# Patient Record
Sex: Female | Born: 1945
Health system: Southern US, Community
[De-identification: ages and names within clinical notes are randomized; demographics above are authoritative.]

## PROBLEM LIST (undated history)

## (undated) DIAGNOSIS — I10 Essential (primary) hypertension: Secondary | ICD-10-CM

## (undated) HISTORY — PX: ABDOMINAL HYSTERECTOMY: SHX81

## (undated) HISTORY — PX: APPENDECTOMY: SHX54

## (undated) HISTORY — PX: ADENOIDECTOMY: SUR15

## (undated) HISTORY — PX: TONSILLECTOMY: SUR1361

---

## 2002-04-16 ENCOUNTER — Emergency Department (HOSPITAL_COMMUNITY): Admission: EM | Admit: 2002-04-16 | Discharge: 2002-04-16 | Payer: Self-pay | Admitting: Emergency Medicine

## 2002-04-16 ENCOUNTER — Encounter: Payer: Self-pay | Admitting: Emergency Medicine

## 2007-08-15 ENCOUNTER — Ambulatory Visit: Payer: Self-pay | Admitting: Internal Medicine

## 2007-08-26 ENCOUNTER — Ambulatory Visit: Payer: Self-pay | Admitting: Internal Medicine

## 2009-03-09 ENCOUNTER — Ambulatory Visit (HOSPITAL_BASED_OUTPATIENT_CLINIC_OR_DEPARTMENT_OTHER): Admission: RE | Admit: 2009-03-09 | Discharge: 2009-03-09 | Payer: Self-pay | Admitting: Pediatrics

## 2009-03-09 ENCOUNTER — Ambulatory Visit: Payer: Self-pay | Admitting: Diagnostic Radiology

## 2011-01-13 ENCOUNTER — Other Ambulatory Visit (HOSPITAL_COMMUNITY): Payer: Self-pay | Admitting: Neurosurgery

## 2011-01-13 DIAGNOSIS — M5416 Radiculopathy, lumbar region: Secondary | ICD-10-CM

## 2011-01-17 ENCOUNTER — Other Ambulatory Visit (HOSPITAL_COMMUNITY): Payer: Self-pay | Admitting: Neurosurgery

## 2011-01-17 ENCOUNTER — Ambulatory Visit (HOSPITAL_COMMUNITY)
Admit: 2011-01-17 | Discharge: 2011-01-17 | Disposition: A | Discharge status: Home / Self Care | Payer: Medicare Other | Attending: Neurosurgery | Admitting: Neurosurgery

## 2011-01-17 ENCOUNTER — Other Ambulatory Visit (HOSPITAL_COMMUNITY): Payer: Medicare Other

## 2011-01-17 ENCOUNTER — Ambulatory Visit (HOSPITAL_COMMUNITY)
Admission: RE | Admit: 2011-01-17 | Discharge: 2011-01-17 | Disposition: A | Discharge status: Home / Self Care | Payer: Medicare Other | Source: Ambulatory Visit | Attending: Neurosurgery | Admitting: Neurosurgery

## 2011-01-17 DIAGNOSIS — M5416 Radiculopathy, lumbar region: Secondary | ICD-10-CM

## 2011-01-17 DIAGNOSIS — M216X9 Other acquired deformities of unspecified foot: Secondary | ICD-10-CM | POA: Insufficient documentation

## 2011-01-17 DIAGNOSIS — M47817 Spondylosis without myelopathy or radiculopathy, lumbosacral region: Secondary | ICD-10-CM | POA: Insufficient documentation

## 2011-01-17 DIAGNOSIS — G609 Hereditary and idiopathic neuropathy, unspecified: Secondary | ICD-10-CM | POA: Insufficient documentation

## 2011-01-17 LAB — BASIC METABOLIC PANEL
BUN: 14 mg/dL (ref 6–23)
CO2: 28 mEq/L (ref 19–32)
Calcium: 9.8 mg/dL (ref 8.4–10.5)
Chloride: 105 mEq/L (ref 96–112)
Creatinine, Ser: 0.63 mg/dL (ref 0.4–1.2)
GFR calc Af Amer: >60 mL/min (ref >60)
GFR calc non Af Amer: >60 mL/min (ref >60)
Glucose, Bld: 110 mg/dL — ABNORMAL HIGH (ref 70–99)
Potassium: 3.5 mEq/L (ref 3.5–5.1)
Sodium: 143 mEq/L (ref 135–145)

## 2011-01-17 LAB — CBC
HCT: 36.1 % (ref 36.0–46.0)
Hemoglobin: 13.1 g/dL (ref 12.0–15.0)
MCH: 29.8 pg (ref 26.0–34.0)
MCHC: 36.3 g/dL — ABNORMAL HIGH (ref 30.0–36.0)
MCV: 82 fL (ref 78.0–100.0)
Platelets: 168 10*3/uL (ref 150–400)
RBC: 4.4 MIL/uL (ref 3.87–5.11)
RDW: 11.8 % (ref 11.5–15.5)
WBC: 5.8 10*3/uL (ref 4.0–10.5)

## 2011-01-17 LAB — DIFFERENTIAL
Basophils Absolute: 0 10*3/uL (ref 0.0–0.1)
Basophils Relative: 1 % (ref 0–1)
Eosinophils Absolute: 0.1 10*3/uL (ref 0.0–0.7)
Eosinophils Relative: 2 % (ref 0–5)
Lymphocytes Relative: 26 % (ref 12–46)
Lymphs Abs: 1.5 10*3/uL (ref 0.7–4.0)
Monocytes Absolute: 0.2 10*3/uL (ref 0.1–1.0)
Monocytes Relative: 4 % (ref 3–12)
Neutro Abs: 4 10*3/uL (ref 1.7–7.7)
Neutrophils Relative %: 68 % (ref 43–77)

## 2011-01-17 LAB — TYPE AND SCREEN
ABO/RH(D): O NEG
Antibody Screen: NEGATIVE

## 2011-01-17 LAB — ABO/RH: ABO/RH(D): O NEG

## 2011-01-17 MED ORDER — IOHEXOL 180 MG/ML  SOLN
25.0000 mL | Freq: Once | INTRAMUSCULAR | Status: AC | PRN
  Administered 2011-01-17: 25 mL via INTRATHECAL

## 2011-01-17 MED ORDER — GADOBENATE DIMEGLUMINE 529 MG/ML IV SOLN
18.0000 mL | Freq: Once | INTRAVENOUS | Status: AC | PRN
  Administered 2011-01-17: 18 mL via INTRAVENOUS

## 2011-01-18 ENCOUNTER — Other Ambulatory Visit (HOSPITAL_COMMUNITY): Payer: Medicare Other

## 2011-03-30 ENCOUNTER — Other Ambulatory Visit: Payer: Self-pay | Admitting: Neurology

## 2011-03-30 DIAGNOSIS — M549 Dorsalgia, unspecified: Secondary | ICD-10-CM

## 2011-05-01 ENCOUNTER — Ambulatory Visit
Admission: RE | Admit: 2011-05-01 | Discharge: 2011-05-01 | Disposition: A | Discharge status: Home / Self Care | Payer: Medicare Other | Source: Ambulatory Visit | Attending: Neurology | Admitting: Neurology

## 2011-05-01 DIAGNOSIS — M549 Dorsalgia, unspecified: Secondary | ICD-10-CM

## 2011-05-01 MED ORDER — GADOBENATE DIMEGLUMINE 529 MG/ML IV SOLN: 20.0000 mL | Freq: Once | INTRAVENOUS | Status: AC | PRN

## 2011-09-26 DIAGNOSIS — M48062 Spinal stenosis, lumbar region with neurogenic claudication: Secondary | ICD-10-CM | POA: Insufficient documentation

## 2015-09-15 DIAGNOSIS — Z1211 Encounter for screening for malignant neoplasm of colon: Secondary | ICD-10-CM | POA: Diagnosis not present

## 2015-09-15 DIAGNOSIS — I10 Essential (primary) hypertension: Secondary | ICD-10-CM | POA: Diagnosis not present

## 2015-09-15 DIAGNOSIS — R05 Cough: Secondary | ICD-10-CM | POA: Diagnosis not present

## 2015-09-15 DIAGNOSIS — E785 Hyperlipidemia, unspecified: Secondary | ICD-10-CM | POA: Diagnosis not present

## 2015-09-15 DIAGNOSIS — E559 Vitamin D deficiency, unspecified: Secondary | ICD-10-CM | POA: Diagnosis not present

## 2015-09-21 DIAGNOSIS — Z1231 Encounter for screening mammogram for malignant neoplasm of breast: Secondary | ICD-10-CM | POA: Diagnosis not present

## 2015-09-28 DIAGNOSIS — I10 Essential (primary) hypertension: Secondary | ICD-10-CM | POA: Diagnosis not present

## 2015-09-30 DIAGNOSIS — E785 Hyperlipidemia, unspecified: Secondary | ICD-10-CM | POA: Diagnosis not present

## 2015-09-30 DIAGNOSIS — R8290 Unspecified abnormal findings in urine: Secondary | ICD-10-CM | POA: Diagnosis not present

## 2015-10-14 DIAGNOSIS — M5137 Other intervertebral disc degeneration, lumbosacral region: Secondary | ICD-10-CM | POA: Diagnosis not present

## 2015-10-14 DIAGNOSIS — G609 Hereditary and idiopathic neuropathy, unspecified: Secondary | ICD-10-CM | POA: Diagnosis not present

## 2016-01-13 DIAGNOSIS — M5137 Other intervertebral disc degeneration, lumbosacral region: Secondary | ICD-10-CM | POA: Diagnosis not present

## 2016-01-13 DIAGNOSIS — G609 Hereditary and idiopathic neuropathy, unspecified: Secondary | ICD-10-CM | POA: Diagnosis not present

## 2016-01-14 DIAGNOSIS — M21372 Foot drop, left foot: Secondary | ICD-10-CM | POA: Diagnosis not present

## 2016-01-14 DIAGNOSIS — G609 Hereditary and idiopathic neuropathy, unspecified: Secondary | ICD-10-CM | POA: Diagnosis not present

## 2016-03-13 DIAGNOSIS — I10 Essential (primary) hypertension: Secondary | ICD-10-CM | POA: Diagnosis not present

## 2016-03-13 DIAGNOSIS — E785 Hyperlipidemia, unspecified: Secondary | ICD-10-CM | POA: Diagnosis not present

## 2016-03-13 DIAGNOSIS — E669 Obesity, unspecified: Secondary | ICD-10-CM | POA: Diagnosis not present

## 2016-03-13 DIAGNOSIS — R21 Rash and other nonspecific skin eruption: Secondary | ICD-10-CM | POA: Diagnosis not present

## 2016-04-18 DIAGNOSIS — H2513 Age-related nuclear cataract, bilateral: Secondary | ICD-10-CM | POA: Diagnosis not present

## 2016-04-18 DIAGNOSIS — H02831 Dermatochalasis of right upper eyelid: Secondary | ICD-10-CM | POA: Diagnosis not present

## 2016-04-18 DIAGNOSIS — H43393 Other vitreous opacities, bilateral: Secondary | ICD-10-CM | POA: Diagnosis not present

## 2016-04-18 DIAGNOSIS — H5203 Hypermetropia, bilateral: Secondary | ICD-10-CM | POA: Diagnosis not present

## 2016-05-04 DIAGNOSIS — M4806 Spinal stenosis, lumbar region: Secondary | ICD-10-CM | POA: Diagnosis not present

## 2016-05-04 DIAGNOSIS — M545 Low back pain: Secondary | ICD-10-CM | POA: Diagnosis not present

## 2016-05-04 DIAGNOSIS — G959 Disease of spinal cord, unspecified: Secondary | ICD-10-CM | POA: Diagnosis not present

## 2016-05-04 DIAGNOSIS — G609 Hereditary and idiopathic neuropathy, unspecified: Secondary | ICD-10-CM | POA: Diagnosis not present

## 2016-07-06 DIAGNOSIS — L97512 Non-pressure chronic ulcer of other part of right foot with fat layer exposed: Secondary | ICD-10-CM | POA: Diagnosis not present

## 2016-07-20 DIAGNOSIS — L97512 Non-pressure chronic ulcer of other part of right foot with fat layer exposed: Secondary | ICD-10-CM | POA: Diagnosis not present

## 2016-07-20 DIAGNOSIS — G629 Polyneuropathy, unspecified: Secondary | ICD-10-CM | POA: Diagnosis not present

## 2016-08-09 DIAGNOSIS — M545 Low back pain: Secondary | ICD-10-CM | POA: Diagnosis not present

## 2016-08-09 DIAGNOSIS — G609 Hereditary and idiopathic neuropathy, unspecified: Secondary | ICD-10-CM | POA: Diagnosis not present

## 2016-08-09 DIAGNOSIS — M25551 Pain in right hip: Secondary | ICD-10-CM | POA: Diagnosis not present

## 2016-08-09 DIAGNOSIS — M48062 Spinal stenosis, lumbar region with neurogenic claudication: Secondary | ICD-10-CM | POA: Diagnosis not present

## 2016-08-09 DIAGNOSIS — S79911A Unspecified injury of right hip, initial encounter: Secondary | ICD-10-CM | POA: Diagnosis not present

## 2016-08-15 DIAGNOSIS — I10 Essential (primary) hypertension: Secondary | ICD-10-CM | POA: Diagnosis not present

## 2016-08-15 DIAGNOSIS — R269 Unspecified abnormalities of gait and mobility: Secondary | ICD-10-CM | POA: Diagnosis not present

## 2016-08-15 DIAGNOSIS — H6121 Impacted cerumen, right ear: Secondary | ICD-10-CM | POA: Diagnosis not present

## 2016-08-15 DIAGNOSIS — Z1159 Encounter for screening for other viral diseases: Secondary | ICD-10-CM | POA: Diagnosis not present

## 2016-08-15 DIAGNOSIS — Z0000 Encounter for general adult medical examination without abnormal findings: Secondary | ICD-10-CM | POA: Diagnosis not present

## 2016-08-15 DIAGNOSIS — E785 Hyperlipidemia, unspecified: Secondary | ICD-10-CM | POA: Diagnosis not present

## 2016-08-18 DIAGNOSIS — R269 Unspecified abnormalities of gait and mobility: Secondary | ICD-10-CM | POA: Diagnosis not present

## 2016-08-22 DIAGNOSIS — R2681 Unsteadiness on feet: Secondary | ICD-10-CM | POA: Diagnosis not present

## 2016-08-22 DIAGNOSIS — M545 Low back pain: Secondary | ICD-10-CM | POA: Diagnosis not present

## 2016-08-23 DIAGNOSIS — M5137 Other intervertebral disc degeneration, lumbosacral region: Secondary | ICD-10-CM | POA: Diagnosis not present

## 2016-09-05 DIAGNOSIS — R2681 Unsteadiness on feet: Secondary | ICD-10-CM | POA: Diagnosis not present

## 2016-09-05 DIAGNOSIS — M545 Low back pain: Secondary | ICD-10-CM | POA: Diagnosis not present

## 2016-09-14 DIAGNOSIS — M5137 Other intervertebral disc degeneration, lumbosacral region: Secondary | ICD-10-CM | POA: Diagnosis not present

## 2016-09-14 DIAGNOSIS — Z79899 Other long term (current) drug therapy: Secondary | ICD-10-CM | POA: Diagnosis not present

## 2016-09-14 DIAGNOSIS — G609 Hereditary and idiopathic neuropathy, unspecified: Secondary | ICD-10-CM | POA: Diagnosis not present

## 2016-09-14 DIAGNOSIS — M48062 Spinal stenosis, lumbar region with neurogenic claudication: Secondary | ICD-10-CM | POA: Diagnosis not present

## 2016-09-26 DIAGNOSIS — R2681 Unsteadiness on feet: Secondary | ICD-10-CM | POA: Diagnosis not present

## 2016-09-26 DIAGNOSIS — R531 Weakness: Secondary | ICD-10-CM | POA: Diagnosis not present

## 2016-09-26 DIAGNOSIS — R293 Abnormal posture: Secondary | ICD-10-CM | POA: Diagnosis not present

## 2016-09-26 DIAGNOSIS — M545 Low back pain: Secondary | ICD-10-CM | POA: Diagnosis not present

## 2016-09-26 DIAGNOSIS — Z7409 Other reduced mobility: Secondary | ICD-10-CM | POA: Diagnosis not present

## 2016-09-26 DIAGNOSIS — R2689 Other abnormalities of gait and mobility: Secondary | ICD-10-CM | POA: Diagnosis not present

## 2016-10-24 DIAGNOSIS — R2689 Other abnormalities of gait and mobility: Secondary | ICD-10-CM | POA: Diagnosis not present

## 2016-10-24 DIAGNOSIS — M545 Low back pain: Secondary | ICD-10-CM | POA: Diagnosis not present

## 2016-10-31 DIAGNOSIS — Z1231 Encounter for screening mammogram for malignant neoplasm of breast: Secondary | ICD-10-CM | POA: Diagnosis not present

## 2016-11-28 DIAGNOSIS — I10 Essential (primary) hypertension: Secondary | ICD-10-CM | POA: Diagnosis not present

## 2016-11-28 DIAGNOSIS — E785 Hyperlipidemia, unspecified: Secondary | ICD-10-CM | POA: Diagnosis not present

## 2016-11-28 DIAGNOSIS — Z1159 Encounter for screening for other viral diseases: Secondary | ICD-10-CM | POA: Diagnosis not present

## 2016-11-29 DIAGNOSIS — I10 Essential (primary) hypertension: Secondary | ICD-10-CM | POA: Diagnosis not present

## 2016-12-07 DIAGNOSIS — Z79899 Other long term (current) drug therapy: Secondary | ICD-10-CM | POA: Diagnosis not present

## 2016-12-07 DIAGNOSIS — M48062 Spinal stenosis, lumbar region with neurogenic claudication: Secondary | ICD-10-CM | POA: Diagnosis not present

## 2016-12-07 DIAGNOSIS — M545 Low back pain: Secondary | ICD-10-CM | POA: Diagnosis not present

## 2016-12-28 DIAGNOSIS — L89301 Pressure ulcer of unspecified buttock, stage 1: Secondary | ICD-10-CM | POA: Diagnosis not present

## 2016-12-28 DIAGNOSIS — L304 Erythema intertrigo: Secondary | ICD-10-CM | POA: Diagnosis not present

## 2016-12-28 DIAGNOSIS — L570 Actinic keratosis: Secondary | ICD-10-CM | POA: Diagnosis not present

## 2017-01-05 DIAGNOSIS — G720 Drug-induced myopathy: Secondary | ICD-10-CM | POA: Diagnosis not present

## 2017-01-05 DIAGNOSIS — M48062 Spinal stenosis, lumbar region with neurogenic claudication: Secondary | ICD-10-CM | POA: Diagnosis not present

## 2017-01-05 DIAGNOSIS — M545 Low back pain: Secondary | ICD-10-CM | POA: Diagnosis not present

## 2017-01-05 DIAGNOSIS — M5416 Radiculopathy, lumbar region: Secondary | ICD-10-CM | POA: Diagnosis not present

## 2017-02-09 DIAGNOSIS — I10 Essential (primary) hypertension: Secondary | ICD-10-CM | POA: Diagnosis not present

## 2017-02-12 DIAGNOSIS — E785 Hyperlipidemia, unspecified: Secondary | ICD-10-CM | POA: Diagnosis not present

## 2017-02-12 DIAGNOSIS — L89301 Pressure ulcer of unspecified buttock, stage 1: Secondary | ICD-10-CM | POA: Diagnosis not present

## 2017-02-12 DIAGNOSIS — Z23 Encounter for immunization: Secondary | ICD-10-CM | POA: Diagnosis not present

## 2017-02-12 DIAGNOSIS — I10 Essential (primary) hypertension: Secondary | ICD-10-CM | POA: Diagnosis not present

## 2017-02-12 DIAGNOSIS — R269 Unspecified abnormalities of gait and mobility: Secondary | ICD-10-CM | POA: Diagnosis not present

## 2017-02-12 DIAGNOSIS — D649 Anemia, unspecified: Secondary | ICD-10-CM | POA: Diagnosis not present

## 2017-02-12 DIAGNOSIS — D519 Vitamin B12 deficiency anemia, unspecified: Secondary | ICD-10-CM | POA: Diagnosis not present

## 2017-02-19 ENCOUNTER — Observation Stay (HOSPITAL_COMMUNITY)
Admission: EM | Admit: 2017-02-19 | Discharge: 2017-02-21 | Disposition: A | Discharge status: Home / Self Care | Payer: PPO | Attending: Family Medicine | Admitting: Family Medicine

## 2017-02-19 ENCOUNTER — Emergency Department (HOSPITAL_COMMUNITY): Payer: PPO

## 2017-02-19 ENCOUNTER — Encounter (HOSPITAL_COMMUNITY): Payer: Self-pay | Admitting: Family Medicine

## 2017-02-19 DIAGNOSIS — M549 Dorsalgia, unspecified: Secondary | ICD-10-CM

## 2017-02-19 DIAGNOSIS — Y939 Activity, unspecified: Secondary | ICD-10-CM | POA: Insufficient documentation

## 2017-02-19 DIAGNOSIS — Y92009 Unspecified place in unspecified non-institutional (private) residence as the place of occurrence of the external cause: Secondary | ICD-10-CM | POA: Diagnosis not present

## 2017-02-19 DIAGNOSIS — M545 Low back pain, unspecified: Secondary | ICD-10-CM | POA: Diagnosis present

## 2017-02-19 DIAGNOSIS — T148XXA Other injury of unspecified body region, initial encounter: Secondary | ICD-10-CM | POA: Diagnosis not present

## 2017-02-19 DIAGNOSIS — Y999 Unspecified external cause status: Secondary | ICD-10-CM | POA: Insufficient documentation

## 2017-02-19 DIAGNOSIS — I10 Essential (primary) hypertension: Secondary | ICD-10-CM | POA: Diagnosis present

## 2017-02-19 DIAGNOSIS — R52 Pain, unspecified: Secondary | ICD-10-CM

## 2017-02-19 DIAGNOSIS — Z79899 Other long term (current) drug therapy: Secondary | ICD-10-CM | POA: Insufficient documentation

## 2017-02-19 DIAGNOSIS — M25551 Pain in right hip: Secondary | ICD-10-CM | POA: Diagnosis not present

## 2017-02-19 DIAGNOSIS — S3992XA Unspecified injury of lower back, initial encounter: Secondary | ICD-10-CM | POA: Diagnosis not present

## 2017-02-19 DIAGNOSIS — M79604 Pain in right leg: Secondary | ICD-10-CM | POA: Diagnosis not present

## 2017-02-19 DIAGNOSIS — M79606 Pain in leg, unspecified: Secondary | ICD-10-CM | POA: Diagnosis present

## 2017-02-19 DIAGNOSIS — G629 Polyneuropathy, unspecified: Secondary | ICD-10-CM | POA: Insufficient documentation

## 2017-02-19 DIAGNOSIS — W06XXXA Fall from bed, initial encounter: Secondary | ICD-10-CM | POA: Insufficient documentation

## 2017-02-19 DIAGNOSIS — G8929 Other chronic pain: Secondary | ICD-10-CM | POA: Diagnosis present

## 2017-02-19 DIAGNOSIS — W19XXXA Unspecified fall, initial encounter: Secondary | ICD-10-CM

## 2017-02-19 DIAGNOSIS — S79911A Unspecified injury of right hip, initial encounter: Secondary | ICD-10-CM | POA: Diagnosis not present

## 2017-02-19 HISTORY — DX: Essential (primary) hypertension: I10

## 2017-02-19 LAB — CBC
HCT: 31.6 % — ABNORMAL LOW (ref 36.0–46.0)
HEMOGLOBIN: 11.1 g/dL — AB (ref 12.0–15.0)
MCH: 30 pg (ref 26.0–34.0)
MCHC: 35.1 g/dL (ref 30.0–36.0)
MCV: 85.4 fL (ref 78.0–100.0)
Platelets: 183 10*3/uL (ref 150–400)
RBC: 3.7 MIL/uL — ABNORMAL LOW (ref 3.87–5.11)
RDW: 12.8 % (ref 11.5–15.5)
WBC: 7.7 10*3/uL (ref 4.0–10.5)

## 2017-02-19 LAB — BASIC METABOLIC PANEL
Anion gap: 9 (ref 5–15)
BUN: 23 mg/dL — AB (ref 6–20)
CHLORIDE: 104 mmol/L (ref 101–111)
CO2: 25 mmol/L (ref 22–32)
Calcium: 8.8 mg/dL — ABNORMAL LOW (ref 8.9–10.3)
Creatinine, Ser: 0.7 mg/dL (ref 0.44–1.00)
GFR calc Af Amer: >60 mL/min (ref >60)
GFR calc non Af Amer: >60 mL/min (ref >60)
GLUCOSE: 104 mg/dL — AB (ref 65–99)
Potassium: 3.7 mmol/L (ref 3.5–5.1)
SODIUM: 138 mmol/L (ref 135–145)

## 2017-02-19 MED ORDER — HYDROMORPHONE HCL 2 MG PO TABS
2.0000 mg | ORAL_TABLET | Freq: Once | ORAL | Status: AC
  Administered 2017-02-19: 2 mg via ORAL
  Filled 2017-02-19: qty 1

## 2017-02-19 MED ORDER — SODIUM CHLORIDE 0.9 % IV BOLUS (SEPSIS)
1000.0000 mL | Freq: Once | INTRAVENOUS | Status: AC
  Administered 2017-02-19: 1000 mL via INTRAVENOUS

## 2017-02-19 MED ORDER — GABAPENTIN 300 MG PO CAPS
900.0000 mg | ORAL_CAPSULE | Freq: Three times a day (TID) | ORAL | Status: DC
  Administered 2017-02-20: 900 mg via ORAL
  Filled 2017-02-19: qty 3

## 2017-02-19 MED ORDER — LISINOPRIL 20 MG PO TABS
20.0000 mg | ORAL_TABLET | Freq: Every day | ORAL | Status: DC
  Administered 2017-02-20 – 2017-02-21 (×2): 20 mg via ORAL
  Filled 2017-02-19 (×2): qty 1

## 2017-02-19 MED ORDER — DULOXETINE HCL 60 MG PO CPEP
60.0000 mg | ORAL_CAPSULE | Freq: Two times a day (BID) | ORAL | Status: DC
  Administered 2017-02-20 – 2017-02-21 (×3): 60 mg via ORAL
  Filled 2017-02-19: qty 2
  Filled 2017-02-19 (×2): qty 1

## 2017-02-19 MED ORDER — MORPHINE SULFATE (PF) 4 MG/ML IV SOLN
4.0000 mg | Freq: Once | INTRAVENOUS | Status: AC
  Administered 2017-02-19: 4 mg via INTRAVENOUS
  Filled 2017-02-19: qty 1

## 2017-02-19 MED ORDER — TIZANIDINE HCL 4 MG PO TABS
4.0000 mg | ORAL_TABLET | Freq: Two times a day (BID) | ORAL | Status: DC | PRN
  Administered 2017-02-20: 4 mg via ORAL
  Filled 2017-02-19: qty 1

## 2017-02-19 MED ORDER — HYDROMORPHONE HCL 2 MG PO TABS
4.0000 mg | ORAL_TABLET | ORAL | Status: DC | PRN
  Administered 2017-02-20: 4 mg via ORAL
  Administered 2017-02-20: 6 mg via ORAL
  Administered 2017-02-20: 4 mg via ORAL
  Administered 2017-02-20 – 2017-02-21 (×5): 6 mg via ORAL
  Filled 2017-02-19 (×2): qty 3
  Filled 2017-02-19: qty 2
  Filled 2017-02-19 (×4): qty 3
  Filled 2017-02-19: qty 2

## 2017-02-19 MED ORDER — ADULT MULTIVITAMIN W/MINERALS CH
1.0000 | ORAL_TABLET | Freq: Every day | ORAL | Status: DC
  Administered 2017-02-20 – 2017-02-21 (×2): 1 via ORAL
  Filled 2017-02-19 (×2): qty 1

## 2017-02-19 MED ORDER — VITAMIN D3 25 MCG (1000 UNIT) PO TABS
1000.0000 [IU] | ORAL_TABLET | Freq: Every day | ORAL | Status: DC
  Administered 2017-02-20 – 2017-02-21 (×2): 1000 [IU] via ORAL
  Filled 2017-02-19 (×2): qty 1

## 2017-02-19 MED ORDER — CARBAMAZEPINE 200 MG PO TABS
200.0000 mg | ORAL_TABLET | Freq: Three times a day (TID) | ORAL | Status: DC
  Administered 2017-02-20 – 2017-02-21 (×4): 200 mg via ORAL
  Filled 2017-02-19 (×4): qty 1

## 2017-02-19 MED ORDER — OMEGA-3-ACID ETHYL ESTERS 1 G PO CAPS
1.0000 g | ORAL_CAPSULE | Freq: Two times a day (BID) | ORAL | Status: DC
  Administered 2017-02-20 – 2017-02-21 (×3): 1 g via ORAL
  Filled 2017-02-19 (×5): qty 1

## 2017-02-19 MED ORDER — ONDANSETRON HCL 4 MG/2ML IJ SOLN
4.0000 mg | Freq: Once | INTRAMUSCULAR | Status: AC
  Administered 2017-02-19: 4 mg via INTRAVENOUS
  Filled 2017-02-19: qty 2

## 2017-02-19 NOTE — Progress Notes (Addendum)
Pt and family were hesitant to return home and repeatedly state their desire to be admitted inpatient and placed at a SNF for short-term rehab.  CSW, CM and EDP all separately counseled pt and family about pt not meeting inpatient criteria.  CSW offered to create FL-2 while pt and family were awaiting results of CT scan confirming pt is able to return home. Pt's family verbally gave permission for CSW to send out referrals and FL-2 on pt's behalf to facilities of their choice on the hub before D/C.  CSW obtained pt's PASSR - [id number].  Pt and family have been counseled they can reach out to facilities of their choice once D/C'd and facilities will be familiar with the pt.   Dorothe Pea. Wrenna Saks, Theresia Majors, LCAS Clinical Social Worker Ph: [phone]

## 2017-02-19 NOTE — Care Management Note (Signed)
Case Management Note  Patient Details  Name: Theresa Flowers MRN: [id number] Date of Birth: 1946/04/15  Subjective/Objective:                  ED spoke with the patient and her family at the bedside. Patient states she wants to be admitted so that she can be placed in a SNF for rehab. She is unsure whether or not she wants [facility] when discharged. Her visits are $45 per visit. Reports she had 3 months of OP PT which she and her son state made her condition worse. She lives in a tri-level home. States she has 2 steps to get inside then 5 steps to get down into the family room. She does have a half bath downstairs and 2 full bathrooms on the upper level. She states she can use the recliner in her family room if she is discharged home from the ED. CSW discussed the possibility of a hospital bed with the patient. She would place a hospital bed on the main level but will need to go down 5 stairs or up 7 stairs to get a bathroom. She would need to go up stairs to shower. She is hopeful the "MR" she will have while hereI will reveal something allowing her to be admitted. She states she has peripheral neuropathy in her feet, lower legs and hands. Her son states they have been dealing with the mobility issues and falls for a long time. ED CM provided the patient and her family with a list of home health providers serving [facility]. Patient states she also has LTC insurance that can help cover placement and possibly home health.   Action/Plan:   Expected Discharge Date:                  Expected Discharge Plan:     In-House Referral:     Discharge planning Services     Post Acute Care Choice:    Choice offered to:     DME Arranged:    DME Agency:     HH Arranged:    HH Agency:     Status of Service:     If discussed at [employer] of Stay Meetings, dates discussed:    Additional Comments:  Antony Haste, RN 02/19/2017, 9:03 PM

## 2017-02-19 NOTE — Progress Notes (Signed)
CSW received call from EDP that pt's are now refusing to leave.  CSW left the pt's room earlier and pt and family were in agreement with the plan that they will return home and follow up with SNF's they have chosen.  Earlier, pt and family turned down Home Health options offered to them by the CM stating they need SNF, but understood verbally they would have to D/C due to not meeting inpatient criteria. EDP informed CSW pt and family reported they had no idea "what is going on" due to only now getting results from CT scan which indicated no fracture.  CSW will speak to family and inform them they may be liable to pay for their overnight stay if they refuse to D/C.  Dorothe Pea. Grayling Schranz, Theresia Majors, LCAS Clinical Social Worker Ph: [phone]

## 2017-02-19 NOTE — ED Triage Notes (Addendum)
Patient is from home and transported via [facility]. Patient had a fall earlier this morning at 5:00am with no transport to the facility. Patient fall with no LOC or hitting head.This afternoon, EMS was called out due to not being able to get out of bed. Patient was placed on collar. Patient is complaining of right buttock and leg pain.

## 2017-02-19 NOTE — Discharge Instructions (Signed)
Return to the ED with any concerns including fever/chills, difficulty breathing, worsening pain, not able to urinate, decreased level of alertness/lethargy, or any other alarming symptoms  The initiating paperwork for skilled nursing facility was sent by our social worker to the facilities that he discussed with you.  Please followup by phone with those facilities tomorrow to check about placement.

## 2017-02-19 NOTE — ED Notes (Signed)
Patients family called out requesting additional pain medication, informed provider and waiting on mediation order.

## 2017-02-19 NOTE — ED Provider Notes (Signed)
[facility]-EMERGENCY DEPT Provider Note   CSN: [id number] Arrival date & time: 02/19/17  1548     History   Chief Complaint Chief Complaint  Patient presents with  . Fall    HPI Theresa Flowers is a 71 y.o. female.  HPI  Pt with hx of hypertension, peripheral neuropathy presenting after fall.  She states she fell from her bed this morning.  She denies hitting her head, EMS was called and helped her get back into bed.  Throughout the day today she was having pain in her low back pain right hip.  She has signficant peripheral neuropathy and does not normally have sensation below her knees bilaterally- uses leg braces and is not very mobile or able to walk well at her baseline.  However today the pain in her right hip made walking more difficult.  She denies neck pain.  There are no other associated systemic symptoms, there are no other alleviating or modifying factors.   Past Medical History:  Diagnosis Date  . Hypertension     There are no active problems to display for this patient.   Past Surgical History:  Procedure Laterality Date  . ABDOMINAL HYSTERECTOMY    . ADENOIDECTOMY    . APPENDECTOMY    . TONSILLECTOMY      OB History    No data available       Home Medications    Prior to Admission medications   Medication Sig Start Date End Date Taking? Authorizing Provider  carbamazepine (TEGRETOL) 200 MG tablet Take 200 mg by mouth 3 (three) times daily.   Yes [provider]  Cholecalciferol (VITAMIN D3 PO) Take 1 tablet by mouth daily.   Yes [provider]  CRANBERRY PO Take 1 tablet by mouth daily.   Yes [provider]  DULoxetine (CYMBALTA) 30 MG capsule Take 60 mg by mouth 2 (two) times daily.   Yes [provider]  gabapentin (NEURONTIN) 300 MG capsule Take 900-1,200 mg by mouth 3 (three) times daily. Take 900mg  by mouth twice daily, and take 1200mg  by mouth at bedtime 11/25/16  Yes [provider]  HYDROmorphone  (DILAUDID) 2 MG tablet Take 4-6 mg by mouth every 4 (four) hours as needed for severe pain.   Yes [provider]  lisinopril (PRINIVIL,ZESTRIL) 20 MG tablet Take 20 mg by mouth daily.   Yes [provider]  Multiple Vitamin (MULTIVITAMIN WITH MINERALS) TABS tablet Take 1 tablet by mouth daily.   Yes [provider]  Omega-3 Fatty Acids (FISH OIL PO) Take 1 capsule by mouth 2 (two) times daily.   Yes [provider]  tiZANidine (ZANAFLEX) 4 MG tablet Take 4-6 mg by mouth 2 (two) times daily as needed for muscle spasms.   Yes [provider]  TURMERIC PO Take 1 tablet by mouth daily.   Yes [provider]    Family History History reviewed. No pertinent family history.  Social History Social History  Substance Use Topics  . Smoking status: Never Smoker  . Smokeless tobacco: Never Used  . Alcohol use No     Allergies   Patient has no allergy information on record.   Review of Systems Review of Systems  ROS reviewed and all otherwise negative except for mentioned in HPI   Physical Exam Updated Vital Signs BP (!) 151/76 (BP Location: Left Arm)   Pulse 82   Temp 98.2 F (36.8 C) (Oral)   Resp 20   Ht 5\' 9"  (  1.753 m)   Wt 84.8 kg (187 lb)   SpO2 94%   BMI 27.62 kg/m  Vitals reviewed Physical Exam Physical Examination: General appearance - alert, well appearing, and in no distress Mental status - alert, oriented to person, place, and time Eyes - no conjunctival injection, no scleral icterus Head- NCAT Neck - supple, no significant adenopathy, no midline tenderness to palpation, FROM without pain, c-collar cleared clinically by me Chest - clear to auscultation, no wheezes, rales or rhonchi, symmetric air entry Heart - normal rate, regular rhythm, normal S1, S2, no murmurs, rubs, clicks or gallops Abdomen - soft, nontender, nondistended, no masses or organomegaly, pelvis stable and nontender to palpatioin Back exam - no  midline tenderness to palpation, bilateral paraspinal tenderness to palpation of lumbar region- equal on both sides per patient Neurological - alert, oriented x 3, normal speech, chronic lack of sensation below kness bilaterally, legs in braces, not able to assess strength in lower extremities due to chronic condition/pain Musculoskeletal - ttp over lateral right hip, pain with minimal attempts to range right hip, otherwise no joint tenderness, deformity or swelling Extremities - peripheral pulses normal, no pedal edema, no clubbing or cyanosis Skin - normal coloration and turgor, no rashes  ED Treatments / Results  Labs (all labs ordered are listed, but only abnormal results are displayed) Labs Reviewed  CBC - Abnormal; Notable for the following:       Result Value   RBC 3.70 (*)    Hemoglobin 11.1 (*)    HCT 31.6 (*)    All other components within normal limits  BASIC METABOLIC PANEL - Abnormal; Notable for the following:    Glucose, Bld 104 (*)    BUN 23 (*)    Calcium 8.8 (*)    All other components within normal limits    EKG  EKG Interpretation None       Radiology Dg Lumbar Spine Complete  Result Date: 02/19/2017 CLINICAL DATA:  71 year old female with fall from bed presenting with low back pain. EXAM: LUMBAR SPINE - COMPLETE 4+ VIEW COMPARISON:  Lumbar spine CT dated 01/17/2011 FINDINGS: There is no acute fracture or subluxation of the lumbar spine. There multilevel degenerative changes with endplate irregularity, disc space narrowing, and multilevel disc desiccation with vacuum phenomena. There is grade 1 L4-L5 anterolisthesis. Multilevel facet hypertrophy noted. The visualized transverse and spinous processes appear intact. Moderate stool noted in the visualized colon. The soft tissues are otherwise unremarkable. IMPRESSION: 1. No acute/traumatic lumbar spine pathology. 2. Multilevel degenerative changes. Electronically Signed   By: Elgie Collard M.D.   On: 02/19/2017  19:12   Ct Lumbar Spine Wo Contrast  Result Date: 02/19/2017 CLINICAL DATA:  Larey Seat this morning, no loss of consciousness. Severe low back pain radiating to legs. EXAM: CT LUMBAR SPINE WITHOUT CONTRAST TECHNIQUE: Multidetector CT imaging of the lumbar spine was performed without intravenous contrast administration. Multiplanar CT image reconstructions were also generated. COMPARISON:  On lumbar spine radiographs February 19, 2017 at 1835 hours and CT lumbar spine Jan 17, 2011 FINDINGS: SEGMENTATION: For the purposes of this report the last well-formed intervertebral disc space is reported as L5-S1. ALIGNMENT: Maintained lumbar lordosis. Minimal grade 1 L4-5 anterolisthesis. No spondylolysis. Mild broad levoscoliosis on this nonweightbearing examination. VERTEBRAE: Vertebral bodies and posterior elements are intact. Severe T12-L1 thru L3-4 disc height loss is unchanged. Moderate to severe L4-5 and L5-S1 disc height loss with vacuum disc all lumbar levels. No destructive bony lesions. PARASPINAL AND OTHER SOFT TISSUES:  Layering density in the gallbladder can be seen with sludge and, vicarious excretion of contrast. DISC LEVELS: T11-12: Small broad-based disc osteophyte complex, mild facet arthropathy and ligamentum flavum redundancy without canal stenosis. Severe bilateral neural foraminal narrowing. T12-L1: Moderate broad-based disc osteophyte complex. subcentimeter probable disc fragment LEFT lateral recess at T12 may affect the traversing LEFT L1 nerve. Moderate broad-based disc osteophyte complex. Moderate facet arthropathy and ligamentum flavum redundancy. Moderate canal stenosis. Moderate severe neural foraminal narrowing. L1-2: Small broad-based disc osteophyte complex, mild facet arthropathy and ligamentum flavum redundancy. No canal stenosis. Severe deer bilateral neural foraminal narrowing. L2-3: Small broad-based disc osteophyte complex asymmetric to the RIGHT. Moderate facet arthropathy on the RIGHT, along  LEFT. No canal stenosis. Moderate to severe RIGHT, moderate LEFT neural foraminal narrowing. L3-4: Small broad-based disc osteophyte complex. Moderate to severe facet arthropathy and ligamentum flavum redundancy. Mild canal stenosis. Severe RIGHT, moderate LEFT neural foraminal narrowing. L4-5: Anterolisthesis. Moderate broad-based disc osteophyte complex. Severe facet arthropathy and ligamentum flavum redundancy. Moderate canal stenosis. Moderate to severe bilateral neural foraminal narrowing. L5-S1: Small broad-based disc osteophyte complex. Moderate to severe facet arthropathy and ligamentum flavum redundancy without canal stenosis. Severe bilateral neural foraminal narrowing. IMPRESSION: Progressed degenerative change of the lumbar spine, grade 1 L4-5 anterolisthesis without spondylolysis. No acute fracture. Probable disc fragment ventral epidural space at T12, new from prior CT though, may not be acute. Fragment may affect the traversing LEFT L1 nerve. Moderate canal stenosis T12-L1 and L4-5. Multilevel severe neural foraminal narrowing. Electronically Signed   By: Awilda Metro M.D.   On: 02/19/2017 21:56   Ct Hip Right Wo Contrast  Result Date: 02/19/2017 CLINICAL DATA:  71 year old female with fall and right hip pain. EXAM: CT OF THE RIGHT HIP WITHOUT CONTRAST TECHNIQUE: Multidetector CT imaging of the right hip was performed according to the standard protocol. Multiplanar CT image reconstructions were also generated. COMPARISON:  Radiograph dated 02/19/2017 FINDINGS: Bones/Joint/Cartilage No acute fracture. The bones are osteopenic. No dislocation. No significant degenerative changes. Ligaments Suboptimally assessed by CT. Muscles and Tendons No acute findings.  No intramuscular hematoma. Soft tissues Mild stranding of the right gluteal subcutaneous fat. IMPRESSION: No acute fracture or dislocation. Electronically Signed   By: Elgie Collard M.D.   On: 02/19/2017 22:09   Dg Hip Unilat W Or Wo  Pelvis 2-3 Views Right  Result Date: 02/19/2017 CLINICAL DATA:  71 year old who fell out of bed this morning and complains of low back pain and right hip pain. Initial encounter. EXAM: DG HIP (WITH OR WITHOUT PELVIS) 2-3V RIGHT COMPARISON:  None. FINDINGS: No evidence of acute fracture or dislocation. Well-preserved joint space. Well-preserved bone mineral density for age. Small benign bone island involving the right femoral head. Included AP pelvis demonstrates a normal-appearing contralateral left hip joint. Sacroiliac joints and symphysis pubis intact. Degenerative changes involving the visualized lower lumbar spine. IMPRESSION: No acute osseous abnormality. Electronically Signed   By: Hulan Saas M.D.   On: 02/19/2017 19:10    Procedures Procedures (including critical care time)  Medications Ordered in ED Medications  carbamazepine (TEGRETOL) tablet 200 mg (not administered)  cholecalciferol (VITAMIN D) tablet 1,000 Units (not administered)  DULoxetine (CYMBALTA) DR capsule 60 mg (not administered)  gabapentin (NEURONTIN) capsule 900-1,200 mg (not administered)  HYDROmorphone (DILAUDID) tablet 4-6 mg (not administered)  lisinopril (PRINIVIL,ZESTRIL) tablet 20 mg (not administered)  multivitamin with minerals tablet 1 tablet (not administered)  omega-3 acid ethyl esters (LOVAZA) capsule 1 g (not administered)  tiZANidine (ZANAFLEX) tablet 4-6  mg (not administered)  sodium chloride 0.9 % bolus 1,000 mL (0 mLs Intravenous Stopped 02/19/17 1916)  morphine 4 MG/ML injection 4 mg (4 mg Intravenous Given 02/19/17 1747)  ondansetron (ZOFRAN) injection 4 mg (4 mg Intravenous Given 02/19/17 1746)  morphine 4 MG/ML injection 4 mg (4 mg Intravenous Given 02/19/17 1923)  HYDROmorphone (DILAUDID) tablet 2 mg (2 mg Oral Given 02/19/17 2257)     Initial Impression / Assessment and Plan / ED Course  I have reviewed the triage vital signs and the nursing notes.  Pertinent labs & imaging results that  were available during my care of the patient were reviewed by me and considered in my medical decision making (see chart for details).    7:48 PM no fractures on xray.  Pt continues to be in pain, she has baseline signfiicant peripheral neuropathy causing significant mobility problems even before the fall.  Family feels they cannot care for her at home.  Will obtain CT hip to continue to evaluate for fracture.  Have consulted to social work to begin process of finding what help can be obtained for care of patient if there is no fracture.  Pain is improved after morphine.    9:47 PM social worker has talked to patient and family, CT is being done now.  Will reassess after CT results obtained.    10:02 PM social work has filled out an [employer] as family has some ideas about facilities where they would like patient to go. Per social work they are satisfied with plan for getting placed in facility from home and will contact facilities tomorrow.   Awaiting CT results.    10:51 PM  CT shows no fracture, degenerative changes of lumbar spine.  Discussed results with patient and family at bedside.  They are very upset and state they are not happy with plan for discharge tonight because they are not able to care for her at home.  They became angry at the suggestion they would be discharged tonight. I have contacted social work again and he states that he will come talk to them again, that they did agree to the plan for discharge as stated before, but due to their very different reaction when I talked with them, he will come talk with them again.  They unfortunately do not meet inpatient criteria, if needed social work will help with placement in the morning.  .   I have ordered patient's home meds including her home dilaudid dose in case she is here overnight- pain had been controlled with IV morphine earlier in the evening.   11:30 PM social worker states husband is agreeable with plan for discharge, discharge papers  completed, SW will also pass on to his counterpart if they are still here in the morning as well.      Final Clinical Impressions(s) / ED Diagnoses   Final diagnoses:  Fall, initial encounter  Peripheral polyneuropathy  Right hip pain  Lumbar pain    New Prescriptions New Prescriptions   No medications on file     Jerelyn Scott, MD 02/20/17 1615

## 2017-02-19 NOTE — Progress Notes (Signed)
CSW counseled pt and family again, they now say plan is to return home.  Family asked that RN educate them on proper amount of pain meds they can take once D/C'd.  CSW will update RN family now says they are ready for D/C.  CSW signing off.   Dorothe Pea. Shawn Dannenberg, Theresia Majors, LCAS Clinical Social Worker Ph: [phone]

## 2017-02-19 NOTE — ED Notes (Signed)
Bed: WA07 Expected date:  Expected time:  Means of arrival:  Comments: EMS fall,

## 2017-02-19 NOTE — Progress Notes (Signed)
At pt's request CSW sent referrals out to [facility], [facility], [facility] and [facility].  Pt aware she can follow up with these facilities after D/C and has been provided with their contact information. CSW updated EDP. Please reconsult if future social work needs arise. CSW signing off.  Theresa Flowers. Theresa Flowers, Theresa Flowers, LCAS Clinical Social Worker Ph: [phone]

## 2017-02-19 NOTE — NC FL2 (Signed)
  [place] MEDICAID FL2 LEVEL OF CARE SCREENING TOOL     IDENTIFICATION  Patient Name: Theresa Flowers Birthdate: 01/02/1946 Sex: female Admission Date (Current Location): 02/19/2017  [facility] [place] Number:  Producer, television/film/video and Address:  [facility],  [address] [place] [address]      Provider Number: [id number]  Attending Physician Name and Address:  Jerelyn Scott, MD  Relative Name and Phone Number:  Epiphany Seltzer Spouse - [phone]    Current Level of Care: Hospital Recommended Level of Care: Skilled Nursing Facility Prior Approval Number:    Date Approved/Denied:   PASRR Number: [id number]  Discharge Plan: SNF    Current Diagnoses: There are no active problems to display for this patient.   Orientation RESPIRATION BLADDER Height & Weight     Self, Time, Situation, Place  Normal Continent Weight: 187 lb (84.8 kg) Height:  5\' 9"  (175.3 cm)  BEHAVIORAL SYMPTOMS/MOOD NEUROLOGICAL BOWEL NUTRITION STATUS      Continent Diet  AMBULATORY STATUS COMMUNICATION OF NEEDS Skin   Limited Assist Verbally Normal                       Personal Care Assistance Level of Assistance  Bathing, Dressing Bathing Assistance: Limited assistance   Dressing Assistance: Limited assistance     Functional Limitations Info             SPECIAL CARE FACTORS FREQUENCY  PT (By licensed PT), OT (By licensed OT)     PT Frequency: 5 OT Frequency: 5            Contractures      Additional Factors Info  Code Status, Allergies Code Status Info: Not on file Allergies Info: Not on file           Current Medications (02/19/2017):  This is the current hospital active medication list No current facility-administered medications for this encounter.    Current Outpatient Prescriptions  Medication Sig Dispense Refill  . carbamazepine (TEGRETOL) 200 MG tablet Take 200 mg by mouth 3 (three) times daily.    . Cholecalciferol (VITAMIN D3 PO)  Take 1 tablet by mouth daily.    02/21/2017 CRANBERRY PO Take 1 tablet by mouth daily.    . DULoxetine (CYMBALTA) 30 MG capsule Take 60 mg by mouth 2 (two) times daily.    Marland Kitchen gabapentin (NEURONTIN) 300 MG capsule Take 900-1,200 mg by mouth 3 (three) times daily. Take 900mg  by mouth twice daily, and take 1200mg  by mouth at bedtime    . HYDROmorphone (DILAUDID) 2 MG tablet Take 4-6 mg by mouth every 4 (four) hours as needed for severe pain.    Marland Kitchen lisinopril (PRINIVIL,ZESTRIL) 20 MG tablet Take 20 mg by mouth daily.    . Multiple Vitamin (MULTIVITAMIN WITH MINERALS) TABS tablet Take 1 tablet by mouth daily.    . Omega-3 Fatty Acids (FISH OIL PO) Take 1 capsule by mouth 2 (two) times daily.    tiZANidine (ZANAFLEX) 4 MG tablet Take 4-6 mg by mouth 2 (two) times daily as needed for muscle spasms.    . TURMERIC PO Take 1 tablet by mouth daily.       Discharge Medications: Please see discharge summary for a list of discharge medications.  Relevant Imaging Results:  Relevant Lab Results:   Additional Information  Marland Kitchen Gracy Ehly, LCSWA

## 2017-02-19 NOTE — ED Notes (Addendum)
Patient and family member does not want to try to get up. Patient family member reports she is not able to move around at home. Informed Dr. Karma Ganja, new order obtained.

## 2017-02-20 ENCOUNTER — Encounter (HOSPITAL_COMMUNITY): Payer: Self-pay | Admitting: Internal Medicine

## 2017-02-20 ENCOUNTER — Observation Stay (HOSPITAL_COMMUNITY): Payer: PPO

## 2017-02-20 DIAGNOSIS — M5441 Lumbago with sciatica, right side: Secondary | ICD-10-CM

## 2017-02-20 DIAGNOSIS — G629 Polyneuropathy, unspecified: Secondary | ICD-10-CM | POA: Diagnosis not present

## 2017-02-20 DIAGNOSIS — G8929 Other chronic pain: Secondary | ICD-10-CM | POA: Diagnosis present

## 2017-02-20 DIAGNOSIS — I10 Essential (primary) hypertension: Secondary | ICD-10-CM | POA: Diagnosis present

## 2017-02-20 DIAGNOSIS — M545 Low back pain, unspecified: Secondary | ICD-10-CM | POA: Diagnosis present

## 2017-02-20 DIAGNOSIS — S3992XA Unspecified injury of lower back, initial encounter: Secondary | ICD-10-CM | POA: Diagnosis not present

## 2017-02-20 DIAGNOSIS — M79606 Pain in leg, unspecified: Secondary | ICD-10-CM | POA: Diagnosis present

## 2017-02-20 DIAGNOSIS — M549 Dorsalgia, unspecified: Secondary | ICD-10-CM

## 2017-02-20 MED ORDER — ONDANSETRON 4 MG PO TBDP
ORAL_TABLET | ORAL | Status: AC
  Filled 2017-02-20: qty 1

## 2017-02-20 MED ORDER — GABAPENTIN 300 MG PO CAPS
900.0000 mg | ORAL_CAPSULE | Freq: Two times a day (BID) | ORAL | Status: DC
  Administered 2017-02-21 (×2): 900 mg via ORAL
  Filled 2017-02-20 (×2): qty 3

## 2017-02-20 MED ORDER — LORAZEPAM 2 MG/ML IJ SOLN
0.5000 mg | Freq: Once | INTRAMUSCULAR | Status: AC
  Administered 2017-02-20: 0.5 mg via INTRAVENOUS
  Filled 2017-02-20: qty 1

## 2017-02-20 MED ORDER — GABAPENTIN 400 MG PO CAPS
1200.0000 mg | ORAL_CAPSULE | Freq: Every day | ORAL | Status: DC
  Administered 2017-02-20: 1200 mg via ORAL
  Filled 2017-02-20: qty 3

## 2017-02-20 MED ORDER — ONDANSETRON 4 MG PO TBDP
4.0000 mg | ORAL_TABLET | Freq: Once | ORAL | Status: AC
  Administered 2017-02-20: 4 mg via ORAL

## 2017-02-20 NOTE — ED Notes (Signed)
Notified [facility] for transportation 

## 2017-02-20 NOTE — Progress Notes (Signed)
CSW spoke with PT regarding need to PT eval ASAP.

## 2017-02-20 NOTE — Progress Notes (Addendum)
CSW spoke to Medical Director who states pt is "custodial" and who confirmed with [employer] that [employer] will not authorize a SNF stay.  As a result if pt desires a SNF stay pt will have to private pay.  CSW will inform family.  Per notes, pt is being seen by the hospitalist who will decide if pt will be admitted.  CSW will confer with EDP.    Dorothe Pea. Tay Whitwell, Theresia Majors, LCAS Clinical Social Worker Ph: [phone]

## 2017-02-20 NOTE — Progress Notes (Addendum)
Clydie Braun with [employer] called CSW back and pt's doctor has the option of a peer-to-peer consultation with [employer]'s Medical Director Dr. Jacob Moores at ph: [phone] if pt's condition changes or the pt's provider would like to appeal the denial of the pt's insurance authorization.  Pt can call  Clydie Braun with [employer] at ph: [phone] Per Clydie Braun, Pt can call and Clydie Braun can be spoken to regarding the pt's right to appeal if they so desire.   Dorothe Pea. Monte Bronder, Theresia Majors, LCAS Clinical Social Worker Ph: [phone]

## 2017-02-20 NOTE — Progress Notes (Signed)
CSW received a call from Clydie Braun at [facility] ph: [phone] who called to confirm that after staffing the pt's case with the  [facility] Medical Director Gearldine Bienenstock. Brittyn Salaz, Theresia Majors, LCAS Clinical Social Worker Ph: [phone]

## 2017-02-20 NOTE — ED Notes (Signed)
Pt given an ice pack and repositioned to try to ease her back pain. RN made aware of pt increasing pain level.

## 2017-02-20 NOTE — Progress Notes (Signed)
CSW reviewed notes and observed pt and family had refused to D/C as they had agreed to do on the night of 02/20/17 with this CSW.  CSW called [facility] SNF's which had "accepted" the pt on 02/20/17 via the hub.  Tresa Endo at [facility] reports she has a bed available after 5pm on 01/20/17 (today) pending authorization form pt's ins carrier [facility] [employer].  CSW called [facility] [employer] and [facility] is reviewing pt's case to determine if insurance can authorize a SNF stay.  CSW also called the [facility] at [facility] which had "accepted" the pt via the hub and Lowella Bandy is checking her bed availability now.  CSW will speak to family who gave CSW verbal permission to send referrals out to facilities on 02/19/17 (see notes).  Dorothe Pea. Juanitta Earnhardt, Theresia Majors, LCAS Clinical Social Worker Ph: [phone]

## 2017-02-20 NOTE — Progress Notes (Signed)
CSW spoke with patient regarding discharge plans. Patient and family unhappy with discharging home at this time. Family stated "she has pain all over, how can she go home like that? They said nothing is wrong but shes in so much pain. Clearly she needs surgery". CSW stated patient could discharge home with [facility] services however family was not happy with this plan and requested second opinion. CSW updated EDP and will return.   Stacy Gardner, LCSWA Clinical Social Worker [phone]

## 2017-02-20 NOTE — Progress Notes (Signed)
CSW gave pt Karen's number with [facility] and informed pt she can call Clydie Braun at ph: [phone] should she have any questions about her insurance authorization denial or how to appeal.  Dorothe Pea. Kory Panjwani, Theresia Majors, LCAS Clinical Social Worker Ph: [phone]

## 2017-02-20 NOTE — Evaluation (Signed)
Physical Therapy Evaluation Patient Details Name: Theresa Flowers MRN: [id number] DOB: 1946/01/08 Today's Date: 02/20/2017   History of Present Illness  71 y.o. admitted with fall, R hip, buttock and leg pain. PMH of peripheral neuropathy (numb from B knees down and B wrists/hands). Hx provided by pt/family.   Clinical Impression  Pt admitted with above diagnosis. Pt currently with functional limitations due to the deficits listed below (see PT Problem List). Pt with severely limited mobility on PT eval. Pain limited pt's tolerance of even minimal movement. She was unable to roll with assistance of 2 people 2* pain. SNF recommended.  Pt will benefit from skilled PT to increase their independence and safety with mobility to allow discharge to the venue listed below.       Follow Up Recommendations SNF;Supervision/Assistance - 24 hour    Equipment Recommendations  Hospital bed;Wheelchair (measurements PT)    Recommendations for Other Services       Precautions / Restrictions Precautions Precautions: Fall Precaution Comments: greater than 20 falls in past 3 months per family Required Braces or Orthoses: Other Brace/Splint Other Brace/Splint: B upright braces for foot drop (has been wearing these for many years) Restrictions Weight Bearing Restrictions: No      Mobility  Bed Mobility Overal bed mobility: Needs Assistance Bed Mobility: Rolling           General bed mobility comments: attempted log roll to L with +2 total assistance (pt 10%), pt reported excruciating pain in RLE with very minimal movement, pt could not complete roll, unable to achieve sidelying position  Transfers                 General transfer comment: unable; requires mechanical lift in present condition  Ambulation/Gait             General Gait Details: unable  Stairs            Wheelchair Mobility    Modified Rankin (Stroke Patients Only)       Balance Overall balance  assessment: History of Falls (unable to assess)                                           Pertinent Vitals/Pain Pain Assessment: 0-10 Pain Score: 10-Worst pain ever Pain Location: 10/10 R lateral thigh with minimal movement; 5/10 at rest Pain Descriptors / Indicators: Sharp Pain Intervention(s): Limited activity within patient's tolerance;Monitored during session;Premedicated before session    Home Living Family/patient expects to be discharged to:: Private residence Living Arrangements: Spouse/significant other;Children Available Help at Discharge: Family;Available 24 hours/day         Home Layout: Multi-level Home Equipment: Walker - 2 wheels;Cane - single point      Prior Function Level of Independence: Independent with assistive device(s)         Comments: walked with RW or cane, but has had many falls (20 in past 3 months per son)     Hand Dominance        Extremity/Trunk Assessment        Lower Extremity Assessment Lower Extremity Assessment: RLE deficits/detail;LLE deficits/detail RLE Deficits / Details: numb to light touch below B knees, foot drop B at baseline; R hip flexion AAROM 45* limited by pain, R hip flexion -2/5 LLE Deficits / Details: L hip flexion AAROM 20* limited by pain, hip flexion -2/5    Cervical / Trunk  Assessment Cervical / Trunk Assessment:  (unable to assess, pt could not achieve sitting position)  Communication   Communication: No difficulties  Cognition Arousal/Alertness: Awake/alert Behavior During Therapy: WFL for tasks assessed/performed Overall Cognitive Status: Within Functional Limits for tasks assessed                                        General Comments      Exercises     Assessment/Plan    PT Assessment Patient needs continued PT services  PT Problem List Decreased strength;Decreased range of motion;Decreased activity tolerance;Pain;Impaired sensation;Decreased mobility        PT Treatment Interventions Functional mobility training;Therapeutic exercise;Therapeutic activities;Patient/family education    PT Goals (Current goals can be found in the Care Plan section)  Acute Rehab PT Goals Patient Stated Goal: decrease pain, to be more mobile PT Goal Formulation: With patient/family Time For Goal Achievement: 03/06/17 Potential to Achieve Goals: Fair    Frequency Min 3X/week   Barriers to discharge   family cannot care for pt at home in present condition    Co-evaluation               AM-PAC PT "6 Clicks" Daily Activity  Outcome Measure Difficulty turning over in bed (including adjusting bedclothes, sheets and blankets)?: Total Difficulty moving from lying on back to sitting on the side of the bed? : Total Difficulty sitting down on and standing up from a chair with arms (e.g., wheelchair, bedside commode, etc,.)?: Total Help needed moving to and from a bed to chair (including a wheelchair)?: Total Help needed walking in hospital room?: Total Help needed climbing 3-5 steps with a railing? : Total 6 Click Score: 6    End of Session   Activity Tolerance: Patient limited by pain Patient left: in bed;with call bell/phone within reach;with family/visitor present Nurse Communication: Mobility status;Need for lift equipment;Patient requests pain meds;Other (comment) (linen saturated in urine) PT Visit Diagnosis: Pain;Muscle weakness (generalized) (M62.81);Repeated falls (R29.6);Difficulty in walking, not elsewhere classified (R26.2) Pain - Right/Left: Right Pain - part of body: Hip;Knee    Time: 1422-1440 PT Time Calculation (min) (ACUTE ONLY): 18 min   Charges:   PT Evaluation $PT Eval Moderate Complexity: 1 Procedure     PT G Codes:   PT G-Codes **NOT FOR INPATIENT CLASS** Functional Assessment Tool Used: AM-PAC 6 Clicks Basic Mobility Functional Limitation: Mobility: Walking and moving around Mobility: Walking and Moving Around Current  Status (U2725): 100 percent impaired, limited or restricted Mobility: Walking and Moving Around Goal Status (D6644): 100 percent impaired, limited or restricted      Tamala Ser 02/20/2017, 3:05 PM [phone]

## 2017-02-20 NOTE — H&P (Signed)
[facility] History and Physical  Theresa Flowers VVZ:[id number] DOB: 1946/02/10 DOA: 02/19/2017   PCP: Andreas Blower., MD  Specialists: Followed by neurology (Dr. Darlen Round) in [facility] She has seen Dr. Newell Coral previously.  Chief Complaint: Back pain  HPI: Theresa Flowers is a 71 y.o. female with a past medical history of chronic back pain, neuropathy, hypertension, whose  symptoms really started back in October when she fell. Pain has been progressively getting worse. She is followed by neurology at [facility]. She underwent epidural injection on May 4, and did feel better, however, had another fall on May 5. The pain got worse. The pain is located in the lower back. 10 out of 10 in intensity at times sharp versus aching pain radiating down her right leg. Aggravated by movement and walking. She takes gabapentin, carbamazepine and hydromorphone at home with only partial relief. She has noticed some urinary incontinence at times. She has noted worsening weakness and and more difficulty ambulating. She had another fall on the day she came in to the emergency department. She has not been able to get out of the bed.  In the emergency department patient underwent workup which did not reveal any acute findings. Plan was for the patient to go home and then it was decided that she would probably need to go to a skilled nursing facility based on physical therapy evaluation. She has required high doses of pain medication with only partial relief and inability to get up and walk which she was able to do previously. And so discussions were held with neurosurgery who will consult on the patient. They recommended doing a MRI. Patient needs better pain control. She will be brought in as observation.  Home Medications: Prior to Admission medications   Medication Sig Start Date End Date Taking? Authorizing Provider  carbamazepine (TEGRETOL) 200 MG tablet Take 200 mg by mouth 3 (three) times daily.   Yes  [provider]  Cholecalciferol (VITAMIN D3 PO) Take 1 tablet by mouth daily.   Yes [provider]  CRANBERRY PO Take 1 tablet by mouth daily.   Yes [provider]  DULoxetine (CYMBALTA) 30 MG capsule Take 60 mg by mouth 2 (two) times daily.   Yes [provider]  gabapentin (NEURONTIN) 300 MG capsule Take 900-1,200 mg by mouth 3 (three) times daily. Take 900mg  by mouth twice daily, and take 1200mg  by mouth at bedtime 11/25/16  Yes [provider]  HYDROmorphone (DILAUDID) 2 MG tablet Take 4-6 mg by mouth every 4 (four) hours as needed for severe pain.   Yes [provider]  lisinopril (PRINIVIL,ZESTRIL) 20 MG tablet Take 20 mg by mouth daily.   Yes [provider]  Multiple Vitamin (MULTIVITAMIN WITH MINERALS) TABS tablet Take 1 tablet by mouth daily.   Yes [provider]  Omega-3 Fatty Acids (FISH OIL PO) Take 1 capsule by mouth 2 (two) times daily.   Yes [provider]  tiZANidine (ZANAFLEX) 4 MG tablet Take 4-6 mg by mouth 2 (two) times daily as needed for muscle spasms.   Yes [provider]  TURMERIC PO Take 1 tablet by mouth daily.   Yes [provider]    Allergies: Not on File  Past Medical History: Past Medical History:  Diagnosis Date  . Hypertension     Past Surgical History:  Procedure Laterality Date  . ABDOMINAL HYSTERECTOMY    . ADENOIDECTOMY    . APPENDECTOMY    . TONSILLECTOMY  Social History: She lives with her husband. No smoking, alcohol use or illicit drug use. Usually able to walk without any assistance.   Family History:  Family History  Problem Relation Age of Onset  . Breast cancer Mother      Review of Systems - History obtained from the patient General ROS: positive for  - fatigue Psychological ROS: negative Ophthalmic ROS: negative ENT ROS: negative Allergy and Immunology ROS: negative Hematological and Lymphatic ROS: negative Endocrine  ROS: negative Respiratory ROS: no cough, shortness of breath, or wheezing Cardiovascular ROS: no chest pain or dyspnea on exertion Gastrointestinal ROS: no abdominal pain, change in bowel habits, or black or bloody stools Genito-Urinary ROS: no dysuria, trouble voiding, or hematuria Musculoskeletal ROS: as in hpi Neurological ROS: no TIA or stroke symptoms Dermatological ROS: negative  Physical Examination  Vitals:   02/19/17 2154 02/19/17 2348 02/20/17 1210 02/20/17 1449  BP: (!) 151/76 (!) 149/78 (!) 170/92 (!) 158/81  Pulse: 82 84 81 95  Resp: 20 20 18 18   Temp: 98.2 F (36.8 C)  98.5 F (36.9 C)   TempSrc: Oral  Oral   SpO2: 94% 93% 97% 98%  Weight:      Height:        BP (!) 158/81 (BP Location: Left Arm)   Pulse 95   Temp 98.5 F (36.9 C) (Oral)   Resp 18   Ht 5\' 9"  (1.753 m)   Wt 84.8 kg (187 lb)   SpO2 98%   BMI 27.62 kg/m   General appearance: alert, cooperative, appears stated age and no distress Head: Normocephalic, without obvious abnormality, atraumatic Eyes: conjunctivae/corneas clear. PERRL, EOM's intact.  Throat: lips, mucosa, and tongue normal; teeth and gums normal Neck: no adenopathy, no carotid bruit, no JVD, supple, symmetrical, trachea midline and thyroid not enlarged, symmetric, no tenderness/mass/nodules Resp: clear to auscultation bilaterally Cardio: regular rate and rhythm, S1, S2 normal, no murmur, click, rub or gallop GI: soft, non-tender; bowel sounds normal; no masses,  no organomegaly Extremities: Both legs and braces. No edema noted Pulses: 2+ and symmetric Skin: Skin color, texture, turgor normal. No rashes or lesions Neurologic: Cranial nerves II-12 intact. Motor strength equal bilateral upper extremities. Both the lower legs and braces. She was able to left her left leg off the bed to some extent. Strength is perhaps 3-4 out of 5. Right leg was 2-3 out of 5.    Labs on Admission: I have personally reviewed following labs and imaging  studies  CBC:  Recent Labs Lab 02/19/17 1801  WBC 7.7  HGB 11.1*  HCT 31.6*  MCV 85.4  PLT 183   Basic Metabolic Panel:  Recent Labs Lab 02/19/17 1801  NA 138  K 3.7  CL 104  CO2 25  GLUCOSE 104*  BUN 23*  CREATININE 0.70  CALCIUM 8.8*    Radiological Exams on Admission: Dg Lumbar Spine Complete  Result Date: 02/19/2017 CLINICAL DATA:  71 year old female with fall from bed presenting with low back pain. EXAM: LUMBAR SPINE - COMPLETE 4+ VIEW COMPARISON:  Lumbar spine CT dated 01/17/2011 FINDINGS: There is no acute fracture or subluxation of the lumbar spine. There multilevel degenerative changes with endplate irregularity, disc space narrowing, and multilevel disc desiccation with vacuum phenomena. There is grade 1 L4-L5 anterolisthesis. Multilevel facet hypertrophy noted. The visualized transverse and spinous processes appear intact. Moderate stool noted in the visualized colon. The soft tissues are otherwise unremarkable. IMPRESSION: 1. No acute/traumatic lumbar spine pathology. 2. Multilevel degenerative changes.  Electronically Signed   By: Elgie Collard M.D.   On: 02/19/2017 19:12   Ct Lumbar Spine Wo Contrast  Result Date: 02/19/2017 CLINICAL DATA:  Larey Seat this morning, no loss of consciousness. Severe low back pain radiating to legs. EXAM: CT LUMBAR SPINE WITHOUT CONTRAST TECHNIQUE: Multidetector CT imaging of the lumbar spine was performed without intravenous contrast administration. Multiplanar CT image reconstructions were also generated. COMPARISON:  On lumbar spine radiographs February 19, 2017 at 1835 hours and CT lumbar spine Jan 17, 2011 FINDINGS: SEGMENTATION: For the purposes of this report the last well-formed intervertebral disc space is reported as L5-S1. ALIGNMENT: Maintained lumbar lordosis. Minimal grade 1 L4-5 anterolisthesis. No spondylolysis. Mild broad levoscoliosis on this nonweightbearing examination. VERTEBRAE: Vertebral bodies and posterior elements are  intact. Severe T12-L1 thru L3-4 disc height loss is unchanged. Moderate to severe L4-5 and L5-S1 disc height loss with vacuum disc all lumbar levels. No destructive bony lesions. PARASPINAL AND OTHER SOFT TISSUES: Layering density in the gallbladder can be seen with sludge and, vicarious excretion of contrast. DISC LEVELS: T11-12: Small broad-based disc osteophyte complex, mild facet arthropathy and ligamentum flavum redundancy without canal stenosis. Severe bilateral neural foraminal narrowing. T12-L1: Moderate broad-based disc osteophyte complex. subcentimeter probable disc fragment LEFT lateral recess at T12 may affect the traversing LEFT L1 nerve. Moderate broad-based disc osteophyte complex. Moderate facet arthropathy and ligamentum flavum redundancy. Moderate canal stenosis. Moderate severe neural foraminal narrowing. L1-2: Small broad-based disc osteophyte complex, mild facet arthropathy and ligamentum flavum redundancy. No canal stenosis. Severe deer bilateral neural foraminal narrowing. L2-3: Small broad-based disc osteophyte complex asymmetric to the RIGHT. Moderate facet arthropathy on the RIGHT, along LEFT. No canal stenosis. Moderate to severe RIGHT, moderate LEFT neural foraminal narrowing. L3-4: Small broad-based disc osteophyte complex. Moderate to severe facet arthropathy and ligamentum flavum redundancy. Mild canal stenosis. Severe RIGHT, moderate LEFT neural foraminal narrowing. L4-5: Anterolisthesis. Moderate broad-based disc osteophyte complex. Severe facet arthropathy and ligamentum flavum redundancy. Moderate canal stenosis. Moderate to severe bilateral neural foraminal narrowing. L5-S1: Small broad-based disc osteophyte complex. Moderate to severe facet arthropathy and ligamentum flavum redundancy without canal stenosis. Severe bilateral neural foraminal narrowing. IMPRESSION: Progressed degenerative change of the lumbar spine, grade 1 L4-5 anterolisthesis without spondylolysis. No acute  fracture. Probable disc fragment ventral epidural space at T12, new from prior CT though, may not be acute. Fragment may affect the traversing LEFT L1 nerve. Moderate canal stenosis T12-L1 and L4-5. Multilevel severe neural foraminal narrowing. Electronically Signed   By: Awilda Metro M.D.   On: 02/19/2017 21:56   Ct Hip Right Wo Contrast  Result Date: 02/19/2017 CLINICAL DATA:  71 year old female with fall and right hip pain. EXAM: CT OF THE RIGHT HIP WITHOUT CONTRAST TECHNIQUE: Multidetector CT imaging of the right hip was performed according to the standard protocol. Multiplanar CT image reconstructions were also generated. COMPARISON:  Radiograph dated 02/19/2017 FINDINGS: Bones/Joint/Cartilage No acute fracture. The bones are osteopenic. No dislocation. No significant degenerative changes. Ligaments Suboptimally assessed by CT. Muscles and Tendons No acute findings.  No intramuscular hematoma. Soft tissues Mild stranding of the right gluteal subcutaneous fat. IMPRESSION: No acute fracture or dislocation. Electronically Signed   By: Elgie Collard M.D.   On: 02/19/2017 22:09   Dg Hip Unilat W Or Wo Pelvis 2-3 Views Right  Result Date: 02/19/2017 CLINICAL DATA:  71 year old who fell out of bed this morning and complains of low back pain and right hip pain. Initial encounter. EXAM: DG HIP (WITH OR WITHOUT  PELVIS) 2-3V RIGHT COMPARISON:  None. FINDINGS: No evidence of acute fracture or dislocation. Well-preserved joint space. Well-preserved bone mineral density for age. Small benign bone island involving the right femoral head. Included AP pelvis demonstrates a normal-appearing contralateral left hip joint. Sacroiliac joints and symphysis pubis intact. Degenerative changes involving the visualized lower lumbar spine. IMPRESSION: No acute osseous abnormality. Electronically Signed   By: Hulan Saas M.D.   On: 02/19/2017 19:10     Problem List  Principal Problem:   Low back pain radiating  down leg Active Problems:   Neuropathy   Chronic back pain   Essential hypertension   Assessment: This is a 71 year old Caucasian female with a past medical history as stated earlier, who presents after sustaining a fall. She does have chronic back pain and it appears that her symptoms and functional level has been getting worse over the past few months. Unclear how much of her weakness is new versus chronic. She is followed by neurology at [facility]  Plan: #1 acute on chronic low back pain with radiculopathy: She underwent CT scan which does show some disc disease. Other findings as discussed in the CT scan report. No hip fracture noted. Despite pain medications patient has not been able to do much in the emergency department. She's not been able to ambulate. Neurosurgery was consulted by emergency department. Dr. Venetia Maxon to come and evaluate patient. He recommends MRI lumbar spine. Since patient has been unable to do get out of the bed at all we will observe her in the hospital for pain management. PT and OT evaluation after MRI has been done and she has been cleared by neurosurgery. Continue with hydromorphone that she takes at home. Continue with her other home medications. Laxatives and stool softeners to avoid constipation from narcotics.  #2 history of peripheral neuropathy involving both her legs. Followed by neurology at [facility]. Continue home medications.  #3 Essential hypertension: Continue home medications.   DVT Prophylaxis: Lovenox Code Status: Full code Family Communication: Discussed with the patient and her husband and her son  Consults called: Neurosurgery: Dr. Venetia Maxon   At this time patient only meets observation criteria.  Severity of Illness: The appropriate patient status for this patient is OBSERVATION. Observation status is judged to be reasonable and necessary in order to provide the required intensity of service to ensure the patient's safety. The patient's  presenting symptoms, physical exam findings, and initial radiographic and laboratory data in the context of their medical condition is felt to place them at decreased risk for further clinical deterioration. Furthermore, it is anticipated that the patient will be medically stable for discharge from the hospital within 2 midnights of admission. The following factors support the patient status of observation.   " The patient's presenting symptoms include low back pain. " The physical exam findings include weakness of the lower extremities. " The initial radiographic and laboratory data revealed degenerative disc disease.  Further management decisions will depend on results of further testing and patient's response to treatment.   [facility]  [facility] Pager [phone]  If 7PM-7AM, please contact night-coverage [email or web address] Password [email or web address]  02/20/2017, 5:10 PM

## 2017-02-20 NOTE — Progress Notes (Signed)
CSW spoke at length with Orthoptist regarding patients case. Patient had PT evaluation which dis recommend SNF/ supervision- this will need to be discussed with EDP, director, Chiropodist. CSW will continue to follow up for additional discharge plans.   Stacy Gardner, LCSWA Clinical Social Worker [phone]

## 2017-02-20 NOTE — ED Provider Notes (Signed)
Throughout the morning I discussed the patient's case multiple times with social workers, physical therapy.  And on several occasions interviewed the patient and her family members. She continues to have substantial pain, substantial enough to interfere with ability to roll, deformity assessment realistically of her physical therapy needs. With inability to ambulate, function, following a fall I also discussed her case with our neurosurgical colleagues, Dr. Venetia Maxon. Dr. Venetia Maxon will follow as a consultant and the patient was admitted to the hospitalist service given her inability to ambulate.   Gerhard Munch, MD 02/20/17 [phone]

## 2017-02-20 NOTE — ED Notes (Signed)
Pt care assumed, report obtained.  Family at bedside.  Linen changed d/t them being soiled with urine.  Pt insisted on her son to stay in the room to help her turn.  Pt tolerated logrolling well. After the son stepped out of her room to speak to her husband who is sitting outside of her room.

## 2017-02-20 NOTE — ED Notes (Signed)
Peri care performed with 3 assist. Pt required max assistance to turn and perform peri care. Unable to turn self. Incontinent of urine.

## 2017-02-20 NOTE — Progress Notes (Addendum)
CSW met with family and pt and informed them their insurance carrier [employer] will not authorize a SNF stay at this time due to pt being seen as custodial.  Per notes pt cannot "logroll" at this time and as such can not be viewed as able to participate in PT, per [employer] and [employer].   As a result pt will have to private pay if D/C'd to a SNF, rather than D/C home at this time.    [facility] still views pt as accepted as long as [employer] authorizes the pt.  5:37 PM CSW spoke with EDP who confirmed pt is being admitted at this time. CSW confirmed family is aware.    Alphonse Guild. Quiana Cobaugh, Latanya Presser, LCAS Clinical Social Worker Ph: [phone]

## 2017-02-20 NOTE — ED Notes (Signed)
Patient reports pain is severe and unable to allow staff to lift head of bed due to pain. Took medications without complications. Fingers on both hands are contracted. Needs assistance to hold cup. Left leg brace in place also.

## 2017-02-21 DIAGNOSIS — M549 Dorsalgia, unspecified: Secondary | ICD-10-CM | POA: Diagnosis not present

## 2017-02-21 DIAGNOSIS — M545 Low back pain: Secondary | ICD-10-CM

## 2017-02-21 DIAGNOSIS — M62838 Other muscle spasm: Secondary | ICD-10-CM | POA: Diagnosis not present

## 2017-02-21 DIAGNOSIS — R52 Pain, unspecified: Secondary | ICD-10-CM | POA: Diagnosis not present

## 2017-02-21 DIAGNOSIS — I10 Essential (primary) hypertension: Secondary | ICD-10-CM | POA: Diagnosis not present

## 2017-02-21 DIAGNOSIS — K210 Gastro-esophageal reflux disease with esophagitis: Secondary | ICD-10-CM | POA: Diagnosis not present

## 2017-02-21 DIAGNOSIS — K5900 Constipation, unspecified: Secondary | ICD-10-CM | POA: Diagnosis not present

## 2017-02-21 DIAGNOSIS — G629 Polyneuropathy, unspecified: Secondary | ICD-10-CM | POA: Diagnosis not present

## 2017-02-21 DIAGNOSIS — R2689 Other abnormalities of gait and mobility: Secondary | ICD-10-CM | POA: Diagnosis not present

## 2017-02-21 DIAGNOSIS — Z9181 History of falling: Secondary | ICD-10-CM | POA: Diagnosis not present

## 2017-02-21 DIAGNOSIS — M6281 Muscle weakness (generalized): Secondary | ICD-10-CM | POA: Diagnosis not present

## 2017-02-21 DIAGNOSIS — E569 Vitamin deficiency, unspecified: Secondary | ICD-10-CM | POA: Diagnosis not present

## 2017-02-21 LAB — BASIC METABOLIC PANEL
ANION GAP: 9 (ref 5–15)
BUN: 27 mg/dL — ABNORMAL HIGH (ref 6–20)
CHLORIDE: 100 mmol/L — AB (ref 101–111)
CO2: 28 mmol/L (ref 22–32)
Calcium: 9.3 mg/dL (ref 8.9–10.3)
Creatinine, Ser: 0.86 mg/dL (ref 0.44–1.00)
Glucose, Bld: 139 mg/dL — ABNORMAL HIGH (ref 65–99)
POTASSIUM: 3.5 mmol/L (ref 3.5–5.1)
Sodium: 137 mmol/L (ref 135–145)

## 2017-02-21 LAB — CBC
HEMATOCRIT: 33.6 % — AB (ref 36.0–46.0)
HEMOGLOBIN: 11.5 g/dL — AB (ref 12.0–15.0)
MCH: 29.2 pg (ref 26.0–34.0)
MCHC: 34.2 g/dL (ref 30.0–36.0)
MCV: 85.3 fL (ref 78.0–100.0)
Platelets: 238 10*3/uL (ref 150–400)
RBC: 3.94 MIL/uL (ref 3.87–5.11)
RDW: 12.7 % (ref 11.5–15.5)
WBC: 9.5 10*3/uL (ref 4.0–10.5)

## 2017-02-21 MED ORDER — ENOXAPARIN SODIUM 40 MG/0.4ML ~~LOC~~ SOLN
40.0000 mg | SUBCUTANEOUS | Status: DC
  Administered 2017-02-21: 40 mg via SUBCUTANEOUS
  Filled 2017-02-21: qty 0.4

## 2017-02-21 MED ORDER — POLYETHYLENE GLYCOL 3350 17 G PO PACK: 17.0000 g | PACK | Freq: Every day | ORAL | Status: DC | PRN

## 2017-02-21 MED ORDER — HYDROMORPHONE HCL 4 MG PO TABS: 4.0000 mg | ORAL_TABLET | ORAL | 0 refills | Status: DC | PRN

## 2017-02-21 MED ORDER — SODIUM CHLORIDE 0.9% FLUSH
3.0000 mL | Freq: Two times a day (BID) | INTRAVENOUS | Status: DC
  Administered 2017-02-21: 3 mL via INTRAVENOUS

## 2017-02-21 MED ORDER — ACETAMINOPHEN 650 MG RE SUPP: 650.0000 mg | Freq: Four times a day (QID) | RECTAL | Status: DC | PRN

## 2017-02-21 MED ORDER — POLYETHYLENE GLYCOL 3350 17 G PO PACK: 17.0000 g | PACK | Freq: Every day | ORAL | 0 refills | Status: DC | PRN

## 2017-02-21 MED ORDER — SENNA 8.6 MG PO TABS
1.0000 | ORAL_TABLET | Freq: Two times a day (BID) | ORAL | Status: DC
  Administered 2017-02-21: 8.6 mg via ORAL
  Filled 2017-02-21: qty 1

## 2017-02-21 MED ORDER — ONDANSETRON HCL 4 MG/2ML IJ SOLN: 4.0000 mg | Freq: Four times a day (QID) | INTRAMUSCULAR | Status: DC | PRN

## 2017-02-21 MED ORDER — ACETAMINOPHEN 325 MG PO TABS: 650.0000 mg | ORAL_TABLET | Freq: Four times a day (QID) | ORAL | Status: DC | PRN

## 2017-02-21 MED ORDER — MORPHINE SULFATE (PF) 2 MG/ML IV SOLN: 2.0000 mg | INTRAVENOUS | Status: DC | PRN

## 2017-02-21 MED ORDER — SODIUM CHLORIDE 0.9 % IV SOLN: 250.0000 mL | INTRAVENOUS | Status: DC | PRN

## 2017-02-21 MED ORDER — SODIUM CHLORIDE 0.9% FLUSH: 3.0000 mL | INTRAVENOUS | Status: DC | PRN

## 2017-02-21 MED ORDER — ONDANSETRON HCL 4 MG PO TABS: 4.0000 mg | ORAL_TABLET | Freq: Four times a day (QID) | ORAL | Status: DC | PRN

## 2017-02-21 NOTE — Progress Notes (Addendum)
Pt discharged. Husband and son with pt at time of discharge. Pt's belongings and paper rx sent with pt. Report called to [facility] facility, spoke with [facility]. No distress noted.

## 2017-02-21 NOTE — Progress Notes (Signed)
No code status on file for pt. MD paged.

## 2017-02-21 NOTE — Clinical Social Work Placement (Signed)
Pt discharging to [facility] SNF. See below for placement details Was informed per TC with Clydie Braun at [facility] that pt has been approved for SNF authorization by peer-to-peer review. Auth#[id number] for 7 days. CSW provided information to [facility]. Pt assigned room 610-B Report Number for RN [phone]  CLINICAL SOCIAL WORK PLACEMENT  NOTE  Date:  02/21/2017  Patient Details  Name: Theresa Flowers MRN: [id number] Date of Birth: 08-17-1946  Clinical Social Work is seeking post-discharge placement for this patient at the Skilled  Nursing Facility level of care (*CSW will initial, date and re-position this form in  chart as items are completed):  Yes   Patient/family provided with [facility] Clinical Social Work Department's list of facilities offering this level of care within the geographic area requested by the patient (or if unable, by the patient's family).  Yes   Patient/family informed of their freedom to choose among providers that offer the needed level of care, that participate in Medicare, Medicaid or managed care program needed by the patient, have an available bed and are willing to accept the patient.  Yes   Patient/family informed of [facility]'s ownership interest in [facility] and [facility], as well as of the fact that they are under no obligation to receive care at these facilities.  PASRR submitted to EDS on       PASRR number received on       Existing PASRR number confirmed on 02/19/17     FL2 transmitted to all facilities in geographic area requested by pt/family on 02/21/17     FL2 transmitted to all facilities within larger geographic area on       Patient informed that his/her managed care company has contracts with or will negotiate with certain facilities, including the following:        Yes   Patient/family informed of bed offers received.  Patient chooses bed at [facility]     Physician recommends and patient chooses bed at [facility]     Patient to be transferred to [facility] on 02/21/17.  Patient to be transferred to facility by [facility]     Patient family notified on 02/21/17 of transfer.  Name of family member notified:  spouse John     PHYSICIAN       Additional Comment:    _______________________________________________ Nelwyn Salisbury, LCSW 02/21/2017, 4:14 PM

## 2017-02-21 NOTE — Progress Notes (Signed)
Discharge plan:  Pt to dc to [facility] SNF. Sandford Craze RN,BSN,NCM [phone]

## 2017-02-21 NOTE — Care Management Obs Status (Signed)
MEDICARE OBSERVATION STATUS NOTIFICATION   Patient Details  Name: Theresa Flowers MRN: [id number] Date of Birth: 1946-09-03   Medicare Observation Status Notification Given:  Yes    Bartholome Bill, RN 02/21/2017, 2:27 PM

## 2017-02-21 NOTE — Discharge Summary (Signed)
Theresa Flowers, is a 71 y.o. female  DOB 1946/02/05  MRN [id number].  Admission date:  02/19/2017  Admitting Physician  Osvaldo Shipper, MD  Discharge Date:  02/21/2017   Primary MD  Andreas Blower., MD  Recommendations for primary care physician for things to follow:   Activity as per physical therapist... Fall precautions  F/u with Dr Newell Coral as outpatient for possible Epidural shots   Admission Diagnosis  Lumbar pain [M54.5] Pain [R52] Right hip pain [M25.551] Peripheral polyneuropathy [G62.9] Fall, initial encounter [W19.XXXA]   Discharge Diagnosis  Lumbar pain [M54.5] Pain [R52] Right hip pain [M25.551] Peripheral polyneuropathy [G62.9] Fall, initial encounter [W19.XXXA]   Principal Problem:   Low back pain radiating down leg Active Problems:   Neuropathy   Chronic back pain   Essential hypertension      Past Medical History:  Diagnosis Date  . Hypertension     Past Surgical History:  Procedure Laterality Date  . ABDOMINAL HYSTERECTOMY    . ADENOIDECTOMY    . APPENDECTOMY    . TONSILLECTOMY         HPI  from the history and physical done on the day of admission:     PCP: Andreas Blower., MD  Specialists: Followed by neurology (Dr. Darlen Round) in [facility]. She has seen Dr. Newell Coral previously.  Chief Complaint: Back pain  HPI: Theresa Flowers is a 71 y.o. female with a past medical history of chronic back pain, neuropathy, hypertension, whose  symptoms really started back in October when she fell. Pain has been progressively getting worse. She is followed by neurology at [facility]. She underwent epidural injection on May 4, and did feel better, however, had another fall on May 5. The pain got worse. The pain is located in the lower back. 10 out of 10 in intensity at times sharp versus aching pain radiating down her right leg. Aggravated by movement and walking. She  takes gabapentin, carbamazepine and hydromorphone at home with only partial relief. She has noticed some urinary incontinence at times. She has noted worsening weakness and and more difficulty ambulating. She had another fall on the day she came in to the emergency department. She has not been able to get out of the bed.  In the emergency department patient underwent workup which did not reveal any acute findings. Plan was for the patient to go home and then it was decided that she would probably need to go to a skilled nursing facility based on physical therapy evaluation. She has required high doses of pain medication with only partial relief and inability to get up and walk which she was able to do previously. And so discussions were held with neurosurgery who will consult on the patient. They recommended doing a MRI. Patient needs better pain control. She will be brought in as observation.    Hospital Course:     Brief summary :- This is a 71 year old Caucasian female with a past medical history relevant for multilevel degenerative disc disease of  the lumbar spine and chronic back pain who presents after sustaining a fall.  She is followed by neurology at [facility]. Patient was able to walk with a walker until about 3 or 4 days ago. After her recent fall increased back pain and increased difficulty with mobility related activities of daily living over the last 72 hours. Lumbar MRI shows new T12/L1 acute disc extrusion, as well as old severe neuroforaminal narrowing and multilevel Lumbar disc disease  Plan: #1 Acute on chronic Low Back pain with radiculopathy: Acute worsening of chronic back pain,. No hip fracture noted. Patient was able to walk with a walker until about 3 or 4 days ago. After her recent fall increased back pain and increased difficulty with mobility related activities of daily living over the last 72 hours. Lumbar MRI shows new T12/L1 acute disc extrusion, as well as old severe  neuroforaminal narrowing and multilevel Lumbar disc disease  . PT and OT evaluation after MRI has been done and she has been cleared by neurosurgery. Continue with prn  hydromorphone tab.  C/n Cymbalta, Tegretol and gabapentin as well as Zanaflex. Patient will follow-up as outpatient with Dr. Walker Shadow from neurosurgery. [facility]  #2 history of peripheral neuropathy involving both her legs. Followed by neurology at [facility]. Continue home medications. Previously evaluated at [facility]  #3 Essential hypertension: Continue home medications   Discharge Condition: stable,   Follow UP   Contact information for follow-up providers    [facility] Follow up in 2 day(s).   Why:  You should arrange for a recheck with your primary care doctor in the next 2-3 days, call the number provided if you need a doctor Contact information: [address] [place] [address] [phone]       Yolonda Kida, MD Follow up in 3 day(s).   Specialty:  Orthopedic Surgery Contact information: [address] [place] [address] [phone]        Shirlean Kelly, MD. Schedule an appointment as soon as possible for a visit in 2 week(s).   Specialty:  Neurosurgery Why:  may need Epidural shots Contact information: [address] [place] [address] [phone]            Contact information for after-discharge care    Destination    [facility] Follow up.   Specialty:  Skilled Nursing Facility Contact information: [address] [place] [address] [phone]                   Consults obtained - Neurosurgery  Diet and Activity recommendation:  As advised  Discharge Instructions     Discharge Instructions    Call MD for:  difficulty breathing, headache or visual disturbances    Complete by:  As directed    Call MD for:   persistant dizziness or light-headedness    Complete by:  As directed    Call MD for:  persistant nausea and vomiting    Complete by:  As directed    Call MD for:  severe uncontrolled pain    Complete by:  As directed    Call MD for:  temperature >100.4    Complete by:  As directed    Diet - low sodium heart healthy    Complete by:  As directed    Discharge instructions    Complete by:  As directed    1)Activity  as per physical therapist... Fall precautions 2)Follow up with Dr. Newell Coral (Neurosurgeon) as outpatient to discuss possible epidural steroid shot or other treatment modalities for your worsening low back pain.   Increase activity slowly    Complete by:  As directed    Activity as per physical therapist... Fall precautions        Discharge Medications     Allergies as of 02/21/2017   Not on File     Medication List    TAKE these medications   carbamazepine 200 MG tablet Commonly known as:  TEGRETOL Take 200 mg by mouth 3 (three) times daily.   CRANBERRY PO Take 1 tablet by mouth daily.   DULoxetine 30 MG capsule Commonly known as:  CYMBALTA Take 60 mg by mouth 2 (two) times daily.   FISH OIL PO Take 1 capsule by mouth 2 (two) times daily.   gabapentin 300 MG capsule Commonly known as:  NEURONTIN Take 900-1,200 mg by mouth 3 (three) times daily. Take 900mg  by mouth twice daily, and take 1200mg  by mouth at bedtime   HYDROmorphone 4 MG tablet Commonly known as:  DILAUDID Take 1 tablet (4 mg total) by mouth every 4 (four) hours as needed for severe pain. What changed:  medication strength  how much to take   lisinopril 20 MG tablet Commonly known as:  PRINIVIL,ZESTRIL Take 20 mg by mouth daily.   multivitamin with minerals Tabs tablet Take 1 tablet by mouth daily.   polyethylene glycol packet Commonly known as:  MIRALAX / GLYCOLAX Take 17 g by mouth daily as needed for mild constipation.   tiZANidine 4 MG tablet Commonly known as:   ZANAFLEX Take 4-6 mg by mouth 2 (two) times daily as needed for muscle spasms.   TURMERIC PO Take 1 tablet by mouth daily.   VITAMIN D3 PO Take 1 tablet by mouth daily.       Major procedures and Radiology Reports - PLEASE review detailed and final reports for all details, in brief -   Dg Lumbar Spine Complete  Result Date: 02/19/2017 CLINICAL DATA:  71 year old female with fall from bed presenting with low back pain. EXAM: LUMBAR SPINE - COMPLETE 4+ VIEW COMPARISON:  Lumbar spine CT dated 01/17/2011 FINDINGS: There is no acute fracture or subluxation of the lumbar spine. There multilevel degenerative changes with endplate irregularity, disc space narrowing, and multilevel disc desiccation with vacuum phenomena. There is grade 1 L4-L5 anterolisthesis. Multilevel facet hypertrophy noted. The visualized transverse and spinous processes appear intact. Moderate stool noted in the visualized colon. The soft tissues are otherwise unremarkable. IMPRESSION: 1. No acute/traumatic lumbar spine pathology. 2. Multilevel degenerative changes. Electronically Signed   By: 62 M.D.   On: 02/19/2017 19:12   Ct Lumbar Spine Wo Contrast  Result Date: 02/19/2017 CLINICAL DATA:  02/21/2017 this morning, no loss of consciousness. Severe low back pain radiating to legs. EXAM: CT LUMBAR SPINE WITHOUT CONTRAST TECHNIQUE: Multidetector CT imaging of the lumbar spine was performed without intravenous contrast administration. Multiplanar CT image reconstructions were also generated. COMPARISON:  On lumbar spine radiographs February 19, 2017 at 1835 hours and CT lumbar spine Jan 17, 2011 FINDINGS: SEGMENTATION: For the purposes of this report the last well-formed intervertebral disc space is reported as L5-S1. ALIGNMENT: Maintained lumbar lordosis. Minimal grade 1 L4-5 anterolisthesis. No spondylolysis. Mild broad levoscoliosis on this nonweightbearing examination. VERTEBRAE: Vertebral bodies and posterior elements are  intact. Severe T12-L1 thru L3-4 disc height loss is unchanged. Moderate to severe  L4-5 and L5-S1 disc height loss with vacuum disc all lumbar levels. No destructive bony lesions. PARASPINAL AND OTHER SOFT TISSUES: Layering density in the gallbladder can be seen with sludge and, vicarious excretion of contrast. DISC LEVELS: T11-12: Small broad-based disc osteophyte complex, mild facet arthropathy and ligamentum flavum redundancy without canal stenosis. Severe bilateral neural foraminal narrowing. T12-L1: Moderate broad-based disc osteophyte complex. subcentimeter probable disc fragment LEFT lateral recess at T12 may affect the traversing LEFT L1 nerve. Moderate broad-based disc osteophyte complex. Moderate facet arthropathy and ligamentum flavum redundancy. Moderate canal stenosis. Moderate severe neural foraminal narrowing. L1-2: Small broad-based disc osteophyte complex, mild facet arthropathy and ligamentum flavum redundancy. No canal stenosis. Severe deer bilateral neural foraminal narrowing. L2-3: Small broad-based disc osteophyte complex asymmetric to the RIGHT. Moderate facet arthropathy on the RIGHT, along LEFT. No canal stenosis. Moderate to severe RIGHT, moderate LEFT neural foraminal narrowing. L3-4: Small broad-based disc osteophyte complex. Moderate to severe facet arthropathy and ligamentum flavum redundancy. Mild canal stenosis. Severe RIGHT, moderate LEFT neural foraminal narrowing. L4-5: Anterolisthesis. Moderate broad-based disc osteophyte complex. Severe facet arthropathy and ligamentum flavum redundancy. Moderate canal stenosis. Moderate to severe bilateral neural foraminal narrowing. L5-S1: Small broad-based disc osteophyte complex. Moderate to severe facet arthropathy and ligamentum flavum redundancy without canal stenosis. Severe bilateral neural foraminal narrowing. IMPRESSION: Progressed degenerative change of the lumbar spine, grade 1 L4-5 anterolisthesis without spondylolysis. No acute  fracture. Probable disc fragment ventral epidural space at T12, new from prior CT though, may not be acute. Fragment may affect the traversing LEFT L1 nerve. Moderate canal stenosis T12-L1 and L4-5. Multilevel severe neural foraminal narrowing. Electronically Signed   By: Awilda Metro M.D.   On: 02/19/2017 21:56   Mr Lumbar Spine Wo Contrast  Result Date: 02/20/2017 CLINICAL DATA:  Fall, buttock, RIGHT hip and leg pain. Symptoms began in October last year. History of neuropathy. EXAM: MRI LUMBAR SPINE WITHOUT CONTRAST TECHNIQUE: Multiplanar, multisequence MR imaging of the lumbar spine was performed. No intravenous contrast was administered. COMPARISON:  CT lumbar spine February 19, 2017 and MRI of lumbar spine August 23, 2016 FINDINGS: Moderately motion degraded examination. SEGMENTATION: For the purposes of this report, the last well-formed intervertebral disc will be described as L5-S1. ALIGNMENT: Maintenance of the lumbar lordosis. Minimal grade 1 L4-5 anterolisthesis without spondylolysis. VERTEBRAE:Vertebral bodies are intact. Severe thoracic and, L1-2 thru L3-4 disc height loss, moderate L4-5 and L5-S1. Disc height loss is similar with T2 bright disc edema all levels, vacuum disc on prior CT could not system with degenerative, benign signal. Severe acute on chronic discogenic endplate changes T12-L1, L1-2. Moderate chronic discogenic endplate changes L2-3 through L5-S1. CONUS MEDULLARIS: Motion obscures the spinal cord, conus medullaris not identified or characterized. Limited assessment of cauda equina. PARASPINAL AND SOFT TISSUES: Included prevertebral and paraspinal soft tissues are nonacute. Mild symmetric paraspinal muscle atrophy. DISC LEVELS: T12-L1 Moderate central disc extrusion with superior migration, bright STIR signal suggesting acute process. Moderate broad-based disc bulge. Moderate to severe facet arthropathy and ligamentum flavum redundancy. Moderate to severe canal stenosis. Mild  RIGHT, moderate LEFT neural foraminal narrowing. L1-2: Moderate broad-based disc bulge, moderate facet arthropathy and ligamentum flavum redundancy. Moderate to severe canal stenosis slightly worse than prior examination. Moderate to severe RIGHT, moderate LEFT neural foraminal narrowing. L2-3: Moderate broad-based disc bulge asymmetric to the RIGHT may affect the exited RIGHT L3 nerve. Moderate facet arthropathy and ligamentum flavum redundancy. Moderate to severe canal stenosis. Moderate bilateral neural foraminal narrowing. L3-4: Small broad-based disc bulge,  endplate spurring. Moderate facet arthropathy and ligamentum flavum redundancy. Moderate to severe canal stenosis is similar to worse. Moderate bilateral neural foraminal narrowing. L4-5 anterolisthesis. Small broad-based disc bulge, severe facet arthropathy and ligamentum flavum redundancy with trace facet effusions are which are likely reactive. Moderate canal stenosis. Moderate to severe RIGHT, severe LEFT neural foraminal narrowing. L5-S1: Moderate broad-based disc bulge. Moderate to severe facet arthropathy. No canal stenosis. Moderate to severe RIGHT, severe LEFT neural foraminal narrowing. IMPRESSION: Moderately motion degraded examination. No acute. Stable minimal grade 1 L4-5 anterolisthesis. New moderate T12-L1 acute appearing disc extrusion. Moderate to severe canal stenosis L1-2 thru L3-4, moderate at L4-5. Neural foraminal narrowing all lumbar levels: Severe on the LEFT at L4-5 and L5-S1. Electronically Signed   By: Awilda Metro M.D.   On: 02/20/2017 19:18   Ct Hip Right Wo Contrast  Result Date: 02/19/2017 CLINICAL DATA:  71 year old female with fall and right hip pain. EXAM: CT OF THE RIGHT HIP WITHOUT CONTRAST TECHNIQUE: Multidetector CT imaging of the right hip was performed according to the standard protocol. Multiplanar CT image reconstructions were also generated. COMPARISON:  Radiograph dated 02/19/2017 FINDINGS:  Bones/Joint/Cartilage No acute fracture. The bones are osteopenic. No dislocation. No significant degenerative changes. Ligaments Suboptimally assessed by CT. Muscles and Tendons No acute findings.  No intramuscular hematoma. Soft tissues Mild stranding of the right gluteal subcutaneous fat. IMPRESSION: No acute fracture or dislocation. Electronically Signed   By: Elgie Collard M.D.   On: 02/19/2017 22:09   Dg Hip Unilat W Or Wo Pelvis 2-3 Views Right  Result Date: 02/19/2017 CLINICAL DATA:  71 year old who fell out of bed this morning and complains of low back pain and right hip pain. Initial encounter. EXAM: DG HIP (WITH OR WITHOUT PELVIS) 2-3V RIGHT COMPARISON:  None. FINDINGS: No evidence of acute fracture or dislocation. Well-preserved joint space. Well-preserved bone mineral density for age. Small benign bone island involving the right femoral head. Included AP pelvis demonstrates a normal-appearing contralateral left hip joint. Sacroiliac joints and symphysis pubis intact. Degenerative changes involving the visualized lower lumbar spine. IMPRESSION: No acute osseous abnormality. Electronically Signed   By: Hulan Saas M.D.   On: 02/19/2017 19:10    Micro Results   No results found for this or any previous visit (from the past 240 hour(s)).     Today   Subjective    Theresa Flowers today has no Fevers or chills.  continue to have back pain and difficulty with ambulation          Patient has been seen and examined prior to discharge   Objective   Blood pressure 138/82, pulse 96, temperature 98.9 F (37.2 C), temperature source Oral, resp. rate 20, height 5\' 9"  (1.753 m), weight 84.9 kg (187 lb 2.7 oz), SpO2 96 %.   Intake/Output Summary (Last 24 hours) at 02/21/17 1435 Last data filed at 02/21/17 1030  Gross per 24 hour  Intake              600 ml  Output                0 ml  Net              600 ml    Exam Gen:- Awake   HEENT:- [place].AT,   Neck-Supple Neck,No JVD,   Lungs- mostly clear  CV- S1, S2 normal Abd-  +ve B.Sounds, Abd Soft, No tenderness,    Extremity/Skin:- Intact peripheral pulses   MSK- low back pain  with palpation and range of motion, bil lower extremity weakness    Data Review   CBC w Diff: Lab Results  Component Value Date   WBC 9.5 02/21/2017   HGB 11.5 (L) 02/21/2017   HCT 33.6 (L) 02/21/2017   PLT 238 02/21/2017   LYMPHOPCT 26 01/17/2011   MONOPCT 4 01/17/2011   EOSPCT 2 01/17/2011   BASOPCT 1 01/17/2011    CMP: Lab Results  Component Value Date   NA 137 02/21/2017   K 3.5 02/21/2017   CL 100 (L) 02/21/2017   CO2 28 02/21/2017   BUN 27 (H) 02/21/2017   CREATININE 0.86 02/21/2017    Total Discharge time is about 33 minutes  Jorden Minchey M.D on 02/21/2017 at 2:35 PM  [facility]   Office  [phone]  Voice Recognition Reubin Milan dictation system was used to create this note, attempts have been made to correct errors. Please contact the author with questions and/or clarifications.

## 2017-02-21 NOTE — Progress Notes (Signed)
CSW acknowledged consult for potential SNF placement. Pt worked up for SNF placement by previous [facility] working with pt, however insurance auth was denied (see previous CSW notes). Met with pt along with attending MD. MD pursuing peer-to-peer review of authorization request today. Pt understanding that without authorization, SNF/rehab would be private pay. Pt states this will not be option for her and would prefer to plan to DC home if unable to go to SNF with insurance covering. Provided [employer] peer-to-peer number for MD.  Will continue following.  Sharren Bridge, MSW, LCSW Clinical Social Work 02/21/2017 [phone]

## 2017-02-22 DIAGNOSIS — M519 Unspecified thoracic, thoracolumbar and lumbosacral intervertebral disc disorder: Secondary | ICD-10-CM | POA: Diagnosis not present

## 2017-02-22 DIAGNOSIS — I10 Essential (primary) hypertension: Secondary | ICD-10-CM | POA: Diagnosis not present

## 2017-02-23 ENCOUNTER — Other Ambulatory Visit: Payer: Self-pay | Admitting: *Deleted

## 2017-02-23 NOTE — Patient Outreach (Signed)
[facility]) Care Management  02/23/2017  Theresa Flowers [birth year] [id number]  Transition of Care Referral  Referral Date: 02/21/17 Referral Source: HTA referral Date of Discharge: 02/21/17 Facility: [facility] Discharge Diagnosis: Fall Insurance: HTA  Outreach attempt # 1 made to patient. Spouse answered the phone. He stated, patient was discharged from the hospital to [facility].   Plan: RN CM will notify [facility] administratio assistant of case status. RN CM will send referral to [facility] SW related to patient being discharged to [facility].   Wynelle Cleveland, RN, BSN, MHA/MSL, [facility] Telephonic Care Manager Coordinator [facility] Direct Phone: [phone] Toll Free: [phone] Fax: [phone]

## 2017-02-26 ENCOUNTER — Encounter: Payer: Self-pay | Admitting: *Deleted

## 2017-02-26 ENCOUNTER — Emergency Department (HOSPITAL_COMMUNITY): Payer: PPO

## 2017-02-26 ENCOUNTER — Encounter (HOSPITAL_COMMUNITY): Payer: Self-pay | Admitting: Emergency Medicine

## 2017-02-26 ENCOUNTER — Inpatient Hospital Stay (HOSPITAL_COMMUNITY)
Admission: EM | Admit: 2017-02-26 | Discharge: 2017-03-02 | DRG: 682 | Disposition: A | Discharge status: Skilled Nursing Facility | Payer: PPO | Attending: Family Medicine | Admitting: Family Medicine

## 2017-02-26 DIAGNOSIS — Z79891 Long term (current) use of opiate analgesic: Secondary | ICD-10-CM

## 2017-02-26 DIAGNOSIS — Z79899 Other long term (current) drug therapy: Secondary | ICD-10-CM | POA: Diagnosis not present

## 2017-02-26 DIAGNOSIS — M5441 Lumbago with sciatica, right side: Secondary | ICD-10-CM | POA: Diagnosis not present

## 2017-02-26 DIAGNOSIS — Z7401 Bed confinement status: Secondary | ICD-10-CM

## 2017-02-26 DIAGNOSIS — E7220 Disorder of urea cycle metabolism, unspecified: Secondary | ICD-10-CM | POA: Diagnosis not present

## 2017-02-26 DIAGNOSIS — M549 Dorsalgia, unspecified: Secondary | ICD-10-CM

## 2017-02-26 DIAGNOSIS — M5136 Other intervertebral disc degeneration, lumbar region: Secondary | ICD-10-CM | POA: Diagnosis not present

## 2017-02-26 DIAGNOSIS — M5134 Other intervertebral disc degeneration, thoracic region: Secondary | ICD-10-CM | POA: Diagnosis not present

## 2017-02-26 DIAGNOSIS — R4182 Altered mental status, unspecified: Secondary | ICD-10-CM

## 2017-02-26 DIAGNOSIS — M21372 Foot drop, left foot: Secondary | ICD-10-CM | POA: Diagnosis present

## 2017-02-26 DIAGNOSIS — G629 Polyneuropathy, unspecified: Secondary | ICD-10-CM | POA: Diagnosis present

## 2017-02-26 DIAGNOSIS — G8929 Other chronic pain: Secondary | ICD-10-CM | POA: Diagnosis present

## 2017-02-26 DIAGNOSIS — Z9181 History of falling: Secondary | ICD-10-CM | POA: Diagnosis not present

## 2017-02-26 DIAGNOSIS — M21371 Foot drop, right foot: Secondary | ICD-10-CM | POA: Diagnosis present

## 2017-02-26 DIAGNOSIS — G92 Toxic encephalopathy: Secondary | ICD-10-CM | POA: Diagnosis not present

## 2017-02-26 DIAGNOSIS — R339 Retention of urine, unspecified: Secondary | ICD-10-CM | POA: Diagnosis present

## 2017-02-26 DIAGNOSIS — E871 Hypo-osmolality and hyponatremia: Secondary | ICD-10-CM | POA: Diagnosis not present

## 2017-02-26 DIAGNOSIS — M545 Low back pain: Secondary | ICD-10-CM | POA: Diagnosis not present

## 2017-02-26 DIAGNOSIS — R531 Weakness: Secondary | ICD-10-CM

## 2017-02-26 DIAGNOSIS — G9340 Encephalopathy, unspecified: Secondary | ICD-10-CM

## 2017-02-26 DIAGNOSIS — D649 Anemia, unspecified: Secondary | ICD-10-CM | POA: Diagnosis present

## 2017-02-26 DIAGNOSIS — I10 Essential (primary) hypertension: Secondary | ICD-10-CM | POA: Diagnosis not present

## 2017-02-26 DIAGNOSIS — E860 Dehydration: Secondary | ICD-10-CM | POA: Diagnosis present

## 2017-02-26 DIAGNOSIS — R402431 Glasgow coma scale score 3-8, in the field [EMT or ambulance]: Secondary | ICD-10-CM | POA: Diagnosis not present

## 2017-02-26 DIAGNOSIS — N179 Acute kidney failure, unspecified: Secondary | ICD-10-CM | POA: Diagnosis not present

## 2017-02-26 LAB — CBC WITH DIFFERENTIAL/PLATELET
BASOS PCT: 0 %
Basophils Absolute: 0 10*3/uL (ref 0.0–0.1)
EOS ABS: 0.3 10*3/uL (ref 0.0–0.7)
Eosinophils Relative: 3 %
HEMATOCRIT: 28 % — AB (ref 36.0–46.0)
HEMOGLOBIN: 9.7 g/dL — AB (ref 12.0–15.0)
Lymphocytes Relative: 5 %
Lymphs Abs: 0.5 10*3/uL — ABNORMAL LOW (ref 0.7–4.0)
MCH: 29.5 pg (ref 26.0–34.0)
MCHC: 34.6 g/dL (ref 30.0–36.0)
MCV: 85.1 fL (ref 78.0–100.0)
Monocytes Absolute: 0.8 10*3/uL (ref 0.1–1.0)
Monocytes Relative: 7 %
NEUTROS ABS: 8.8 10*3/uL — AB (ref 1.7–7.7)
NEUTROS PCT: 85 %
Platelets: 283 10*3/uL (ref 150–400)
RBC: 3.29 MIL/uL — ABNORMAL LOW (ref 3.87–5.11)
RDW: 12.7 % (ref 11.5–15.5)
WBC: 10.3 10*3/uL (ref 4.0–10.5)

## 2017-02-26 LAB — ETHANOL: Alcohol, Ethyl (B): <5 mg/dL (ref <5)

## 2017-02-26 LAB — URINALYSIS, ROUTINE W REFLEX MICROSCOPIC
BILIRUBIN URINE: NEGATIVE
GLUCOSE, UA: NEGATIVE mg/dL
KETONES UR: NEGATIVE mg/dL
LEUKOCYTES UA: NEGATIVE
NITRITE: NEGATIVE
PH: 5 (ref 5.0–8.0)
Protein, ur: NEGATIVE mg/dL
RBC / HPF: NONE SEEN RBC/hpf (ref 0–5)
SPECIFIC GRAVITY, URINE: 1.016 (ref 1.005–1.030)

## 2017-02-26 LAB — CBG MONITORING, ED: Glucose-Capillary: 133 mg/dL — ABNORMAL HIGH (ref 65–99)

## 2017-02-26 LAB — COMPREHENSIVE METABOLIC PANEL
ALBUMIN: 2.8 g/dL — AB (ref 3.5–5.0)
ALK PHOS: 86 U/L (ref 38–126)
ALT: 50 U/L (ref 14–54)
ANION GAP: 12 (ref 5–15)
AST: 60 U/L — AB (ref 15–41)
BILIRUBIN TOTAL: 0.4 mg/dL (ref 0.3–1.2)
BUN: 115 mg/dL — AB (ref 6–20)
CALCIUM: 8.7 mg/dL — AB (ref 8.9–10.3)
CO2: 24 mmol/L (ref 22–32)
CREATININE: 3.46 mg/dL — AB (ref 0.44–1.00)
Chloride: 93 mmol/L — ABNORMAL LOW (ref 101–111)
GFR calc Af Amer: 14 mL/min — ABNORMAL LOW (ref >60)
GFR calc non Af Amer: 12 mL/min — ABNORMAL LOW (ref >60)
GLUCOSE: 128 mg/dL — AB (ref 65–99)
Potassium: 4.8 mmol/L (ref 3.5–5.1)
Sodium: 129 mmol/L — ABNORMAL LOW (ref 135–145)
TOTAL PROTEIN: 6.6 g/dL (ref 6.5–8.1)

## 2017-02-26 LAB — CARBAMAZEPINE LEVEL, TOTAL: Carbamazepine Lvl: 10.8 ug/mL (ref 4.0–12.0)

## 2017-02-26 LAB — RAPID URINE DRUG SCREEN, HOSP PERFORMED
Amphetamines: NOT DETECTED
BARBITURATES: NOT DETECTED
Benzodiazepines: NOT DETECTED
Cocaine: NOT DETECTED
Opiates: POSITIVE — AB
TETRAHYDROCANNABINOL: NOT DETECTED

## 2017-02-26 LAB — AMMONIA: Ammonia: 56 umol/L — ABNORMAL HIGH (ref 9–35)

## 2017-02-26 MED ORDER — SODIUM CHLORIDE 0.9 % IV BOLUS (SEPSIS)
1000.0000 mL | Freq: Once | INTRAVENOUS | Status: AC
  Administered 2017-02-26: 1000 mL via INTRAVENOUS

## 2017-02-26 MED ORDER — NALOXONE HCL 2 MG/2ML IJ SOSY
1.0000 mg | PREFILLED_SYRINGE | Freq: Once | INTRAMUSCULAR | Status: AC
  Administered 2017-02-26: 1 mg via INTRAVENOUS
  Filled 2017-02-26: qty 2

## 2017-02-26 NOTE — ED Provider Notes (Signed)
[facility]-EMERGENCY DEPT Provider Note   CSN: [id number] Arrival date & time: 02/26/17  1910     History   Chief Complaint Chief Complaint  Patient presents with  . Altered Mental Status  . Drug Overdose    possiable    Level V caveat due to altered mental status. HPI Theresa Flowers is a 71 y.o. female.  HPI Patient brought in for altered mental status. [facility]ortedly [facility]. EMS gave Narcan that woke her some. Reportedly was hypoxic and minimally breathing initially. Reportedly has had some adjusting of her pain control recently although report is that may have been adjusted down. Patient will not follow commands for me. Is speaking but is mostly nonsensical. Mild upper abdominal pain. Does move her extremities to pain. Breathing spontaneously.   Past Medical History:  Diagnosis Date  . Hypertension     Patient Active Problem List   Diagnosis Date Noted  . Low back pain radiating down leg 02/20/2017  . Neuropathy 02/20/2017  . Chronic back pain 02/20/2017  . Essential hypertension 02/20/2017    Past Surgical History:  Procedure Laterality Date  . ABDOMINAL HYSTERECTOMY    . ADENOIDECTOMY    . APPENDECTOMY    . TONSILLECTOMY      OB History    No data available       Home Medications    Prior to Admission medications   Medication Sig Start Date End Date Taking? Authorizing Provider  acetaminophen (TYLENOL) 500 MG tablet Take 1,000 mg by mouth 3 (three) times daily.   Yes [provider]  Amino Acids-Protein Hydrolys (FEEDING SUPPLEMENT, PRO-STAT SUGAR FREE 64,) LIQD Take 30 mLs by mouth 2 (two) times daily.   Yes [provider]  carbamazepine (TEGRETOL) 200 MG tablet Take 200 mg by mouth 3 (three) times daily.   Yes [provider]  Cholecalciferol (VITAMIN D3 PO) Take 1 tablet by mouth daily.   Yes [provider]  CRANBERRY PO Take 1 tablet by mouth daily.   Yes [provider]  DULoxetine  (CYMBALTA) 60 MG capsule Take 60 mg by mouth 2 (two) times daily.   Yes [provider]  gabapentin (NEURONTIN) 300 MG capsule Take 900-1,200 mg by mouth 3 (three) times daily. Take 900mg  by mouth twice daily, and take 1200mg  by mouth at bedtime 11/25/16  Yes [provider]  HYDROmorphone (DILAUDID) 4 MG tablet Take 1 tablet (4 mg total) by mouth every 4 (four) hours as needed for severe pain. 02/21/17  Yes Emokpae, Courage, MD  lisinopril (PRINIVIL,ZESTRIL) 20 MG tablet Take 20 mg by mouth daily.   Yes [provider]  Multiple Vitamin (MULTIVITAMIN WITH MINERALS) TABS tablet Take 1 tablet by mouth daily.   Yes [provider]  naproxen sodium (ANAPROX) 220 MG tablet Take 440 mg by mouth 2 (two) times daily with a meal.   Yes [provider]  Omega-3 Fatty Acids (FISH OIL PO) Take 1 capsule by mouth 2 (two) times daily.   Yes [provider]  ranitidine (ZANTAC) 75 MG tablet Take 75 mg by mouth 2 (two) times daily.   Yes [provider]  tiZANidine (ZANAFLEX) 4 MG tablet Take 4 mg by mouth 2 (two) times daily.    Yes [provider]  tiZANidine (ZANAFLEX) 4 MG tablet Take 4 mg by mouth 2 (two) times daily as needed for muscle spasms.   Yes [provider]  polyethylene glycol (MIRALAX / GLYCOLAX) packet Take 17  g by mouth daily as needed for mild constipation. 02/21/17   Shon Hale, MD    Family History Family History  Problem Relation Age of Onset  . Breast cancer Mother     Social History Social History  Substance Use Topics  . Smoking status: Never Smoker  . Smokeless tobacco: Never Used  . Alcohol use No     Allergies   Patient has no known allergies.   Review of Systems Review of Systems  Unable to perform ROS: Mental status change     Physical Exam Updated Vital Signs BP (!) 114/56 (BP Location: Left Arm)   Pulse 70   Temp 97.9 F (36.6 C) (Oral)   Resp (!) 22   SpO2 96%    Physical Exam  Constitutional: She appears well-developed.  HENT:  Head: Normocephalic.  Eyes: EOM are normal.  Cardiovascular: Normal rate.   Pulmonary/Chest: Effort normal. No respiratory distress.  Abdominal: There is tenderness.  Upper abdominal tenderness without rebound or guarding.  Neurological:  Patient is mumbling. Will say some clear words but will not answer questions. Will not follow commands. Pupils around 5 mm and reactive.  Skin: Capillary refill takes less than 2 seconds.   On reexam with family present patient is somewhat more awake. We will now follow commands. Although it does have some difficulty speaking. Difficulty moving her upper extremities. Will follow commands including two-step commands. Minimal movement of lower extremities which is chronic for her. Finger-nose is off and may have some ataxia. Family states that she has had difficulty with speaking over the last week.  ED Treatments / Results  Labs (all labs ordered are listed, but only abnormal results are displayed) Labs Reviewed  COMPREHENSIVE METABOLIC PANEL - Abnormal; Notable for the following:       Result Value   Sodium 129 (*)    Chloride 93 (*)    Glucose, Bld 128 (*)    BUN 115 (*)    Creatinine, Ser 3.46 (*)    Calcium 8.7 (*)    Albumin 2.8 (*)    AST 60 (*)    GFR calc non Af Amer 12 (*)    GFR calc Af Amer 14 (*)    All other components within normal limits  RAPID URINE DRUG SCREEN, HOSP PERFORMED - Abnormal; Notable for the following:    Opiates POSITIVE (*)    All other components within normal limits  CBC WITH DIFFERENTIAL/PLATELET - Abnormal; Notable for the following:    RBC 3.29 (*)    Hemoglobin 9.7 (*)    HCT 28.0 (*)    Neutro Abs 8.8 (*)    Lymphs Abs 0.5 (*)    All other components within normal limits  URINALYSIS, ROUTINE W REFLEX MICROSCOPIC - Abnormal; Notable for the following:    APPearance HAZY (*)    Hgb urine dipstick SMALL (*)    Bacteria, UA RARE (*)     Squamous Epithelial / LPF 0-5 (*)    All other components within normal limits  CBG MONITORING, ED - Abnormal; Notable for the following:    Glucose-Capillary 133 (*)    All other components within normal limits  ETHANOL  CARBAMAZEPINE LEVEL, TOTAL    EKG  EKG Interpretation  Date/Time:  Monday February 26 2017 19:28:45 EDT Ventricular Rate:  72 PR Interval:    QRS Duration: 112 QT Interval:  375 QTC Calculation: 411 R Axis:   -32 Text Interpretation:  Sinus rhythm Prolonged PR interval Borderline IVCD  with LAD Inferior infarct, old Consider anterior infarct Confirmed by Rubin Payor  MD, Rena Hunke [phone]) on 02/26/2017 9:57:07 PM       Radiology Dg Chest 1 View  Result Date: 02/26/2017 CLINICAL DATA:  Altered mental status EXAM: CHEST 1 VIEW COMPARISON:  None. FINDINGS: Heart is upper limits normal in size. Lungs are clear. No effusions or acute bony abnormality. IMPRESSION: No active disease. Electronically Signed   By: Charlett Nose M.D.   On: 02/26/2017 20:44   Ct Head Wo Contrast  Result Date: 02/26/2017 CLINICAL DATA:  Altered mental status EXAM: CT HEAD WITHOUT CONTRAST TECHNIQUE: Contiguous axial images were obtained from the base of the skull through the vertex without intravenous contrast. COMPARISON:  None. FINDINGS: Brain: No acute territorial infarction, hemorrhage or intracranial mass is seen. Mild to moderate periventricular and subcortical white matter hypodensity. Mild to moderate atrophy. Ventricles are nonenlarged. Vascular: No hyperdense vessels.  Carotid artery calcifications. Skull: No fracture or suspicious bone lesion. Sinuses/Orbits: No acute finding. Other: None IMPRESSION: 1. No CT evidence for acute intracranial abnormality. 2. Moderate periventricular and subcortical small vessel ischemic changes of the white matter. Electronically Signed   By: Jasmine Pang M.D.   On: 02/26/2017 20:35    Procedures Procedures (including critical care time)  Medications  Ordered in ED Medications  sodium chloride 0.9 % bolus 1,000 mL (not administered)  naloxone (NARCAN) injection 1 mg (1 mg Intravenous Given 02/26/17 2109)     Initial Impression / Assessment and Plan / ED Course  I have reviewed the triage vital signs and the nursing notes.  Pertinent labs & imaging results that were available during my care of the patient were reviewed by me and considered in my medical decision making (see chart for details).     Patient with altered mental status. Initially had been unresponsive earlier today. Improves somewhat with Narcan. Has reportedly had some difficulty speaking however over the last week. Some difficulty moving extremities. Creatinine found be elevated with mild anemia also. States she has had some urinary incontinence. Urine does not show infection. Head CT reassuring. Will admit to internal medicine.  Final Clinical Impressions(s) / ED Diagnoses   Final diagnoses:  Altered mental status, unspecified altered mental status type  Acute kidney injury [facility])    New Prescriptions New Prescriptions   No medications on file     Benjiman Core, MD 02/26/17 2225

## 2017-02-26 NOTE — ED Triage Notes (Signed)
Pt comes to ed via [place] [facility], [address], pt has was not responsive and altered mental status/ Ems gave 2 of narcan. Pt awake but confused and did express she wants to die. Pt vs 166/73, pulse 75, rr20, spo2 02 95. cbg 179. Hx of UTI.  Recent episodes of confusion and pain meds adjusted.

## 2017-02-26 NOTE — H&P (Signed)
Theresa Flowers YYT:[id number] DOB: 08/03/1946 DOA: 02/26/2017     PCP: Andreas Blower., MD  [facility]:  Followed by neurology (Dr. Darlen Round) in [facility]. She has seen Dr. Newell Coral previously  neurosurgery Patient coming from: From facility [facility]  Chief Complaint: Acute encephalopathy possible drug overdose  HPI: Theresa Flowers is a 71 y.o. female with medical history significant of chronic back pain, neuropathy, hypertension, multilevel degenerative disc disease of the lumbar spine   After being at the SNF for 5 days Patient was found down unresponsive minimal respiratory effort altered mental status EMS was called EMS administered 2 mg of Narcan at that point patient became alert and stated that she wants to die but then became somnolent again. Yesterday her dilaudid was decreased to 2 mg every 4 h because she was seemingly confused. She has not been eating or drinking much.  SNF reports she hasn't been eating well lately husband states that she has she has been taking less of her Dilaudid.  family endorses that she have had some tremors lately. At her baseline patient is clear minded but has no ability to feel any sensations below her knees or in her hands bilaterally. She has bilateral foot drop. Cyst secondary to long-standing neuropathy secondary to unclear etiology which has been worked up in the past extensively  Regarding pertinent Chronic problems:   Had a fall in October making her chronic back pain worse She underwent epidural injection on May 4, and did feel better, however, had another fall on May 5 and June she was admitted for severe back pain and at that time Lumbar MRI shows new T12/L1 acute disc extrusion, as well as old severe neuroforaminal narrowing and multilevel Lumbar disc disease. She was able to be discharged to SNF on 02/21/2017   She was discharged on Dilaudid 4 mg every 4 hours as needed for severe pain Neurontin 900 twice a day  and falls and 200 at bedtime Tegretol 200 mg 3 times a day unchanged from prior home medications.     IN ER:  Temp (24hrs), Avg:97.9 F (36.6 C), Min:97.9 F (36.6 C), Max:97.9 F (36.6 C)      on arrival  ED Triage Vitals [02/26/17 1929]  Enc Vitals Group     BP 138/64     Pulse Rate 73     Resp 12     Temp 97.9 F (36.6 C)     Temp Source Oral     SpO2 92 %     Weight      Height      Head Circumference      Peak Flow      Pain Score      Pain Loc      Pain Edu?      Excl. in GC?   RR 22 satting 96% on 2 L pulse 70 BP 114/56 Sodium 129 potassium 4.8 BUN 1:15 creatinine 3.46 Albumin 2.8 AST 60 ALT 15 WBC 10.3 hemoglobin 9.7 This history positive for opioids Chest x-ray nonacute CT head nonacute Following Medications were ordered in ER: Medications  sodium chloride 0.9 % bolus 1,000 mL (1,000 mLs Intravenous New Bag/Given 02/26/17 2234)  naloxone [facility]) injection 1 mg (1 mg Intravenous Given 02/26/17 2109)       Hospitalist was called for admission for Acute encephalopathy possibly secondary to overdose  Review of Systems:    Pertinent positives include: tremor, back pain  Constitutional:  No weight loss,  night sweats, Fevers, chills, fatigue, weight loss  HEENT:  No headaches, Difficulty swallowing,Tooth/dental problems,Sore throat,  No sneezing, itching, ear ache, nasal congestion, post nasal drip,  Cardio-vascular:  No chest pain, Orthopnea, PND, anasarca, dizziness, palpitations.no Bilateral lower extremity swelling  GI:  No heartburn, indigestion, abdominal pain, nausea, vomiting, diarrhea, change in bowel habits, loss of appetite, melena, blood in stool, hematemesis Resp:  no shortness of breath at rest. No dyspnea on exertion, No excess mucus, no productive cough, No non-productive cough, No coughing up of blood.No change in color of mucus.No wheezing. Skin:  no rash or lesions. No jaundice GU:  no dysuria, change in color of urine, no urgency or  frequency. No straining to urinate.  No flank pain.  Musculoskeletal:  No joint pain or no joint swelling. No decreased range of motion. No back pain.  Psych:  No change in mood or affect. No depression or anxiety. No memory loss.  Neuro: no localizing neurological complaints, no tingling, no weakness, no double vision, no gait abnormality, no slurred speech, no confusion  As per HPI otherwise 10 point review of systems negative.   Past Medical History: Past Medical History:  Diagnosis Date  . Hypertension    Past Surgical History:  Procedure Laterality Date  . ABDOMINAL HYSTERECTOMY    . ADENOIDECTOMY    . APPENDECTOMY    . TONSILLECTOMY       Social History:  Ambulatory   bed bound    reports that she has never smoked. She has never used smokeless tobacco. She reports that she does not drink alcohol or use drugs.  Allergies:  No Known Allergies     Family History:   Family History  Problem Relation Age of Onset  . Breast cancer Mother     Medications: Prior to Admission medications   Medication Sig Start Date End Date Taking? Authorizing Provider  acetaminophen (TYLENOL) 500 MG tablet Take 1,000 mg by mouth 3 (three) times daily.   Yes [provider]  Amino Acids-Protein Hydrolys (FEEDING SUPPLEMENT, PRO-STAT SUGAR FREE 64,) LIQD Take 30 mLs by mouth 2 (two) times daily.   Yes [provider]  carbamazepine (TEGRETOL) 200 MG tablet Take 200 mg by mouth 3 (three) times daily.   Yes [provider]  Cholecalciferol (VITAMIN D3 PO) Take 1 tablet by mouth daily.   Yes [provider]  CRANBERRY PO Take 1 tablet by mouth daily.   Yes [provider]  DULoxetine (CYMBALTA) 60 MG capsule Take 60 mg by mouth 2 (two) times daily.   Yes [provider]  gabapentin (NEURONTIN) 300 MG capsule Take 900-1,200 mg by mouth 3 (three) times daily. Take 900mg  by mouth twice daily, and take 1200mg  by mouth at bedtime 11/25/16   Yes [provider]  HYDROmorphone (DILAUDID) 4 MG tablet Take 1 tablet (4 mg total) by mouth every 4 (four) hours as needed for severe pain. 02/21/17  Yes Emokpae, Courage, MD  lisinopril (PRINIVIL,ZESTRIL) 20 MG tablet Take 20 mg by mouth daily.   Yes [provider]  Multiple Vitamin (MULTIVITAMIN WITH MINERALS) TABS tablet Take 1 tablet by mouth daily.   Yes [provider]  naproxen sodium (ANAPROX) 220 MG tablet Take 440 mg by mouth 2 (two) times daily with a meal.   Yes [provider]  Omega-3 Fatty Acids (FISH OIL PO) Take 1 capsule by mouth 2 (two) times daily.   Yes [provider]  ranitidine (ZANTAC) 75 MG tablet Take  75 mg by mouth 2 (two) times daily.   Yes [provider]  tiZANidine (ZANAFLEX) 4 MG tablet Take 4 mg by mouth 2 (two) times daily.    Yes [provider]  tiZANidine (ZANAFLEX) 4 MG tablet Take 4 mg by mouth 2 (two) times daily as needed for muscle spasms.   Yes [provider]  polyethylene glycol (MIRALAX / GLYCOLAX) packet Take 17 g by mouth daily as needed for mild constipation. 02/21/17   Shon Hale, MD    Physical Exam: Patient Vitals for the past 24 hrs:  BP Temp Temp src Pulse Resp SpO2  02/26/17 2132 (!) 114/56 - - 70 (!) 22 96 %  02/26/17 1929 138/64 97.9 F (36.6 C) Oral 73 12 92 %    1. General:  in No Acute distress 2. Psychological: somnolent and not  Oriented 3. Head/ENT:    Dry Mucous Membranes                          Head Non traumatic, neck supple                           Poor Dentition 4. SKIN:   decreased Skin turgor,  Skin clean Dry and intact no rash 5. Heart: Regular rate and rhythm no  Murmur, Rub or gallop 6. Lungs:  Clear to auscultation bilaterally, no wheezes or crackles   7. Abdomen: Soft,  non-tender, Non distended 8. Lower extremities: no clubbing, cyanosis, or edema 9. NeurologicallyDiminished sensation throughout upper and lower extremity. Strength  appears to be diminished in upper extremities grip bilaterally and equally. Cranial nerves II through XII intact 10. MSK: Normal range of motion   body mass index is unknown because there is no height or weight on file.  Labs on Admission:   Labs on Admission: I have personally reviewed following labs and imaging studies  CBC:  Recent Labs Lab 02/21/17 1158 02/26/17 2053  WBC 9.5 10.3  NEUTROABS  --  8.8*  HGB 11.5* 9.7*  HCT 33.6* 28.0*  MCV 85.3 85.1  PLT 238 283   Basic Metabolic Panel:  Recent Labs Lab 02/21/17 1158 02/26/17 2053  NA 137 129*  K 3.5 4.8  CL 100* 93*  CO2 28 24  GLUCOSE 139* 128*  BUN 27* 115*  CREATININE 0.86 3.46*  CALCIUM 9.3 8.7*   GFR: Estimated Creatinine Clearance: 17.4 mL/min (A) (by C-G formula based on SCr of 3.46 mg/dL (H)). Liver Function Tests:  Recent Labs Lab 02/26/17 2053  AST 60*  ALT 50  ALKPHOS 86  BILITOT 0.4  PROT 6.6  ALBUMIN 2.8*   No results for input(s): LIPASE, AMYLASE in the last 168 hours. No results for input(s): AMMONIA in the last 168 hours. Coagulation Profile: No results for input(s): INR, PROTIME in the last 168 hours. Cardiac Enzymes: No results for input(s): CKTOTAL, CKMB, CKMBINDEX, TROPONINI in the last 168 hours. BNP (last 3 results) No results for input(s): PROBNP in the last 8760 hours. HbA1C: No results for input(s): HGBA1C in the last 72 hours. CBG:  Recent Labs Lab 02/26/17 1928  GLUCAP 133*   Lipid Profile: No results for input(s): CHOL, HDL, LDLCALC, TRIG, CHOLHDL, LDLDIRECT in the last 72 hours. Thyroid Function Tests: No results for input(s): TSH, T4TOTAL, FREET4, T3FREE, THYROIDAB in the last 72 hours. Anemia Panel: No results for input(s): VITAMINB12, FOLATE, FERRITIN, TIBC, IRON, RETICCTPCT in the last  72 hours. Urine analysis:    Component Value Date/Time   COLORURINE YELLOW 02/26/2017 2053   APPEARANCEUR HAZY (A) 02/26/2017 2053   LABSPEC 1.016 02/26/2017 2053    PHURINE 5.0 02/26/2017 2053   GLUCOSEU NEGATIVE 02/26/2017 2053   HGBUR SMALL (A) 02/26/2017 2053   BILIRUBINUR NEGATIVE 02/26/2017 2053   KETONESUR NEGATIVE 02/26/2017 2053   PROTEINUR NEGATIVE 02/26/2017 2053   NITRITE NEGATIVE 02/26/2017 2053   LEUKOCYTESUR NEGATIVE 02/26/2017 2053   Sepsis Labs: @LABRCNTIP (procalcitonin:4,lacticidven:4) )No results found for this or any previous visit (from the past 240 hour(s)).     UA   no evidence of UTI    No results found for: HGBA1C  Estimated Creatinine Clearance: 17.4 mL/min (A) (by C-G formula based on SCr of 3.46 mg/dL (H)).  BNP (last 3 results) No results for input(s): PROBNP in the last 8760 hours.   ECG REPORT  Independently reviewed Rate: 72  Rhythm: Sinus rhythm with prolonged PR ST&T Change: No acute ischemic changes   QTC 411  There were no vitals filed for this visit.   Cultures: No results found for: SDES, SPECREQUEST, CULT, REPTSTATUS   Radiological Exams on Admission: Dg Chest 1 View  Result Date: 02/26/2017 CLINICAL DATA:  Altered mental status EXAM: CHEST 1 VIEW COMPARISON:  None. FINDINGS: Heart is upper limits normal in size. Lungs are clear. No effusions or acute bony abnormality. IMPRESSION: No active disease. Electronically Signed   By: 02/28/2017 M.D.   On: 02/26/2017 20:44   Ct Head Wo Contrast  Result Date: 02/26/2017 CLINICAL DATA:  Altered mental status EXAM: CT HEAD WITHOUT CONTRAST TECHNIQUE: Contiguous axial images were obtained from the base of the skull through the vertex without intravenous contrast. COMPARISON:  None. FINDINGS: Brain: No acute territorial infarction, hemorrhage or intracranial mass is seen. Mild to moderate periventricular and subcortical white matter hypodensity. Mild to moderate atrophy. Ventricles are nonenlarged. Vascular: No hyperdense vessels.  Carotid artery calcifications. Skull: No fracture or suspicious bone lesion. Sinuses/Orbits: No acute finding. Other: None  IMPRESSION: 1. No CT evidence for acute intracranial abnormality. 2. Moderate periventricular and subcortical small vessel ischemic changes of the white matter. Electronically Signed   By: 02/28/2017 M.D.   On: 02/26/2017 20:35   02/28/2017 Renal  Result Date: 02/27/2017 CLINICAL DATA:  Renal failure EXAM: RENAL / URINARY TRACT ULTRASOUND COMPLETE COMPARISON:  MRI 01/17/2011, CT 02/19/2017. FINDINGS: Right Kidney: Length: 9.9 cm. Increased echogenicity consistent with medical renal disease. No hydronephrosis. No perinephric collection. No focal renal parenchymal lesion. Left Kidney: Length: 11.0 cm. Increased echogenicity consistent with medical renal disease. Complex 4 cm cyst, probably unchanged from 01/17/2011 MRI. No hydronephrosis. Bladder: Appears normal for degree of bladder distention. IMPRESSION: Echogenic renal parenchyma consistent with medical renal disease. No hydronephrosis. Complex upper pole left renal cyst is probably unchanged from 2012 although not conclusively characterized. Dedicated renal MRI may be considered for optimal characterization. Electronically Signed   By: 2013 M.D.   On: 02/27/2017 00:52    Chart has been reviewed    Assessment/Plan  71 y.o. female with medical history significant of chronic back pain, neuropathy, hypertension, multilevel degenerative disc disease of the lumbar spine   Admitted for Acute encephalopathy possibly secondary to the hydration versus polypharmacy complicated by acute renal failure   Present on Admission: . Acute renal failure (HCC) - improved with narcan and rehydration likely secondary to dehydration, check FeNA and if not improved with IVF would renal 62 worrisome for renal disease  would obtain renal consult if No significant improvement despite rehydration  hold ACE inhibitor Renal cyst - once patient is stable and able to cooperate with examination can obtain renal protocol MRI . Hyponatremia - most likely secondary to  dehydration will rehydrate and follow urine electrolytes . Essential hypertension -  hold home medications and rehydrate . Dehydration administer IV fluids this is likely contributing to acute encephalopathy . Anemia . Chronic back pain . Acute encephalopathy-   - most likely multifactorial secondary to combination of  dehydration secondary to decreased by mouth intake and polypharmacy   - Will rehydrate   - Hold contributing medications   - if no improvement may need further imaging to evaluate for CNS pathology Tainted retold level hold Dilaudid Neurontin and Tegretol for now  Neuropathy unclear etiology chronic resulting in severe debility will continue PT OT evaluation   Other plan as per orders.  DVT prophylaxis:  SCD   Code Status:  FULL CODE   as per  family   Family Communication:   Family at  Bedside  plan of care was discussed with  Son and Husband   Disposition Plan:                              Back to current facility when stable                                                  Social Work   Nutrition  consulted                          Consults called: none  Admission status:  Inpatient    Level of care       tele      I have spent a total of 56 min on this admission   Herchel Hopkin 02/27/2017, 1:53 AM    [facility]  Pager [phone]   after 2 AM please page floor coverage PA If 7AM-7PM, please contact the day team taking care of the patient  [email or web address]  Password [email or web address]

## 2017-02-26 NOTE — ED Notes (Signed)
Family verbalizes her cognition is completely different

## 2017-02-27 ENCOUNTER — Encounter (HOSPITAL_COMMUNITY): Payer: Self-pay | Admitting: Internal Medicine

## 2017-02-27 ENCOUNTER — Other Ambulatory Visit: Payer: Self-pay | Admitting: *Deleted

## 2017-02-27 ENCOUNTER — Inpatient Hospital Stay (HOSPITAL_COMMUNITY): Payer: PPO

## 2017-02-27 DIAGNOSIS — M50223 Other cervical disc displacement at C6-C7 level: Secondary | ICD-10-CM | POA: Diagnosis not present

## 2017-02-27 DIAGNOSIS — D649 Anemia, unspecified: Secondary | ICD-10-CM | POA: Diagnosis not present

## 2017-02-27 DIAGNOSIS — N281 Cyst of kidney, acquired: Secondary | ICD-10-CM | POA: Diagnosis not present

## 2017-02-27 DIAGNOSIS — N179 Acute kidney failure, unspecified: Secondary | ICD-10-CM | POA: Diagnosis not present

## 2017-02-27 DIAGNOSIS — E860 Dehydration: Secondary | ICD-10-CM | POA: Diagnosis not present

## 2017-02-27 DIAGNOSIS — R4182 Altered mental status, unspecified: Secondary | ICD-10-CM | POA: Diagnosis not present

## 2017-02-27 DIAGNOSIS — G9340 Encephalopathy, unspecified: Secondary | ICD-10-CM | POA: Diagnosis not present

## 2017-02-27 DIAGNOSIS — G92 Toxic encephalopathy: Secondary | ICD-10-CM | POA: Diagnosis not present

## 2017-02-27 DIAGNOSIS — M21372 Foot drop, left foot: Secondary | ICD-10-CM | POA: Diagnosis not present

## 2017-02-27 DIAGNOSIS — Z79891 Long term (current) use of opiate analgesic: Secondary | ICD-10-CM | POA: Diagnosis not present

## 2017-02-27 DIAGNOSIS — Z9181 History of falling: Secondary | ICD-10-CM | POA: Diagnosis not present

## 2017-02-27 DIAGNOSIS — I10 Essential (primary) hypertension: Secondary | ICD-10-CM | POA: Diagnosis not present

## 2017-02-27 DIAGNOSIS — M5134 Other intervertebral disc degeneration, thoracic region: Secondary | ICD-10-CM | POA: Diagnosis not present

## 2017-02-27 DIAGNOSIS — R339 Retention of urine, unspecified: Secondary | ICD-10-CM | POA: Diagnosis not present

## 2017-02-27 DIAGNOSIS — G629 Polyneuropathy, unspecified: Secondary | ICD-10-CM | POA: Diagnosis not present

## 2017-02-27 DIAGNOSIS — G8929 Other chronic pain: Secondary | ICD-10-CM | POA: Diagnosis not present

## 2017-02-27 DIAGNOSIS — Z79899 Other long term (current) drug therapy: Secondary | ICD-10-CM | POA: Diagnosis not present

## 2017-02-27 DIAGNOSIS — M21371 Foot drop, right foot: Secondary | ICD-10-CM | POA: Diagnosis not present

## 2017-02-27 DIAGNOSIS — M5136 Other intervertebral disc degeneration, lumbar region: Secondary | ICD-10-CM | POA: Diagnosis not present

## 2017-02-27 DIAGNOSIS — E871 Hypo-osmolality and hyponatremia: Secondary | ICD-10-CM | POA: Diagnosis not present

## 2017-02-27 DIAGNOSIS — R2689 Other abnormalities of gait and mobility: Secondary | ICD-10-CM | POA: Diagnosis not present

## 2017-02-27 DIAGNOSIS — M5441 Lumbago with sciatica, right side: Secondary | ICD-10-CM | POA: Diagnosis not present

## 2017-02-27 DIAGNOSIS — Z7401 Bed confinement status: Secondary | ICD-10-CM | POA: Diagnosis not present

## 2017-02-27 DIAGNOSIS — M545 Low back pain: Secondary | ICD-10-CM | POA: Diagnosis not present

## 2017-02-27 DIAGNOSIS — E7220 Disorder of urea cycle metabolism, unspecified: Secondary | ICD-10-CM | POA: Diagnosis not present

## 2017-02-27 LAB — SODIUM, URINE, RANDOM: SODIUM UR: 34 mmol/L

## 2017-02-27 LAB — CBC
HCT: 29 % — ABNORMAL LOW (ref 36.0–46.0)
Hemoglobin: 10.1 g/dL — ABNORMAL LOW (ref 12.0–15.0)
MCH: 29.6 pg (ref 26.0–34.0)
MCHC: 34.8 g/dL (ref 30.0–36.0)
MCV: 85 fL (ref 78.0–100.0)
Platelets: 273 10*3/uL (ref 150–400)
RBC: 3.41 MIL/uL — ABNORMAL LOW (ref 3.87–5.11)
RDW: 12.6 % (ref 11.5–15.5)
WBC: 9.5 10*3/uL (ref 4.0–10.5)

## 2017-02-27 LAB — COMPREHENSIVE METABOLIC PANEL
ALBUMIN: 2.8 g/dL — AB (ref 3.5–5.0)
ALK PHOS: 90 U/L (ref 38–126)
ALT: 50 U/L (ref 14–54)
AST: 52 U/L — AB (ref 15–41)
Anion gap: 12 (ref 5–15)
BILIRUBIN TOTAL: 0.5 mg/dL (ref 0.3–1.2)
BUN: 108 mg/dL — AB (ref 6–20)
CALCIUM: 8.8 mg/dL — AB (ref 8.9–10.3)
CO2: 24 mmol/L (ref 22–32)
CREATININE: 2.79 mg/dL — AB (ref 0.44–1.00)
Chloride: 97 mmol/L — ABNORMAL LOW (ref 101–111)
GFR calc Af Amer: 19 mL/min — ABNORMAL LOW (ref >60)
GFR calc non Af Amer: 16 mL/min — ABNORMAL LOW (ref >60)
GLUCOSE: 107 mg/dL — AB (ref 65–99)
Potassium: 4.4 mmol/L (ref 3.5–5.1)
Sodium: 133 mmol/L — ABNORMAL LOW (ref 135–145)
TOTAL PROTEIN: 6.7 g/dL (ref 6.5–8.1)

## 2017-02-27 LAB — OSMOLALITY, URINE: Osmolality, Ur: 385 mOsm/kg (ref 300–900)

## 2017-02-27 LAB — PHOSPHORUS: Phosphorus: 6.1 mg/dL — ABNORMAL HIGH (ref 2.5–4.6)

## 2017-02-27 LAB — CREATININE, URINE, RANDOM: Creatinine, Urine: 88.38 mg/dL

## 2017-02-27 LAB — AMMONIA: AMMONIA: 11 umol/L (ref 9–35)

## 2017-02-27 LAB — MAGNESIUM: Magnesium: 2.4 mg/dL (ref 1.7–2.4)

## 2017-02-27 LAB — TSH: TSH: 0.994 u[IU]/mL (ref 0.350–4.500)

## 2017-02-27 MED ORDER — SODIUM CHLORIDE 0.9% FLUSH
3.0000 mL | Freq: Two times a day (BID) | INTRAVENOUS | Status: DC
  Administered 2017-02-27 – 2017-03-02 (×5): 3 mL via INTRAVENOUS

## 2017-02-27 MED ORDER — DULOXETINE HCL 60 MG PO CPEP
60.0000 mg | ORAL_CAPSULE | Freq: Two times a day (BID) | ORAL | Status: DC
  Administered 2017-02-27 – 2017-03-02 (×7): 60 mg via ORAL
  Filled 2017-02-27 (×7): qty 1

## 2017-02-27 MED ORDER — ACETAMINOPHEN 325 MG PO TABS
650.0000 mg | ORAL_TABLET | Freq: Four times a day (QID) | ORAL | Status: DC | PRN
  Administered 2017-02-27: 650 mg via ORAL
  Filled 2017-02-27 (×2): qty 2

## 2017-02-27 MED ORDER — ACETAMINOPHEN 650 MG RE SUPP: 650.0000 mg | Freq: Four times a day (QID) | RECTAL | Status: DC | PRN

## 2017-02-27 MED ORDER — HYDROMORPHONE HCL 1 MG/ML IJ SOLN
0.5000 mg | INTRAMUSCULAR | Status: DC | PRN
Start: 2017-02-27 — End: 2017-02-27
  Administered 2017-02-27: 0.5 mg via INTRAVENOUS
  Filled 2017-02-27: qty 0.5

## 2017-02-27 MED ORDER — HYDROMORPHONE HCL 1 MG/ML IJ SOLN
1.0000 mg | INTRAMUSCULAR | Status: DC | PRN
  Administered 2017-02-27 – 2017-02-28 (×4): 1 mg via INTRAVENOUS
  Filled 2017-02-27 (×9): qty 1

## 2017-02-27 MED ORDER — LACTULOSE 10 GM/15ML PO SOLN
10.0000 g | Freq: Two times a day (BID) | ORAL | Status: DC
  Administered 2017-02-27 – 2017-03-02 (×8): 10 g via ORAL
  Filled 2017-02-27 (×8): qty 30

## 2017-02-27 MED ORDER — HEPARIN SODIUM (PORCINE) 5000 UNIT/ML IJ SOLN
5000.0000 [IU] | Freq: Three times a day (TID) | INTRAMUSCULAR | Status: DC
  Administered 2017-02-27 – 2017-03-02 (×10): 5000 [IU] via SUBCUTANEOUS
  Filled 2017-02-27 (×9): qty 1

## 2017-02-27 MED ORDER — THIAMINE HCL 100 MG/ML IJ SOLN
100.0000 mg | Freq: Every day | INTRAMUSCULAR | Status: DC
  Administered 2017-02-27 – 2017-02-28 (×2): 100 mg via INTRAVENOUS
  Filled 2017-02-27 (×3): qty 2

## 2017-02-27 MED ORDER — POLYETHYLENE GLYCOL 3350 17 G PO PACK: 17.0000 g | PACK | Freq: Every day | ORAL | Status: DC | PRN

## 2017-02-27 MED ORDER — ONDANSETRON HCL 4 MG/2ML IJ SOLN
4.0000 mg | Freq: Four times a day (QID) | INTRAMUSCULAR | Status: DC | PRN
  Administered 2017-02-27 – 2017-02-28 (×2): 4 mg via INTRAVENOUS
  Filled 2017-02-27 (×2): qty 2

## 2017-02-27 MED ORDER — SODIUM CHLORIDE 0.9 % IV SOLN
INTRAVENOUS | Status: AC
  Administered 2017-02-27: 13:00:00 via INTRAVENOUS

## 2017-02-27 MED ORDER — CARBAMAZEPINE 200 MG PO TABS
200.0000 mg | ORAL_TABLET | Freq: Three times a day (TID) | ORAL | Status: DC
  Administered 2017-02-27 – 2017-03-02 (×10): 200 mg via ORAL
  Filled 2017-02-27 (×10): qty 1

## 2017-02-27 MED ORDER — SODIUM CHLORIDE 0.9 % IV SOLN
INTRAVENOUS | Status: AC
  Administered 2017-02-27: 03:00:00 via INTRAVENOUS

## 2017-02-27 MED ORDER — FAMOTIDINE 20 MG PO TABS
10.0000 mg | ORAL_TABLET | Freq: Every day | ORAL | Status: DC
  Administered 2017-02-27 – 2017-03-02 (×4): 10 mg via ORAL
  Filled 2017-02-27 (×4): qty 1

## 2017-02-27 MED ORDER — ONDANSETRON HCL 4 MG PO TABS
4.0000 mg | ORAL_TABLET | Freq: Four times a day (QID) | ORAL | Status: DC | PRN
  Administered 2017-03-01 – 2017-03-02 (×2): 4 mg via ORAL
  Filled 2017-02-27 (×2): qty 1

## 2017-02-27 MED ORDER — POLYETHYLENE GLYCOL 3350 17 G PO PACK
17.0000 g | PACK | Freq: Every day | ORAL | Status: DC
  Administered 2017-02-27 – 2017-03-02 (×4): 17 g via ORAL
  Filled 2017-02-27 (×3): qty 1

## 2017-02-27 MED ORDER — GABAPENTIN 300 MG PO CAPS
300.0000 mg | ORAL_CAPSULE | Freq: Three times a day (TID) | ORAL | Status: DC
  Administered 2017-02-27 – 2017-02-28 (×4): 300 mg via ORAL
  Filled 2017-02-27 (×3): qty 1

## 2017-02-27 MED ORDER — BOOST / RESOURCE BREEZE PO LIQD
1.0000 | Freq: Three times a day (TID) | ORAL | Status: DC
  Administered 2017-02-27 – 2017-03-02 (×9): 1 via ORAL

## 2017-02-27 MED ORDER — METHOCARBAMOL 500 MG PO TABS
750.0000 mg | ORAL_TABLET | Freq: Three times a day (TID) | ORAL | Status: DC
  Administered 2017-02-27 – 2017-03-02 (×10): 750 mg via ORAL
  Filled 2017-02-27 (×10): qty 2

## 2017-02-27 NOTE — Progress Notes (Signed)
OT Cancellation Note  Patient Details Name: Theresa Flowers MRN: [id number] DOB: [birth year]   Cancelled Treatment:    Reason Eval/Treat Not Completed: Other (comment). Pt from SNF and plan is to return to SNF. Will defer further OT to SNF.   [facility] Evalynn Hankins, OT/L  [phone] 02/27/2017 02/27/2017, 3:05 PM

## 2017-02-27 NOTE — Progress Notes (Signed)
PROGRESS NOTE [facility]   Theresa Flowers   YIF:[id number] DOB: [birth year]  DOA: 02/26/2017 PCP: Andreas Blower., MD   Brief Narrative:  71 y.o. Female with significant hx of neuropathy, HTN, and multilevel degenerative disc disease of the lumbar spine presented to ED after she was found unresponisve at [facility]. EMS administered 2 mg of narcan at which point the patient became alert. SNF states she hasn't been eating well. Her dilaudid was decreased to 2 mg every 4h in the SNF because of confusion. Patient has had tremors prior to entering hospital. Patient has a hard time coming up with the words she wants to say. Patient also had trouble holding a phone in her right her this afternoon. Her neuropathy has an unclear etiology.   Assessment & Plan:   Active Problems:   Neuropathy   Chronic back pain   Essential hypertension   Acute renal failure (HCC)   Hyponatremia   Dehydration   Anemia   Acute encephalopathy   ARF (acute renal failure) (HCC)  Acute Renal Failure:  Likely secondary to dehydration. Hold ACEI.  Bladder scan (+). Place foley.   Hyponatremia: likely secondary to dehydration. IVF.  Essential HTN: stable. Hold home meds. Monitor.  Dehydration: IVF. Could be contributing to acute encephalopathy  Acute encephalopathy: unknown baseline. Patient states she has always had trouble finding words. Tx dehydration. Monitor. MRI since patient was having dexterity problems with her right hand.   DVT prophylaxis: SCDs Code Status:FULL Family Communication: None at bedside Disposition Plan: back to SNF when stable   Consultants:   None  Procedures:   Bladder scan  Antimicrobials:  None  Subjective: Patient was seen and examined. Patient has trouble coming up with words during the exam. Patient states she is feeling better, but nurse says the patient occasionally screams out in pain. Pt denies headache, N/V, SOB, chest pain.  Objective: Vitals:   02/27/17  0028 02/27/17 0147 02/27/17 0159 02/27/17 0635  BP: 107/64 140/61  127/62  Pulse: 71 81  82  Resp: 12 16  17   Temp:  98 F (36.7 C)  97.9 F (36.6 C)  TempSrc:  Oral  Oral  SpO2: 97% 97%  94%  Weight:   193 lb 5.5 oz (87.7 kg)   Height:   5\' 9"  (1.753 m)     Intake/Output Summary (Last 24 hours) at 02/27/17 1121 Last data filed at 02/27/17 0600  Gross per 24 hour  Intake           296.67 ml  Output                0 ml  Net           296.67 ml   Filed Weights   02/27/17 0159  Weight: 193 lb 5.5 oz (87.7 kg)    Examination:  General exam: Appears confused but in no pain or acute distress Respiratory system: Clear to auscultation. No wheezes,crackle or rhonchi Cardiovascular system: S1 & S2 heard, RRR. No JVD, murmurs, rubs or gallops Gastrointestinal system: Abdomen is nondistended, soft. Lower abdomin TTP. No organomegaly or masses felt. Normal bowel sounds heard. Central nervous system: Alert and oriented. No focal neurological deficits. Extremities: No pedal edema. Symmetric, strength 5/5 in upper extremities. Patient has decreased ROM in her right hand/fingers.  Skin: No rashes, lesions or ulcers Psychiatry: Judgement and insight appear normal. Mood & affect appropriate.    Data Reviewed: I have personally reviewed following labs and imaging studies  CBC:  Recent Labs Lab 02/21/17 1158 02/26/17 2053 02/27/17 0459  WBC 9.5 10.3 9.5  NEUTROABS  --  8.8*  --   HGB 11.5* 9.7* 10.1*  HCT 33.6* 28.0* 29.0*  MCV 85.3 85.1 85.0  PLT 238 283 273   Basic Metabolic Panel:  Recent Labs Lab 02/21/17 1158 02/26/17 2053 02/27/17 0459  NA 137 129* 133*  K 3.5 4.8 4.4  CL 100* 93* 97*  CO2 28 24 24   GLUCOSE 139* 128* 107*  BUN 27* 115* 108*  CREATININE 0.86 3.46* 2.79*  CALCIUM 9.3 8.7* 8.8*  MG  --   --  2.4  PHOS  --   --  6.1*   GFR: Estimated Creatinine Clearance: 21.8 mL/min (A) (by C-G formula based on SCr of 2.79 mg/dL (H)). Liver Function  Tests:  Recent Labs Lab 02/26/17 2053 02/27/17 0459  AST 60* 52*  ALT 50 50  ALKPHOS 86 90  BILITOT 0.4 0.5  PROT 6.6 6.7  ALBUMIN 2.8* 2.8*   No results for input(s): LIPASE, AMYLASE in the last 168 hours.  Recent Labs Lab 02/26/17 2227 02/27/17 0459  AMMONIA 56* 11   Coagulation Profile: No results for input(s): INR, PROTIME in the last 168 hours. Cardiac Enzymes: No results for input(s): CKTOTAL, CKMB, CKMBINDEX, TROPONINI in the last 168 hours. BNP (last 3 results) No results for input(s): PROBNP in the last 8760 hours. HbA1C: No results for input(s): HGBA1C in the last 72 hours. CBG:  Recent Labs Lab 02/26/17 1928  GLUCAP 133*   Lipid Profile: No results for input(s): CHOL, HDL, LDLCALC, TRIG, CHOLHDL, LDLDIRECT in the last 72 hours. Thyroid Function Tests:  Recent Labs  02/27/17 0459  TSH 0.994   Anemia Panel: No results for input(s): VITAMINB12, FOLATE, FERRITIN, TIBC, IRON, RETICCTPCT in the last 72 hours. Sepsis Labs: No results for input(s): PROCALCITON, LATICACIDVEN in the last 168 hours.  No results found for this or any previous visit (from the past 240 hour(s)).       Radiology Studies: Dg Chest 1 View  Result Date: 02/26/2017 CLINICAL DATA:  Altered mental status EXAM: CHEST 1 VIEW COMPARISON:  None. FINDINGS: Heart is upper limits normal in size. Lungs are clear. No effusions or acute bony abnormality. IMPRESSION: No active disease. Electronically Signed   By: 02/28/2017 M.D.   On: 02/26/2017 20:44   Ct Head Wo Contrast  Result Date: 02/26/2017 CLINICAL DATA:  Altered mental status EXAM: CT HEAD WITHOUT CONTRAST TECHNIQUE: Contiguous axial images were obtained from the base of the skull through the vertex without intravenous contrast. COMPARISON:  None. FINDINGS: Brain: No acute territorial infarction, hemorrhage or intracranial mass is seen. Mild to moderate periventricular and subcortical white matter hypodensity. Mild to moderate  atrophy. Ventricles are nonenlarged. Vascular: No hyperdense vessels.  Carotid artery calcifications. Skull: No fracture or suspicious bone lesion. Sinuses/Orbits: No acute finding. Other: None IMPRESSION: 1. No CT evidence for acute intracranial abnormality. 2. Moderate periventricular and subcortical small vessel ischemic changes of the white matter. Electronically Signed   By: 02/28/2017 M.D.   On: 02/26/2017 20:35   02/28/2017 Renal  Result Date: 02/27/2017 CLINICAL DATA:  Renal failure EXAM: RENAL / URINARY TRACT ULTRASOUND COMPLETE COMPARISON:  MRI 01/17/2011, CT 02/19/2017. FINDINGS: Right Kidney: Length: 9.9 cm. Increased echogenicity consistent with medical renal disease. No hydronephrosis. No perinephric collection. No focal renal parenchymal lesion. Left Kidney: Length: 11.0 cm. Increased echogenicity consistent with medical renal disease. Complex 4 cm cyst, probably unchanged  from 01/17/2011 MRI. No hydronephrosis. Bladder: Appears normal for degree of bladder distention. IMPRESSION: Echogenic renal parenchyma consistent with medical renal disease. No hydronephrosis. Complex upper pole left renal cyst is probably unchanged from 2012 although not conclusively characterized. Dedicated renal MRI may be considered for optimal characterization. Electronically Signed   By: Ellery Plunk M.D.   On: 02/27/2017 00:52      Scheduled Meds: . famotidine  10 mg Oral Daily  . lactulose  10 g Oral BID  . sodium chloride flush  3 mL Intravenous Q12H  . thiamine  100 mg Intravenous Daily   Continuous Infusions: . sodium chloride 100 mL/hr at 02/27/17 0302     LOS: 0 days     Eldin Bonsell, Pa-s  If 7PM-7AM, please contact night-coverage [email or web address] Password [facility] 02/27/2017, 11:21 AM

## 2017-02-27 NOTE — Evaluation (Signed)
Physical Therapy Evaluation Patient Details Name: Theresa Flowers MRN: [id number] DOB: [birth year] Today's Date: 02/27/2017   History of Present Illness  71 y/o admitted with Acute encephalopathy likely multifactorial due to dehydration and polypharmacy and received narcan which improved her alertness. PMH: peripheral neuropathy (numb from B knees down and B wrists/hands), DDD.  CT shows no acute findings and MRI pending  Clinical Impression  Pt admitted with above diagnosis. Pt currently with functional limitations due to the deficits listed below (see PT Problem List).  Pt will benefit from skilled PT to increase their independence and safety with mobility to allow discharge to the venue listed below. Pt unable to tolerate much of any movement and is TOT A of 2 for all mobility even rolling.  Pt with limited LE AAROM with L ROM < R, but able to do fair quad set on L and unable to do a quad set on the R.  Recommend returning to SNF.      Follow Up Recommendations SNF;Supervision/Assistance - 24 hour    Equipment Recommendations  Hospital bed;Wheelchair (measurements PT)    Recommendations for Other Services       Precautions / Restrictions Precautions Precautions: Fall Precaution Comments: greater than 20 falls in past 3 months per chart Required Braces or Orthoses: Other Brace/Splint Other Brace/Splint: B upright braces for foot drop (has been wearing these for many years) Restrictions Weight Bearing Restrictions: No      Mobility  Bed Mobility   Bed Mobility: Rolling Rolling: Total assist;+2 for physical assistance         General bed mobility comments: Pt unable to tolerate more than a small side log roll to the L to place pillow under to change position and for skin integrity.  Pt unable to A with any movement and gritting teeth  Transfers                 General transfer comment: unable; requires mechanical lift in present condition if she could even  tolerate  Ambulation/Gait             General Gait Details: unable  Stairs            Wheelchair Mobility    Modified Rankin (Stroke Patients Only)       Balance Overall balance assessment: History of Falls                                           Pertinent Vitals/Pain Pain Assessment: 0-10 Pain Score: 4  (at rest and > 10 with mobility) Faces Pain Scale: Hurts worst (screaming,  yelling when repositioned) Pain Location: back Pain Descriptors / Indicators: Sharp Pain Intervention(s): Limited activity within patient's tolerance;Repositioned;Monitored during session    Home Living Family/patient expects to be discharged to:: Skilled nursing facility                      Prior Function Level of Independence: Needs assistance   Gait / Transfers Assistance Needed: Pt has been at rehab the past week and she states she has been able to do very little           Hand Dominance        Extremity/Trunk Assessment        Lower Extremity Assessment RLE Deficits / Details: numb to light touch below B knees, foot drop B at baseline;  R hip flexion AAROM 45* limited by pain, unable to do quad set LLE Deficits / Details: L hip flexion AAROM 20* limited by pain, pt able to perform fair quad set    Cervical / Trunk Assessment Cervical / Trunk Assessment:  (unable to assess, pt could not achieve sitting position)  Communication   Communication: Other (comment) (soft spoken, hard to understand at times)  Cognition Arousal/Alertness: Awake/alert Behavior During Therapy: Flat affect Overall Cognitive Status: No family/caregiver present to determine baseline cognitive functioning                                 General Comments: no family present, but per chart, pt is not at baseline      General Comments General comments (skin integrity, edema, etc.): Pt noticeabley tense throughout session with gritting teeth with any  movement active or passive.  When in severe pain she states "purple, I see purple."    Exercises     Assessment/Plan    PT Assessment Patient needs continued PT services  PT Problem List Decreased strength;Decreased range of motion;Decreased activity tolerance;Pain;Impaired sensation;Decreased mobility       PT Treatment Interventions Functional mobility training;Therapeutic activities;Therapeutic exercise;Patient/family education    PT Goals (Current goals can be found in the Care Plan section)  Acute Rehab PT Goals Patient Stated Goal: decrease pain PT Goal Formulation: With patient Time For Goal Achievement: 03/13/17 Potential to Achieve Goals: Fair    Frequency Min 2X/week   Barriers to discharge        Co-evaluation               AM-PAC PT "6 Clicks" Daily Activity  Outcome Measure Difficulty turning over in bed (including adjusting bedclothes, sheets and blankets)?: Total Difficulty moving from lying on back to sitting on the side of the bed? : Total Difficulty sitting down on and standing up from a chair with arms (e.g., wheelchair, bedside commode, etc,.)?: Total Help needed moving to and from a bed to chair (including a wheelchair)?: Total Help needed walking in hospital room?: Total Help needed climbing 3-5 steps with a railing? : Total 6 Click Score: 6    End of Session   Activity Tolerance: Patient limited by pain Patient left: in bed;with call bell/phone within reach Nurse Communication: Mobility status PT Visit Diagnosis: Repeated falls (R29.6);Difficulty in walking, not elsewhere classified (R26.2);Pain Pain - Right/Left: Right Pain - part of body: Hip    Time: 1208-1220 PT Time Calculation (min) (ACUTE ONLY): 12 min   Charges:   PT Evaluation $PT Eval Moderate Complexity: 1 Procedure     PT G Codes:        Theresa Flowers, [place] Pager [phone] 02/27/2017   Enzo Montgomery 02/27/2017, 1:47 PM

## 2017-02-27 NOTE — Clinical Social Work Note (Signed)
Clinical Social Work Assessment  Patient Details  Name: Theresa Flowers MRN: [id number] Date of Birth: 03-13-1946  Date of referral:  02/27/17               Reason for consult:  Facility Placement                Permission sought to share information with:  Facility Industrial/product designer granted to share information::  Yes, Verbal Permission Granted  Name::        Agency::     Relationship::     Contact Information:     Housing/Transportation Living arrangements for the past 2 months:  Skilled Nursing Facility, Single Family Home (Patient recently discharged to SNF on 02/21/17) Source of Information:  Adult Children, Spouse Patient Interpreter Needed:  None Criminal Activity/Legal Involvement Pertinent to Current Situation/Hospitalization:  No - Comment as needed Significant Relationships:  Adult Children, Spouse Lives with:  Spouse, Facility Resident Do you feel safe going back to the place where you live?  Yes Need for family participation in patient care:  Yes (Comment)  Care giving concerns:  Patient from [facility] SNF for ST rehab. PT recommending [facility] at [facility].   Social Worker assessment / plan:  CSW spoke with patient and patient's family at bedside. Patient alert and oriented to person. CSW and patient's family discussed PT recommendation for ST rehab at [facility]. Patient's family agreeable to SNF for ST rehab and [facility] Patient's husband reported that they did not hold a bed for patient. CSW agreed to refer patient back to [facility]. CSW will complete FL2 and start insurance authorization process. CSW will continue to follow and assist patient/paitent's family with discharge planning.   Employment status:  Retired Database administrator PT Recommendations:  Skilled Nursing Facility Information / Referral to community resources:  Skilled Nursing Facility  Patient/Family's Response to care:   Patient's family agreeable to ST rehab at [facility].   Patient/Family's Understanding of and Emotional Response to Diagnosis, Current Treatment, and Prognosis:  Patient's family verbalized understanding of patient's diagnosis and the plan for patient to discharge to SNF. Patient's family expressed financial concerns about patient's hospital stay, CSW validated concerns.   Emotional Assessment Appearance:  Appears stated age Attitude/Demeanor/Rapport:    Affect (typically observed):  Happy Orientation:  Oriented to Self Alcohol / Substance use:  Not Applicable Psych involvement (Current and /or in the community):  No (Comment)  Discharge Needs  Concerns to be addressed:  No discharge needs identified Readmission within the last 30 days:  Yes Current discharge risk:  None Barriers to Discharge:  No Barriers Identified   Antionette Poles, LCSW 02/27/2017, 4:14 PM

## 2017-02-27 NOTE — Patient Outreach (Signed)
[facility]) Care Management  02/27/2017  Theresa Flowers 23-Nov-1945 [id number]   This [facility] CSW (while covering for Danford Bad, LCSW), received referral for outreach and follow up at [employer]. CSW attempted 1st outreach to patient today. HIPPA compliant voicemail message left. CSW noted patient was admitted back to hospital on 02/26/17 and is currently being treated for encephalopathy. CSW will follow along and plan for [facility] CSW follow up after hospital discharge.    Reece Levy, MSW, LCSW Clinical Social Worker  [facility] [employer] [phone]

## 2017-02-27 NOTE — Progress Notes (Signed)
Initial Nutrition Assessment  INTERVENTION:   Provide Boost Breeze po TID, each supplement provides 250 kcal and 9 grams of protein  RD to continue to monitor  NUTRITION DIAGNOSIS:   Inadequate oral intake related to  (AMS) as evidenced by meal completion < 25%.  GOAL:   Patient will meet greater than or equal to 90% of their needs  MONITOR:   PO intake, Supplement acceptance, Labs, Weight trends, Skin, I & O's  REASON FOR ASSESSMENT:   Consult Assessment of nutrition requirement/status  ASSESSMENT:   71 y.o. Female with significant hx of neuropathy, HTN, and multilevel degenerative disc disease of the lumbar spine presented to ED after she was found unresponisve at [facility]. EMS administered 2 mg of narcan at which point the patient became alert. SNF states she hasn't been eating well. Her dilaudid was decreased to 2 mg every 4h in the SNF because of confusion. Patient has had tremors prior to entering hospital. Patient has a hard time coming up with the words she wants to say. Patient also had trouble holding a phone in her right her this afternoon. Her neuropathy has an unclear etiology.  Patient with poor appetite and AMS. Pt unable to provide much history at this time. Since discharging on 6/20, pt has not been eating that well at SNF. During previous admission ,pt was consuming 75% of meals.  Will order Boost Breeze for patient.  Per chart review, pt has gained weight since 6/19. Nutrition focused physical exam shows no sign of depletion of muscle mass or body fat.  Medications: IV Thiamine daily Labs reviewed: Low Na Elevated Phos Mg WNL  Diet Order:  Diet Heart Room service appropriate? Yes; Fluid consistency: Thin  Skin:  Wound (see comment) (stage II pressure injury: coccyx)  Last BM:  PTA  Height:   Ht Readings from Last 1 Encounters:  02/27/17 5\' 9"  (1.753 m)    Weight:   Wt Readings from Last 1 Encounters:  02/27/17 193 lb 5.5 oz (87.7 kg)    Ideal  Body Weight:  65.9 kg  BMI:  Body mass index is 28.55 kg/m.  Estimated Nutritional Needs:   Kcal:  1650-1850  Protein:  80-90g  Fluid:  1.8L/day  EDUCATION NEEDS:   No education needs identified at this time  03/01/17, MS, RD, LDN Pager: [phone] After Hours Pager: [phone]

## 2017-02-27 NOTE — Evaluation (Signed)
Clinical/Bedside Swallow Evaluation Patient Details  Name: Theresa Flowers MRN: [id number] Date of Birth: October 01, 1945  Today's Date: 02/27/2017 Time: SLP Start Time (ACUTE ONLY): 1150 SLP Stop Time (ACUTE ONLY): 1204 SLP Time Calculation (min) (ACUTE ONLY): 14 min  Past Medical History:  Past Medical History:  Diagnosis Date  . Hypertension    Past Surgical History:  Past Surgical History:  Procedure Laterality Date  . ABDOMINAL HYSTERECTOMY    . ADENOIDECTOMY    . APPENDECTOMY    . TONSILLECTOMY     HPI:  71 y.o. femalewith medical history significant of chronic back pain, neuropathy, hypertension, multilevel degenerative disc disease of the lumbar spine Admitted for Acute encephalopathy possibly secondary to  dehydration versus polypharmacy complicated by acute renal failure. CXR and head CT negative.    Assessment / Plan / Recommendation Clinical Impression  Patient presents with what appears to be a normal oropharyngeal swallow with no overt indication of aspiration. At this time AMS and inability to be positioned at 90 degrees for optimal safety with po intake are greatest risk factors for aspiration. For todays exam, patient placed in trendelenburg and then head of bed lifted to approximately 30 degrees to maximize safety. No further SLP needs indicated at this time.  SLP Visit Diagnosis: Dysphagia, unspecified (R13.10)    Aspiration Risk  Mild aspiration risk    Diet Recommendation Regular;Thin liquid   Liquid Administration via: Cup;Straw Medication Administration: Whole meds with liquid Supervision: Staff to assist with self feeding Compensations: Slow rate;Small sips/bites Postural Changes: Other (Comment) (see impression statement)    Other  Recommendations Oral Care Recommendations: Oral care BID   Follow up Recommendations None        Swallow Study   General HPI: 71 y.o. femalewith medical history significant of chronic back pain, neuropathy,  hypertension, multilevel degenerative disc disease of the lumbar spine Admitted for Acute encephalopathy possibly secondary to  dehydration versus polypharmacy complicated by acute renal failure. CXR and head CT negative.  Type of Study: Bedside Swallow Evaluation Previous Swallow Assessment: none Diet Prior to this Study: Regular;Thin liquids Temperature Spikes Noted: No Respiratory Status: Room air History of Recent Intubation: No Behavior/Cognition: Alert;Requires cueing Oral Cavity Assessment: Within Functional Limits Oral Care Completed by SLP: Recent completion by staff Oral Cavity - Dentition: Adequate natural dentition Vision: Functional for self-feeding Self-Feeding Abilities: Needs assist Patient Positioning: Other (comment);Postural control interferes with function (see impression statement) Baseline Vocal Quality: Normal Volitional Cough: Strong Volitional Swallow: Able to elicit    Oral/Motor/Sensory Function Overall Oral Motor/Sensory Function: Within functional limits   Ice Chips Ice chips: Not tested   Thin Liquid Thin Liquid: Within functional limits Presentation: Straw    Nectar Thick Nectar Thick Liquid: Not tested   Honey Thick Honey Thick Liquid: Not tested   Puree Puree: Within functional limits Presentation: Spoon   Solid   Theresa Venier MA, CCC-SLP [phone]    Solid: Within functional limits        Theresa Flowers 02/27/2017,12:13 PM

## 2017-02-28 LAB — BASIC METABOLIC PANEL
ANION GAP: 10 (ref 5–15)
BUN: 51 mg/dL — ABNORMAL HIGH (ref 6–20)
CO2: 25 mmol/L (ref 22–32)
Calcium: 9.3 mg/dL (ref 8.9–10.3)
Chloride: 103 mmol/L (ref 101–111)
Creatinine, Ser: 0.93 mg/dL (ref 0.44–1.00)
GLUCOSE: 173 mg/dL — AB (ref 65–99)
POTASSIUM: 3.9 mmol/L (ref 3.5–5.1)
Sodium: 138 mmol/L (ref 135–145)

## 2017-02-28 LAB — CARBAMAZEPINE, FREE AND TOTAL
CARBAMAZEPINE, TOTAL: 7.4 ug/mL (ref 4.0–12.0)
Carbamazepine, Free: 2.5 ug/mL (ref 0.6–4.2)

## 2017-02-28 LAB — MAGNESIUM: Magnesium: 2 mg/dL (ref 1.7–2.4)

## 2017-02-28 MED ORDER — HYDROMORPHONE HCL 1 MG/ML IJ SOLN
0.5000 mg | INTRAMUSCULAR | Status: DC | PRN
  Administered 2017-02-28 – 2017-03-01 (×5): 0.5 mg via INTRAVENOUS
  Filled 2017-02-28 (×10): qty 0.5

## 2017-02-28 MED ORDER — GABAPENTIN 400 MG PO CAPS
400.0000 mg | ORAL_CAPSULE | Freq: Three times a day (TID) | ORAL | Status: DC
  Administered 2017-02-28 – 2017-03-02 (×7): 400 mg via ORAL
  Filled 2017-02-28 (×7): qty 1

## 2017-02-28 MED ORDER — VITAMIN B-1 100 MG PO TABS
100.0000 mg | ORAL_TABLET | Freq: Every day | ORAL | Status: DC
  Administered 2017-03-01 – 2017-03-02 (×2): 100 mg via ORAL
  Filled 2017-02-28 (×2): qty 1

## 2017-02-28 MED ORDER — LORAZEPAM 0.5 MG PO TABS
0.5000 mg | ORAL_TABLET | Freq: Four times a day (QID) | ORAL | Status: DC | PRN
  Administered 2017-02-28 – 2017-03-02 (×4): 0.5 mg via ORAL
  Filled 2017-02-28 (×4): qty 1

## 2017-02-28 MED ORDER — AMLODIPINE BESYLATE 5 MG PO TABS
5.0000 mg | ORAL_TABLET | Freq: Every day | ORAL | Status: DC
Start: 2017-02-28 — End: 2017-03-02
  Administered 2017-02-28 – 2017-03-02 (×3): 5 mg via ORAL
  Filled 2017-02-28 (×3): qty 1

## 2017-02-28 NOTE — Progress Notes (Signed)
Offered pt lunch but let pt know that she would have to raise HOB d/t aspiration precaution. Pt refuses to raise HOB due to pain. Offered PRN pain medication. Pt refuses all pain medication. Called pt husband. Pt husband will be in at around 1430 to talk to pt about eating. Pt calling out when staff leaves the room. Staff checking on pt [phone]. Pt stating " I can see the tiles on the ceiling changing, im not stupid you know". A&O to self. MD was updating this am on mental status. Pt continues to call out when staff not in room. MD updated. New orders carried out

## 2017-02-28 NOTE — Progress Notes (Signed)
PROGRESS NOTE [facility]   PROSPERITY DARROUGH   HFW:[id number] DOB: 06-09-1946  DOA: 02/26/2017 PCP: Andreas Blower., MD   Brief Narrative:  71 y.o. Female with significant hx of neuropathy of unknown etiology, htn, and multilevel degenerative disc disease presented to the ED after being found unresponsive at SNF. Patient awoke after receiving 2 mg of narcan. Patient had prior tremors and word finding. Patient seems much improved today.   Assessment & Plan:   Active Problems:   Neuropathy   Chronic back pain   Essential hypertension   Acute renal failure (HCC)   Hyponatremia   Dehydration   Anemia   Acute encephalopathy   ARF (acute renal failure) (HCC)   Acute kidney injury (HCC)  Acute Renal Failure -secondary to polypharmacy and dehydration -Hold ACEI -foley placed yesterday due to urinary retention  Hyponatremia -secondary to dehydration -IVF fluids -monitor  Essential HTN -holding lisinopril due to AKI -monitor  Dehydration -IVF  Acute encephalopathy -most likely at baseline -IVF -MRI unchanged   DVT prophylaxis: heparin Code Status: FULL Family Communication: none at bedside Disposition Plan: SNF in 24-48 hours   Consultants:   none  Procedures:   Bladder scan  Antimicrobials:  None  Subjective: Pt seen and examined. Patient states she is doing better today, but still has some mild confusion. Patient denies N/V/D, chest pain, or SOB. Admits some intermittent confusion and trouble finding words.   Objective: Vitals:   02/27/17 1623 02/27/17 2055 02/28/17 0211 02/28/17 0638  BP: 130/70 (!) 167/72 (!) 156/80 (!) 175/77  Pulse: 80 87 86 86  Resp: (!) 170 18 18 18   Temp: 98.1 F (36.7 C) 98.5 F (36.9 C) 98.9 F (37.2 C) 98.6 F (37 C)  TempSrc: Oral Oral Oral Oral  SpO2: 95% 93% 94% 93%  Weight:      Height:        Intake/Output Summary (Last 24 hours) at 02/28/17 1103 Last data filed at 02/28/17 03/02/17  Gross per 24 hour   Intake          2158.33 ml  Output             1975 ml  Net           183.33 ml   Filed Weights   02/27/17 0159  Weight: 193 lb 5.5 oz (87.7 kg)    Examination:  General exam: Appears calm and in no acute distress Respiratory system: Clear to auscultation. No wheezes,crackle or rhonchi Cardiovascular system: S1 & S2 heard, RRR. No JVD, murmurs, rubs or gallops Gastrointestinal system: Abdomen is nondistended, soft and nontender. No organomegaly or masses felt. Normal bowel sounds heard. Central nervous system: mildly confused Skin: No rashes, lesions or ulcers Psychiatry: Judgement and insight appear normal. Mood & affect appropriate.    Data Reviewed: I have personally reviewed following labs and imaging studies  CBC:  Recent Labs Lab 02/21/17 1158 02/26/17 2053 02/27/17 0459  WBC 9.5 10.3 9.5  NEUTROABS  --  8.8*  --   HGB 11.5* 9.7* 10.1*  HCT 33.6* 28.0* 29.0*  MCV 85.3 85.1 85.0  PLT 238 283 273   Basic Metabolic Panel:  Recent Labs Lab 02/21/17 1158 02/26/17 2053 02/27/17 0459 02/28/17 0448  NA 137 129* 133*  --   K 3.5 4.8 4.4  --   CL 100* 93* 97*  --   CO2 28 24 24   --   GLUCOSE 139* 128* 107*  --   BUN 27* 115* 108*  --  CREATININE 0.86 3.46* 2.79*  --   CALCIUM 9.3 8.7* 8.8*  --   MG  --   --  2.4 2.0  PHOS  --   --  6.1*  --    GFR: Estimated Creatinine Clearance: 21.8 mL/min (A) (by C-G formula based on SCr of 2.79 mg/dL (H)). Liver Function Tests:  Recent Labs Lab 02/26/17 2053 02/27/17 0459  AST 60* 52*  ALT 50 50  ALKPHOS 86 90  BILITOT 0.4 0.5  PROT 6.6 6.7  ALBUMIN 2.8* 2.8*   No results for input(s): LIPASE, AMYLASE in the last 168 hours.  Recent Labs Lab 02/26/17 2227 02/27/17 0459  AMMONIA 56* 11   Coagulation Profile: No results for input(s): INR, PROTIME in the last 168 hours. Cardiac Enzymes: No results for input(s): CKTOTAL, CKMB, CKMBINDEX, TROPONINI in the last 168 hours. BNP (last 3 results) No results  for input(s): PROBNP in the last 8760 hours. HbA1C: No results for input(s): HGBA1C in the last 72 hours. CBG:  Recent Labs Lab 02/26/17 1928  GLUCAP 133*   Lipid Profile: No results for input(s): CHOL, HDL, LDLCALC, TRIG, CHOLHDL, LDLDIRECT in the last 72 hours. Thyroid Function Tests:  Recent Labs  02/27/17 0459  TSH 0.994   Anemia Panel: No results for input(s): VITAMINB12, FOLATE, FERRITIN, TIBC, IRON, RETICCTPCT in the last 72 hours. Sepsis Labs: No results for input(s): PROCALCITON, LATICACIDVEN in the last 168 hours.  No results found for this or any previous visit (from the past 240 hour(s)).       Radiology Studies: Dg Chest 1 View  Result Date: 02/26/2017 CLINICAL DATA:  Altered mental status EXAM: CHEST 1 VIEW COMPARISON:  None. FINDINGS: Heart is upper limits normal in size. Lungs are clear. No effusions or acute bony abnormality. IMPRESSION: No active disease. Electronically Signed   By: Charlett Nose M.D.   On: 02/26/2017 20:44   Ct Head Wo Contrast  Result Date: 02/26/2017 CLINICAL DATA:  Altered mental status EXAM: CT HEAD WITHOUT CONTRAST TECHNIQUE: Contiguous axial images were obtained from the base of the skull through the vertex without intravenous contrast. COMPARISON:  None. FINDINGS: Brain: No acute territorial infarction, hemorrhage or intracranial mass is seen. Mild to moderate periventricular and subcortical white matter hypodensity. Mild to moderate atrophy. Ventricles are nonenlarged. Vascular: No hyperdense vessels.  Carotid artery calcifications. Skull: No fracture or suspicious bone lesion. Sinuses/Orbits: No acute finding. Other: None IMPRESSION: 1. No CT evidence for acute intracranial abnormality. 2. Moderate periventricular and subcortical small vessel ischemic changes of the white matter. Electronically Signed   By: Jasmine Pang M.D.   On: 02/26/2017 20:35   Mr Brain Wo Contrast  Result Date: 02/27/2017 CLINICAL DATA:  Initial evaluation  for acute encephalopathy, weakness. EXAM: MRI HEAD WITHOUT CONTRAST MRI CERVICAL SPINE WITHOUT CONTRAST TECHNIQUE: Multiplanar, multiecho pulse sequences of the brain and surrounding structures, and cervical spine, to include the craniocervical junction and cervicothoracic junction, were obtained without intravenous contrast. COMPARISON:  Prior CT from 02/26/2017 as well as previous MRI of the cervical spine from 05/01/2011. FINDINGS: MRI HEAD FINDINGS Brain: Study degraded by motion artifact. Mild diffuse prominence of the CSF containing spaces compatible with generalized age-related cerebral atrophy. Patchy and confluent T2/FLAIR hyperintensity within the periventricular and deep white matter both cerebral hemispheres most consistent with chronic small vessel ischemic disease, mild to moderate nature. No abnormal foci of restricted diffusion to suggest acute or subacute ischemia. Gray-white matter differentiation maintained. No encephalomalacia to suggest chronic infarction. No evidence  for acute or chronic intracranial hemorrhage. No mass lesion, midline shift or mass effect. No hydrocephalus. No extra-axial fluid collection. Major dural sinuses are grossly patent. Pituitary gland suprasellar region within normal limits. Midline structures intact and normal. Vascular: Major intracranial vascular flow voids are maintained. Skull and upper cervical spine: Craniocervical junction within normal limits. Bone marrow signal intensity normal. No scalp soft tissue abnormality. Sinuses/Orbits: Visualized globes and orbital soft tissues within normal limits. Paranasal sinuses are clear. Trace opacity within the bilateral mastoid air cells, of doubtful significance. MRI CERVICAL SPINE FINDINGS Alignment: Study severely limited by a motion artifact. 4 mm anterolisthesis of C4 on C5, with trace retrolisthesis of C5 on C6. Straightening of the normal cervical lordosis. Alignment is stable from previous. Vertebrae: Vertebral  body heights maintained. No evidence for acute or chronic fracture. Benign hemangioma noted within the C7 vertebral body. No worrisome osseous lesions. Mild reactive endplate changes present about the posterior C5-6 interspace. Cord: Evaluation of the cervical spinal cord limited on this motion degraded exam. No obvious cord signal abnormality. Posterior Fossa, vertebral arteries, paraspinal tissues: Craniocervical junction normal. There is apparent edema within knee right greater the left posterior paraspinous musculature (series 3, image 2, 12), of uncertain etiology or significance. This extends from the skullbase/suboccipital region through the T1-2 level on the right, and to approximately C5-6 on the left. No abnormal prevertebral edema. No other obvious abnormality within knee paraspinous soft tissues on this motion degraded exam. Disc levels: C2-C3: Mild disc bulge. Moderate facet hypertrophy. No significant spinal stenosis. Severe right with mild left C3 foraminal stenosis grossly stable. C3-C4: Degenerative disc bulge with probable small central disc protrusion, grossly similar to previous. Moderate bilateral facet hypertrophy, greater on the left. Grossly stable mild spinal stenosis. Moderate to severe bilateral C4 foraminal stenosis. C4-C5: Moderate degenerative circumferential disc osteophyte complex. Severe left with moderate 5-6 mm in AP diameter, grossly similar to previous. Severe bilateral C5 foraminal stenosis. C5-C6: Chronic circumferential disc osteophyte complex. Severe facet hypertrophy. Resultant severe canal stenosis with flattening of the cervical spinal cord. No obvious cord signal changes. Thecal sac measures approximately 5-6 mm in AP diameter. Severe bilateral C6 foraminal stenosis. C6-C7: Left eccentric disc osteophyte complex. Moderate facet hypertrophy. Moderate spinal stenosis. Severe left with moderate right C7 foraminal narrowing, grossly similar. C7-T1: Degenerative disc bulge.  Facet hypertrophy. No significant canal stenosis. Grossly similar moderate bilateral C8 foraminal narrowing. Visualized upper thoracic spine demonstrates degenerative disc bulging at T2-3, T3-4, T4-5 without significant stenosis. IMPRESSION: MRI HEAD IMPRESSION: 1. No acute intracranial process identified. 2. Generalized age-related cerebral atrophy with mild to moderate chronic small vessel ischemic disease. MRI CERVICAL SPINE IMPRESSION: 1. Severely limited study due to motion artifact. 2. Edema within the posterior paraspinous musculature of the neck, right slightly greater than left. Finding is nonspecific, and of uncertain clinical significance. Possible muscular injury/strain could have this appearance. Infection/myositis could be considered, although there is no other findings to suggest acute infection within the underlying cervical spine itself. Correlation with symptomatology, history, and laboratory values recommended. 3. Severe multilevel degenerative spondylolysis extending from C3-4 through C6-7, grossly similar relative to prior MRI from 05/01/2011. Moderate to severe canal stenosis at these levels, greatest at C4-5 and C5-6. No obvious cord signal abnormality. Multifactorial severe neural foraminal narrowing on the right at C3, bilateral C5, bilateral C6, and on the left at C7. Electronically Signed   By: Rise Mu M.D.   On: 02/27/2017 22:46   Mr Cervical Spine Wo Contrast  Result  Date: 02/27/2017 CLINICAL DATA:  Initial evaluation for acute encephalopathy, weakness. EXAM: MRI HEAD WITHOUT CONTRAST MRI CERVICAL SPINE WITHOUT CONTRAST TECHNIQUE: Multiplanar, multiecho pulse sequences of the brain and surrounding structures, and cervical spine, to include the craniocervical junction and cervicothoracic junction, were obtained without intravenous contrast. COMPARISON:  Prior CT from 02/26/2017 as well as previous MRI of the cervical spine from 05/01/2011. FINDINGS: MRI HEAD FINDINGS  Brain: Study degraded by motion artifact. Mild diffuse prominence of the CSF containing spaces compatible with generalized age-related cerebral atrophy. Patchy and confluent T2/FLAIR hyperintensity within the periventricular and deep white matter both cerebral hemispheres most consistent with chronic small vessel ischemic disease, mild to moderate nature. No abnormal foci of restricted diffusion to suggest acute or subacute ischemia. Gray-white matter differentiation maintained. No encephalomalacia to suggest chronic infarction. No evidence for acute or chronic intracranial hemorrhage. No mass lesion, midline shift or mass effect. No hydrocephalus. No extra-axial fluid collection. Major dural sinuses are grossly patent. Pituitary gland suprasellar region within normal limits. Midline structures intact and normal. Vascular: Major intracranial vascular flow voids are maintained. Skull and upper cervical spine: Craniocervical junction within normal limits. Bone marrow signal intensity normal. No scalp soft tissue abnormality. Sinuses/Orbits: Visualized globes and orbital soft tissues within normal limits. Paranasal sinuses are clear. Trace opacity within the bilateral mastoid air cells, of doubtful significance. MRI CERVICAL SPINE FINDINGS Alignment: Study severely limited by a motion artifact. 4 mm anterolisthesis of C4 on C5, with trace retrolisthesis of C5 on C6. Straightening of the normal cervical lordosis. Alignment is stable from previous. Vertebrae: Vertebral body heights maintained. No evidence for acute or chronic fracture. Benign hemangioma noted within the C7 vertebral body. No worrisome osseous lesions. Mild reactive endplate changes present about the posterior C5-6 interspace. Cord: Evaluation of the cervical spinal cord limited on this motion degraded exam. No obvious cord signal abnormality. Posterior Fossa, vertebral arteries, paraspinal tissues: Craniocervical junction normal. There is apparent edema  within knee right greater the left posterior paraspinous musculature (series 3, image 2, 12), of uncertain etiology or significance. This extends from the skullbase/suboccipital region through the T1-2 level on the right, and to approximately C5-6 on the left. No abnormal prevertebral edema. No other obvious abnormality within knee paraspinous soft tissues on this motion degraded exam. Disc levels: C2-C3: Mild disc bulge. Moderate facet hypertrophy. No significant spinal stenosis. Severe right with mild left C3 foraminal stenosis grossly stable. C3-C4: Degenerative disc bulge with probable small central disc protrusion, grossly similar to previous. Moderate bilateral facet hypertrophy, greater on the left. Grossly stable mild spinal stenosis. Moderate to severe bilateral C4 foraminal stenosis. C4-C5: Moderate degenerative circumferential disc osteophyte complex. Severe left with moderate 5-6 mm in AP diameter, grossly similar to previous. Severe bilateral C5 foraminal stenosis. C5-C6: Chronic circumferential disc osteophyte complex. Severe facet hypertrophy. Resultant severe canal stenosis with flattening of the cervical spinal cord. No obvious cord signal changes. Thecal sac measures approximately 5-6 mm in AP diameter. Severe bilateral C6 foraminal stenosis. C6-C7: Left eccentric disc osteophyte complex. Moderate facet hypertrophy. Moderate spinal stenosis. Severe left with moderate right C7 foraminal narrowing, grossly similar. C7-T1: Degenerative disc bulge. Facet hypertrophy. No significant canal stenosis. Grossly similar moderate bilateral C8 foraminal narrowing. Visualized upper thoracic spine demonstrates degenerative disc bulging at T2-3, T3-4, T4-5 without significant stenosis. IMPRESSION: MRI HEAD IMPRESSION: 1. No acute intracranial process identified. 2. Generalized age-related cerebral atrophy with mild to moderate chronic small vessel ischemic disease. MRI CERVICAL SPINE IMPRESSION: 1. Severely  limited study  due to motion artifact. 2. Edema within the posterior paraspinous musculature of the neck, right slightly greater than left. Finding is nonspecific, and of uncertain clinical significance. Possible muscular injury/strain could have this appearance. Infection/myositis could be considered, although there is no other findings to suggest acute infection within the underlying cervical spine itself. Correlation with symptomatology, history, and laboratory values recommended. 3. Severe multilevel degenerative spondylolysis extending from C3-4 through C6-7, grossly similar relative to prior MRI from 05/01/2011. Moderate to severe canal stenosis at these levels, greatest at C4-5 and C5-6. No obvious cord signal abnormality. Multifactorial severe neural foraminal narrowing on the right at C3, bilateral C5, bilateral C6, and on the left at C7. Electronically Signed   By: Rise Mu M.D.   On: 02/27/2017 22:46   US Renal  Result Date: 02/27/2017 CLINICAL DATA:  Renal failure EXAM: RENAL / URINARY TRACT ULTRASOUND COMPLETE COMPARISON:  MRI 01/17/2011, CT 02/19/2017. FINDINGS: Right Kidney: Length: 9.9 cm. Increased echogenicity consistent with medical renal disease. No hydronephrosis. No perinephric collection. No focal renal parenchymal lesion. Left Kidney: Length: 11.0 cm. Increased echogenicity consistent with medical renal disease. Complex 4 cm cyst, probably unchanged from 01/17/2011 MRI. No hydronephrosis. Bladder: Appears normal for degree of bladder distention. IMPRESSION: Echogenic renal parenchyma consistent with medical renal disease. No hydronephrosis. Complex upper pole left renal cyst is probably unchanged from 2012 although not conclusively characterized. Dedicated renal MRI may be considered for optimal characterization. Electronically Signed   By: Ellery Plunk M.D.   On: 02/27/2017 00:52      Scheduled Meds: . carbamazepine  200 mg Oral TID  . DULoxetine  60 mg Oral BID    . famotidine  10 mg Oral Daily  . feeding supplement  1 Container Oral TID BM  . gabapentin  300 mg Oral TID  . heparin  5,000 Units Subcutaneous Q8H  . lactulose  10 g Oral BID  . methocarbamol  750 mg Oral TID  . polyethylene glycol  17 g Oral Daily  . sodium chloride flush  3 mL Intravenous Q12H  . [START ON 03/01/2017] thiamine  100 mg Oral Daily   Continuous Infusions:   LOS: 1 day     Sheyna Pettibone, PA-s  If 7PM-7AM, please contact night-coverage [email or web address] Password [facility] 02/28/2017, 11:03 AM

## 2017-02-28 NOTE — Progress Notes (Signed)
MD spoke with family. MD with new orders to DC foley and trial void. New order bladder scan Q8H. New order: update MD for retention greater that . Orders carried out. Foley removed at 1800.

## 2017-02-28 NOTE — Progress Notes (Signed)
CSW started insurance authorization with [employer]. Staff member Clydie Braun agreed to follow up with an update. CSW will continue to follow patient and assist with discharge planning when medically stable.   Celso Sickle, [place] Clinical Social Worker [facility] Cell#: [phone]

## 2017-02-28 NOTE — Progress Notes (Signed)
PHARMACIST - PHYSICIAN COMMUNICATION CONCERNING: IV to Oral Route Change Policy  RECOMMENDATION: This patient is receiving thiamine by the intravenous route.  Based on criteria approved by the Pharmacy and Therapeutics Committee, the intravenous medication(s) is/are being converted to the equivalent oral dose form(s).   DESCRIPTION: These criteria include:  The patient is eating (either orally or via tube) and/or has been taking other orally administered medications for a least 24 hours  The patient has no evidence of active gastrointestinal bleeding or impaired GI absorption (gastrectomy, short bowel, patient on TNA or NPO).  If you have questions about this conversion, please contact the Pharmacy Department  []   [phone] )  ( [phone] []   [phone] )  [facility] []   [phone] )  [facility] []   [phone] )  [facility] [x]   [phone] )  [facility]   ( [phone], [facility] 02/28/2017 10:53 AM

## 2017-02-28 NOTE — [place] FL2 (Signed)
[place] MEDICAID FL2 LEVEL OF CARE SCREENING TOOL     IDENTIFICATION  Patient Name: Theresa Flowers Birthdate: 1945-09-30 Sex: female Admission Date (Current Location): 02/26/2017  [facility] and [place] Number:  Producer, television/film/video and Address:  [facility],  [address] [place], [place] [address]      Provider Number: [id number]  Attending Physician Name and Address:  Maxie Barb, MD  Relative Name and Phone Number:  [name] Spouse - [phone]    Current Level of Care: Hospital Recommended Level of Care: Skilled Nursing Facility Prior Approval Number:    Date Approved/Denied:   PASRR Number: [id number]  Discharge Plan: SNF    Current Diagnoses: Patient Active Problem List   Diagnosis Date Noted  . Acute encephalopathy 02/27/2017  . ARF (acute renal failure) (HCC) 02/27/2017  . Acute kidney injury (HCC)   . Acute renal failure (HCC) 02/26/2017  . Hyponatremia 02/26/2017  . Dehydration 02/26/2017  . Anemia 02/26/2017  . Low back pain radiating down leg 02/20/2017  . Neuropathy 02/20/2017  . Chronic back pain 02/20/2017  . Essential hypertension 02/20/2017    Orientation RESPIRATION BLADDER Height & Weight     Self  Normal Indwelling catheter Weight: 193 lb 5.5 oz (87.7 kg) Height:  5\' 9"  (175.3 cm)  BEHAVIORAL SYMPTOMS/MOOD NEUROLOGICAL BOWEL NUTRITION STATUS        Diet (Heart)  AMBULATORY STATUS COMMUNICATION OF NEEDS Skin   Total Care Verbally Other (Comment) (PressureInjury06/20/18StageII-Partialthicknesslossofdermispresentingasashallowopenulcerwithared,pinkwoundbedwithoutslough.healingpressureulcerwithpartialdermalthicknessloss Location: Coccyx Location Orientation: Mid )                       Personal Care Assistance Level of Assistance  Bathing, Dressing, Feeding Bathing Assistance: Limited assistance Feeding assistance: Limited assistance Dressing Assistance: Limited assistance      Functional Limitations Info             SPECIAL CARE FACTORS FREQUENCY  PT (By licensed PT), OT (By licensed OT)     PT Frequency: 5x/week OT Frequency: 5x/week            Contractures Contractures Info: Not present    Additional Factors Info  Code Status, Allergies Code Status Info: Full Code Allergies Info: NKA           Current Medications (02/28/2017):  This is the current hospital active medication list Current Facility-Administered Medications  Medication Dose Route Frequency Provider Last Rate Last Dose  . acetaminophen (TYLENOL) tablet 650 mg  650 mg Oral Q6H PRN 03/02/2017, MD   650 mg at 02/27/17 1544   Or  . acetaminophen (TYLENOL) suppository 650 mg  650 mg Rectal Q6H PRN Doutova, Anastassia, MD      . amLODipine (NORVASC) tablet 5 mg  5 mg Oral Daily 03/01/17, MD      . carbamazepine (TEGRETOL) tablet 200 mg  200 mg Oral TID Maxie Barb, MD   200 mg at 02/28/17 1511  . DULoxetine (CYMBALTA) DR capsule 60 mg  60 mg Oral BID 03/02/17, MD   60 mg at 02/28/17 0844  . famotidine (PEPCID) tablet 10 mg  10 mg Oral Daily Doutova, Anastassia, MD   10 mg at 02/28/17 0842  . feeding supplement (BOOST / RESOURCE BREEZE) liquid 1 Container  1 Container Oral TID BM 03/02/17, MD   1 Container at 02/28/17 1000  . gabapentin (NEURONTIN) capsule 400 mg  400 mg Oral TID 03/02/17, MD   400 mg at  02/28/17 1511  . heparin injection 5,000 Units  5,000 Units Subcutaneous Q8H Maxie Barb, MD   5,000 Units at 02/28/17 1512  . HYDROmorphone (DILAUDID) injection 0.5 mg  0.5 mg Intravenous Q3H PRN Maxie Barb, MD   0.5 mg at 02/28/17 1512  . lactulose (CHRONULAC) 10 GM/15ML solution 10 g  10 g Oral BID Therisa Doyne, MD   10 g at 02/28/17 0843  . LORazepam (ATIVAN) tablet 0.5 mg  0.5 mg Oral Q6H PRN Maxie Barb, MD   0.5 mg at 02/28/17 1512  . methocarbamol (ROBAXIN) tablet 750 mg   750 mg Oral TID Maxie Barb, MD   750 mg at 02/28/17 1510  . ondansetron (ZOFRAN) tablet 4 mg  4 mg Oral Q6H PRN Doutova, Anastassia, MD       Or  . ondansetron (ZOFRAN) injection 4 mg  4 mg Intravenous Q6H PRN Doutova, Anastassia, MD   4 mg at 02/28/17 1523  . polyethylene glycol (MIRALAX / GLYCOLAX) packet 17 g  17 g Oral Daily Maxie Barb, MD   17 g at 02/28/17 0841  . sodium chloride flush (NS) 0.9 % injection 3 mL  3 mL Intravenous Q12H Doutova, Anastassia, MD   3 mL at 02/28/17 1000  . [START ON 03/01/2017] thiamine (VITAMIN B-1) tablet 100 mg  100 mg Oral Daily Theresa Flowers, [facility]         Discharge Medications: Please see discharge summary for a list of discharge medications.  Relevant Imaging Results:  Relevant Lab Results:   Additional Information [id number]  Antionette Poles, LCSW

## 2017-02-28 NOTE — Consult Note (Signed)
Consultation Note Date: 02/28/2017   Patient Name: Theresa Flowers  DOB: [birth year]  MRN: [id number]  Age / Sex: 71 y.o., female  PCP: Andreas Blower., MD Referring Physician: Maxie Barb, MD  Reason for Consultation: Pain control  HPI/Patient Profile: 71 y.o. female  with past medical history of htn, and multilevel degenerative disc disease admitted on 02/26/2017   Clinical Assessment and Goals of Care:  71 yo lady from SNF, with history of DJD, neuropathy, chronic back pain admitted from SNF with decreased responsiveness, required Narcan, acute renal failure for which her ACE inhibitor was held, and she was given IVF.   A palliative consult has been placed for pain management recommendations.   Ms Groller is resting in bed, her bedside RN is in the room, she has just received a prn IV Dilaudid. I introduced myself and palliative care as follows: Palliative medicine is specialized medical care for people living with serious illness. It focuses on providing relief from the symptoms and stress of a serious illness. The goal is to improve quality of life for both the patient and the family.  Patient is on Dilaudid PO, as well as other adjuvants- tegretol, zanaflex, neurontin and cymbalta at home. She was recently in a SNF. She complains of chronic pain in her back, this morning it is essentially at her baseline.  See recommendations below, thank you for the consult.   NEXT OF KIN  husband.   SUMMARY OF RECOMMENDATIONS    Agree with current pain management recommendations and current dose of Neurontin Consider opioid rotation from Dilaudid to Fentanyl if patient has worsening renal function again. For now, ok to continue with the Dilaudid, monitor renal function. Recommend patient get established with pain management services as outpatient, no further palliative recommendations.   Code  Status/Advance Care Planning:  Full code    Symptom Management:      Palliative Prophylaxis:   Bowel Regimen   Psycho-social/Spiritual:   Desire for further Chaplaincy support:no  Additional Recommendations: Caregiving  Support/Resources  Prognosis:   Unable to determine  Discharge Planning: SNF      Primary Diagnoses: Present on Admission: . Acute renal failure (HCC) . Hyponatremia . Essential hypertension . Dehydration . Anemia . Chronic back pain . Acute encephalopathy . ARF (acute renal failure) (HCC)   I have reviewed the medical record, interviewed the patient and family, and examined the patient. The following aspects are pertinent.  Past Medical History:  Diagnosis Date  . Hypertension    Social History   Social History  . Marital status: Married    Spouse name: N/A  . Number of children: N/A  . Years of education: N/A   Social History Main Topics  . Smoking status: Never Smoker  . Smokeless tobacco: Never Used  . Alcohol use No  . Drug use: No  . Sexual activity: Not Asked   Other Topics Concern  . None   Social History Narrative  . None   Family History  Problem Relation Age of  Onset  . Breast cancer Mother    Scheduled Meds: . carbamazepine  200 mg Oral TID  . DULoxetine  60 mg Oral BID  . famotidine  10 mg Oral Daily  . feeding supplement  1 Container Oral TID BM  . gabapentin  300 mg Oral TID  . heparin  5,000 Units Subcutaneous Q8H  . lactulose  10 g Oral BID  . methocarbamol  750 mg Oral TID  . polyethylene glycol  17 g Oral Daily  . sodium chloride flush  3 mL Intravenous Q12H  . [START ON 03/01/2017] thiamine  100 mg Oral Daily   Continuous Infusions: PRN Meds:.acetaminophen **OR** acetaminophen, HYDROmorphone (DILAUDID) injection, ondansetron **OR** ondansetron (ZOFRAN) IV Medications Prior to Admission:  Prior to Admission medications   Medication Sig Start Date End Date Taking? Authorizing Provider    acetaminophen (TYLENOL) 500 MG tablet Take 1,000 mg by mouth 3 (three) times daily.   Yes [provider]  Amino Acids-Protein Hydrolys (FEEDING SUPPLEMENT, PRO-STAT SUGAR FREE 64,) LIQD Take 30 mLs by mouth 2 (two) times daily.   Yes [provider]  carbamazepine (TEGRETOL) 200 MG tablet Take 200 mg by mouth 3 (three) times daily.   Yes [provider]  Cholecalciferol (VITAMIN D3 PO) Take 1 tablet by mouth daily.   Yes [provider]  CRANBERRY PO Take 1 tablet by mouth daily.   Yes [provider]  DULoxetine (CYMBALTA) 60 MG capsule Take 60 mg by mouth 2 (two) times daily.   Yes [provider]  gabapentin (NEURONTIN) 300 MG capsule Take 900-1,200 mg by mouth 3 (three) times daily. Take 900mg  by mouth twice daily, and take 1200mg  by mouth at bedtime 11/25/16  Yes [provider]  HYDROmorphone (DILAUDID) 4 MG tablet Take 1 tablet (4 mg total) by mouth every 4 (four) hours as needed for severe pain. 02/21/17  Yes Emokpae, Courage, MD  lisinopril (PRINIVIL,ZESTRIL) 20 MG tablet Take 20 mg by mouth daily.   Yes [provider]  Multiple Vitamin (MULTIVITAMIN WITH MINERALS) TABS tablet Take 1 tablet by mouth daily.   Yes [provider]  naproxen sodium (ANAPROX) 220 MG tablet Take 440 mg by mouth 2 (two) times daily with a meal.   Yes [provider]  Omega-3 Fatty Acids (FISH OIL PO) Take 1 capsule by mouth 2 (two) times daily.   Yes [provider]  ranitidine (ZANTAC) 75 MG tablet Take 75 mg by mouth 2 (two) times daily.   Yes [provider]  tiZANidine (ZANAFLEX) 4 MG tablet Take 4 mg by mouth 2 (two) times daily.    Yes [provider]  tiZANidine (ZANAFLEX) 4 MG tablet Take 4 mg by mouth 2 (two) times daily as needed for muscle spasms.   Yes [provider]  polyethylene glycol (MIRALAX / GLYCOLAX) packet Take 17 g by mouth daily as needed for mild constipation.  02/21/17   02/23/17, MD   No Known Allergies Review of Systems +pain in back  Physical Exam NAD Awake resting in bed S1 S2 Clear Abdomen soft No edema Awake alert Does not appear to have focal deficits.   Vital Signs: BP (!) 175/77 (BP Location: Right Arm)   Pulse 86   Temp 98.6 F (37 C) (Oral)   Resp 18   Ht 5\' 9"  (1.753 m)   Wt 87.7 kg (193 lb 5.5 oz)   SpO2 93%   BMI 28.55 kg/m  Pain Assessment: 0-10  Pain Score: 5    SpO2: SpO2: 93 % O2 Device:SpO2: 93 % O2 Flow Rate: .O2 Flow Rate (L/min): 2 L/min  IO: Intake/output summary:  Intake/Output Summary (Last 24 hours) at 02/28/17 1258 Last data filed at 02/28/17 1607  Gross per 24 hour  Intake          2158.33 ml  Output             1975 ml  Net           183.33 ml    LBM:   Baseline Weight: Weight: 87.7 kg (193 lb 5.5 oz) Most recent weight: Weight: 87.7 kg (193 lb 5.5 oz)     Palliative Assessment/Data:     Time In:  12 Time Out:  1300 Time Total:  60 min  Greater than 50%  of this time was spent counseling and coordinating care related to the above assessment and plan.  Signed by: Rosalin Hawking, MD  [phone]  Please contact Palliative Medicine Team phone at [phone] for questions and concerns.  For individual provider: See Loretha Stapler

## 2017-03-01 ENCOUNTER — Other Ambulatory Visit: Payer: Self-pay | Admitting: *Deleted

## 2017-03-01 LAB — MRSA PCR SCREENING: MRSA BY PCR: NEGATIVE

## 2017-03-01 MED ORDER — HYDROMORPHONE HCL 1 MG/ML IJ SOLN
0.5000 mg | Freq: Once | INTRAMUSCULAR | Status: AC
  Administered 2017-03-01: 0.5 mg via INTRAVENOUS
  Filled 2017-03-01: qty 0.5

## 2017-03-01 MED ORDER — HYDROMORPHONE HCL 2 MG PO TABS
1.0000 mg | ORAL_TABLET | Freq: Four times a day (QID) | ORAL | Status: DC | PRN
  Administered 2017-03-01 – 2017-03-02 (×3): 1 mg via ORAL
  Filled 2017-03-01 (×3): qty 1

## 2017-03-01 NOTE — Progress Notes (Signed)
Followed up with [employer] re: SNF authorization request.  Authorization denied per Clydie Braun, medical director advised based on documentation pt unlikely to benefit/participate in rehab and care primarily custodial. Discussed with attending MD and with pt. Pt's family unavailable at the time, pt states she will discuss private pay for SNF with spouse tonight.   Disposition plan: TBD.  Ilean Skill, MSW, LCSW Clinical Social Work 03/01/2017 [phone]

## 2017-03-01 NOTE — Consult Note (Addendum)
   [facility] Inpatient Consult   03/01/2017  JAELIE AGUILERA [birth year] [id number]   Mrs. Brendel was recently engaged for [facility] program. Please see chart review then encounters for further patient outreach details.  Spoke with Mr. Rake at bedside. Written consent obtained.   Mr. Billiot had just found out that insurance would not pay for SNF. Mr. Lague endorses that he is unable to care for patient at home and is not sure what to do. States patient has declined since her fall.   Mr. Gomes states is hopeful that insurance would reconsider authorizing a few more days.   Discussed above with inpatient LCSW and (covering) [facility] LCSW.   Will continue to follow. [facility] Care Management packet, contact information, and 24-hr nurse line magnet provided.   Mr. Vanecek cell number is [phone].   [name], MSN-Ed, RN,BSN [facility] Liaison [phone]

## 2017-03-01 NOTE — Progress Notes (Signed)
Pt bladder scan returned volume of 787. Alerted on call K. Schorr. Verbal order gv for in and out cath (once). Received output from in and out of 550cc. Will continue to monitor. Golden Hurter

## 2017-03-01 NOTE — Progress Notes (Signed)
Repeat bladder scan with 652 cc of urine. Unable to void. Patient has reduced mobility. Plan to insert foley catheter and reassess in rehab/SNF after discharge. I have discussed with patient's husband and son at bedside. They can't take care of patient at home. Pt came from SNF. I believe patient will benefit from SNF on discharge. SW following. D/w patient and family. Updated care plan.

## 2017-03-01 NOTE — Patient Outreach (Signed)
[facility]) Care Management  03/01/2017  [name] 06/29/1946 [id number]   CSW spoke with patient's husband by phone who reports she was rehospitalized and is hopeful for dc back to SNF rehab by end of week.  CSW discussed [facility] program role/services to which he was very receptive of, and asked that we follow up at [facility].  CSW will follow for return to SNF and update assigned CSW, [place], LCSW, for follow up next week.    Reece Levy, MSW, LCSW Clinical Social Worker  [facility] [employer] [phone]

## 2017-03-01 NOTE — Progress Notes (Signed)
PROGRESS NOTE    Theresa Flowers  OVF:[id number] DOB: [birth year] DOA: 02/26/2017 PCP: Andreas Blower., MD   Brief Narrative: 71 year old female with history of diffuse degenerative disc disease of the spine, neuropathy, on multiple pain medications, in nursing home presented with hyporesponsiveness at the skilled facility. She was treated with Narcan with improvement in mental status. Also found to have acute kidney injury. Patient reported weakness in extremities. CT scan of head with no acute finding. Patient has history of multiple falls in the past, had epidural injection, severe neuroforaminal narrowing and multilevel lumbar disc disease.  Assessment & Plan:  # Acute encephalopathy likely multifactorial due to dehydration and polypharmacy/toxic. -Patient with no focal neurological deficit. CT scan of head and MRI of the brain with no acute finding. Patient's mental status likely due to multiple sedatives/narcotics including high-dose Neurontin in the setting of renal failure. Now patient's mental status is improved to around baseline. She has baseline confusion. Renal function improved. Slowly titrating up the dose of Neurontin. Reduced the dose of dilaudid and changed to oral form today.  Patient with severe degenerative and disc disease. Palliative care consult appreciated and they agree with the current pain regimen. Patient with decreased mobility because of severe degenerative disc disease and stenosis. Evaluated by PT and OT recommended a skilled nursing facility. I believe patient will benefit from close observation and rehabilitation at a skilled facility. I recommended to follow-up with pain management clinic as well as with orthopedics. I have discussed with the patient's husband and son for last 2 days. Monitor for pain management and mentally status on oral medication today. Patient is medically stable to transfer her care to a skilled nursing facility when bed is available. Social  worker evaluation ongoing.  -MRI of cervical spine did show chronic degenerative changes with no acute finding. She needs therapies and close pain management follow ups.  # Acute kidney injury likely prerenal etiology and in the setting of lisinopril and NSAIDs use. Patient also with acute urinary retention likely contributed by narcotics/sedatives. Bladder scan with more than >1000 mL of urine on admission.  -Renal function improved. The Foley catheter was removed. Patient required 1 intermittent catheterization last night. Discussed with the nurse for bladder scan to see if patient has no recurrent urinary retention. If persistent urinary retention she may need to go to skilled facility is  # Hyponatremia in the setting of dehydration. Serum sodium level improved.  # Essential hypertension: Monitor blood pressure. SContinue Norvasc. Monitor blood pressure. Pain management.  # Chronic back pain: Resume lower dose Neurontin and pain management. Imaging studies with no acute finding.  Chronic normocytic anemia: hb stable. F/u PCP Hyperammonemia, mild on admission: improved    Active Problems:   Neuropathy   Chronic back pain   Essential hypertension   Acute renal failure (HCC)   Hyponatremia   Dehydration   Anemia   Acute encephalopathy   ARF (acute renal failure) (HCC)   Acute kidney injury (HCC)   DVT prophylaxis: Lovenox subcutaneous Code Status: Full code Family Communication: Discussed with the patient's husband and son on June 26 and 27 Disposition Plan: Discharge to skilled facility when bed is available    Consultants:   None  Procedures: None Antimicrobials: None  Subjective: Seen and examined at bedside. Patient is alert awake and oriented to hospital and her name. Difficulty with a month. Denied pain. Review of system is limited because of her intermittent confusion.  Objective: Vitals:   02/28/17 2041 03/01/17  0617 03/01/17 0843 03/01/17 1214  BP: (!)  149/74 (!) 169/81 (!) 171/84 (!) 154/77  Pulse: 80 90 93 97  Resp: 16 18 16 16   Temp: 98.6 F (37 C) 98.7 F (37.1 C)  98.5 F (36.9 C)  TempSrc: Oral Oral  Oral  SpO2: 98% 93% 94% 95%  Weight:      Height:        Intake/Output Summary (Last 24 hours) at 03/01/17 1412 Last data filed at 03/01/17 1000  Gross per 24 hour  Intake                0 ml  Output              850 ml  Net             -850 ml   Filed Weights   02/27/17 0159  Weight: 87.7 kg (193 lb 5.5 oz)    Examination:  General exam: Appears calm and comfortable  Respiratory system: Clear to auscultation. Respiratory effort normal. No wheezing or crackle Cardiovascular system: S1 & S2 heard, RRR.  No pedal edema. Gastrointestinal system: Abdomen is soft and nontender. Normal bowel sounds heard. Central nervous system: Alert Awake and following commands Skin: No rashes, lesions or ulcers Ext: no LE edema.   Data Reviewed: I have personally reviewed following labs and imaging studies  CBC:  Recent Labs Lab 02/26/17 2053 02/27/17 0459  WBC 10.3 9.5  NEUTROABS 8.8*  --   HGB 9.7* 10.1*  HCT 28.0* 29.0*  MCV 85.1 85.0  PLT 283 273   Basic Metabolic Panel:  Recent Labs Lab 02/26/17 2053 02/27/17 0459 02/28/17 0448 02/28/17 1037  NA 129* 133*  --  138  K 4.8 4.4  --  3.9  CL 93* 97*  --  103  CO2 24 24  --  25  GLUCOSE 128* 107*  --  173*  BUN 115* 108*  --  51*  CREATININE 3.46* 2.79*  --  0.93  CALCIUM 8.7* 8.8*  --  9.3  MG  --  2.4 2.0  --   PHOS  --  6.1*  --   --    GFR: Estimated Creatinine Clearance: 65.5 mL/min (by C-G formula based on SCr of 0.93 mg/dL). Liver Function Tests:  Recent Labs Lab 02/26/17 2053 02/27/17 0459  AST 60* 52*  ALT 50 50  ALKPHOS 86 90  BILITOT 0.4 0.5  PROT 6.6 6.7  ALBUMIN 2.8* 2.8*   No results for input(s): LIPASE, AMYLASE in the last 168 hours.  Recent Labs Lab 02/26/17 2227 02/27/17 0459  AMMONIA 56* 11   Coagulation Profile: No  results for input(s): INR, PROTIME in the last 168 hours. Cardiac Enzymes: No results for input(s): CKTOTAL, CKMB, CKMBINDEX, TROPONINI in the last 168 hours. BNP (last 3 results) No results for input(s): PROBNP in the last 8760 hours. HbA1C: No results for input(s): HGBA1C in the last 72 hours. CBG:  Recent Labs Lab 02/26/17 1928  GLUCAP 133*   Lipid Profile: No results for input(s): CHOL, HDL, LDLCALC, TRIG, CHOLHDL, LDLDIRECT in the last 72 hours. Thyroid Function Tests:  Recent Labs  02/27/17 0459  TSH 0.994   Anemia Panel: No results for input(s): VITAMINB12, FOLATE, FERRITIN, TIBC, IRON, RETICCTPCT in the last 72 hours. Sepsis Labs: No results for input(s): PROCALCITON, LATICACIDVEN in the last 168 hours.  No results found for this or any previous visit (from the past 240 hour(s)).  Radiology Studies: Mr Brain Wo Contrast  Result Date: 02/27/2017 CLINICAL DATA:  Initial evaluation for acute encephalopathy, weakness. EXAM: MRI HEAD WITHOUT CONTRAST MRI CERVICAL SPINE WITHOUT CONTRAST TECHNIQUE: Multiplanar, multiecho pulse sequences of the brain and surrounding structures, and cervical spine, to include the craniocervical junction and cervicothoracic junction, were obtained without intravenous contrast. COMPARISON:  Prior CT from 02/26/2017 as well as previous MRI of the cervical spine from 05/01/2011. FINDINGS: MRI HEAD FINDINGS Brain: Study degraded by motion artifact. Mild diffuse prominence of the CSF containing spaces compatible with generalized age-related cerebral atrophy. Patchy and confluent T2/FLAIR hyperintensity within the periventricular and deep white matter both cerebral hemispheres most consistent with chronic small vessel ischemic disease, mild to moderate nature. No abnormal foci of restricted diffusion to suggest acute or subacute ischemia. Gray-white matter differentiation maintained. No encephalomalacia to suggest chronic infarction. No evidence for  acute or chronic intracranial hemorrhage. No mass lesion, midline shift or mass effect. No hydrocephalus. No extra-axial fluid collection. Major dural sinuses are grossly patent. Pituitary gland suprasellar region within normal limits. Midline structures intact and normal. Vascular: Major intracranial vascular flow voids are maintained. Skull and upper cervical spine: Craniocervical junction within normal limits. Bone marrow signal intensity normal. No scalp soft tissue abnormality. Sinuses/Orbits: Visualized globes and orbital soft tissues within normal limits. Paranasal sinuses are clear. Trace opacity within the bilateral mastoid air cells, of doubtful significance. MRI CERVICAL SPINE FINDINGS Alignment: Study severely limited by a motion artifact. 4 mm anterolisthesis of C4 on C5, with trace retrolisthesis of C5 on C6. Straightening of the normal cervical lordosis. Alignment is stable from previous. Vertebrae: Vertebral body heights maintained. No evidence for acute or chronic fracture. Benign hemangioma noted within the C7 vertebral body. No worrisome osseous lesions. Mild reactive endplate changes present about the posterior C5-6 interspace. Cord: Evaluation of the cervical spinal cord limited on this motion degraded exam. No obvious cord signal abnormality. Posterior Fossa, vertebral arteries, paraspinal tissues: Craniocervical junction normal. There is apparent edema within knee right greater the left posterior paraspinous musculature (series 3, image 2, 12), of uncertain etiology or significance. This extends from the skullbase/suboccipital region through the T1-2 level on the right, and to approximately C5-6 on the left. No abnormal prevertebral edema. No other obvious abnormality within knee paraspinous soft tissues on this motion degraded exam. Disc levels: C2-C3: Mild disc bulge. Moderate facet hypertrophy. No significant spinal stenosis. Severe right with mild left C3 foraminal stenosis grossly stable.  C3-C4: Degenerative disc bulge with probable small central disc protrusion, grossly similar to previous. Moderate bilateral facet hypertrophy, greater on the left. Grossly stable mild spinal stenosis. Moderate to severe bilateral C4 foraminal stenosis. C4-C5: Moderate degenerative circumferential disc osteophyte complex. Severe left with moderate 5-6 mm in AP diameter, grossly similar to previous. Severe bilateral C5 foraminal stenosis. C5-C6: Chronic circumferential disc osteophyte complex. Severe facet hypertrophy. Resultant severe canal stenosis with flattening of the cervical spinal cord. No obvious cord signal changes. Thecal sac measures approximately 5-6 mm in AP diameter. Severe bilateral C6 foraminal stenosis. C6-C7: Left eccentric disc osteophyte complex. Moderate facet hypertrophy. Moderate spinal stenosis. Severe left with moderate right C7 foraminal narrowing, grossly similar. C7-T1: Degenerative disc bulge. Facet hypertrophy. No significant canal stenosis. Grossly similar moderate bilateral C8 foraminal narrowing. Visualized upper thoracic spine demonstrates degenerative disc bulging at T2-3, T3-4, T4-5 without significant stenosis. IMPRESSION: MRI HEAD IMPRESSION: 1. No acute intracranial process identified. 2. Generalized age-related cerebral atrophy with mild to moderate chronic small vessel ischemic disease. MRI  CERVICAL SPINE IMPRESSION: 1. Severely limited study due to motion artifact. 2. Edema within the posterior paraspinous musculature of the neck, right slightly greater than left. Finding is nonspecific, and of uncertain clinical significance. Possible muscular injury/strain could have this appearance. Infection/myositis could be considered, although there is no other findings to suggest acute infection within the underlying cervical spine itself. Correlation with symptomatology, history, and laboratory values recommended. 3. Severe multilevel degenerative spondylolysis extending from C3-4  through C6-7, grossly similar relative to prior MRI from 05/01/2011. Moderate to severe canal stenosis at these levels, greatest at C4-5 and C5-6. No obvious cord signal abnormality. Multifactorial severe neural foraminal narrowing on the right at C3, bilateral C5, bilateral C6, and on the left at C7. Electronically Signed   By: Rise Mu M.D.   On: 02/27/2017 22:46   Mr Cervical Spine Wo Contrast  Result Date: 02/27/2017 CLINICAL DATA:  Initial evaluation for acute encephalopathy, weakness. EXAM: MRI HEAD WITHOUT CONTRAST MRI CERVICAL SPINE WITHOUT CONTRAST TECHNIQUE: Multiplanar, multiecho pulse sequences of the brain and surrounding structures, and cervical spine, to include the craniocervical junction and cervicothoracic junction, were obtained without intravenous contrast. COMPARISON:  Prior CT from 02/26/2017 as well as previous MRI of the cervical spine from 05/01/2011. FINDINGS: MRI HEAD FINDINGS Brain: Study degraded by motion artifact. Mild diffuse prominence of the CSF containing spaces compatible with generalized age-related cerebral atrophy. Patchy and confluent T2/FLAIR hyperintensity within the periventricular and deep white matter both cerebral hemispheres most consistent with chronic small vessel ischemic disease, mild to moderate nature. No abnormal foci of restricted diffusion to suggest acute or subacute ischemia. Gray-white matter differentiation maintained. No encephalomalacia to suggest chronic infarction. No evidence for acute or chronic intracranial hemorrhage. No mass lesion, midline shift or mass effect. No hydrocephalus. No extra-axial fluid collection. Major dural sinuses are grossly patent. Pituitary gland suprasellar region within normal limits. Midline structures intact and normal. Vascular: Major intracranial vascular flow voids are maintained. Skull and upper cervical spine: Craniocervical junction within normal limits. Bone marrow signal intensity normal. No scalp  soft tissue abnormality. Sinuses/Orbits: Visualized globes and orbital soft tissues within normal limits. Paranasal sinuses are clear. Trace opacity within the bilateral mastoid air cells, of doubtful significance. MRI CERVICAL SPINE FINDINGS Alignment: Study severely limited by a motion artifact. 4 mm anterolisthesis of C4 on C5, with trace retrolisthesis of C5 on C6. Straightening of the normal cervical lordosis. Alignment is stable from previous. Vertebrae: Vertebral body heights maintained. No evidence for acute or chronic fracture. Benign hemangioma noted within the C7 vertebral body. No worrisome osseous lesions. Mild reactive endplate changes present about the posterior C5-6 interspace. Cord: Evaluation of the cervical spinal cord limited on this motion degraded exam. No obvious cord signal abnormality. Posterior Fossa, vertebral arteries, paraspinal tissues: Craniocervical junction normal. There is apparent edema within knee right greater the left posterior paraspinous musculature (series 3, image 2, 12), of uncertain etiology or significance. This extends from the skullbase/suboccipital region through the T1-2 level on the right, and to approximately C5-6 on the left. No abnormal prevertebral edema. No other obvious abnormality within knee paraspinous soft tissues on this motion degraded exam. Disc levels: C2-C3: Mild disc bulge. Moderate facet hypertrophy. No significant spinal stenosis. Severe right with mild left C3 foraminal stenosis grossly stable. C3-C4: Degenerative disc bulge with probable small central disc protrusion, grossly similar to previous. Moderate bilateral facet hypertrophy, greater on the left. Grossly stable mild spinal stenosis. Moderate to severe bilateral C4 foraminal stenosis. C4-C5: Moderate degenerative  circumferential disc osteophyte complex. Severe left with moderate 5-6 mm in AP diameter, grossly similar to previous. Severe bilateral C5 foraminal stenosis. C5-C6: Chronic  circumferential disc osteophyte complex. Severe facet hypertrophy. Resultant severe canal stenosis with flattening of the cervical spinal cord. No obvious cord signal changes. Thecal sac measures approximately 5-6 mm in AP diameter. Severe bilateral C6 foraminal stenosis. C6-C7: Left eccentric disc osteophyte complex. Moderate facet hypertrophy. Moderate spinal stenosis. Severe left with moderate right C7 foraminal narrowing, grossly similar. C7-T1: Degenerative disc bulge. Facet hypertrophy. No significant canal stenosis. Grossly similar moderate bilateral C8 foraminal narrowing. Visualized upper thoracic spine demonstrates degenerative disc bulging at T2-3, T3-4, T4-5 without significant stenosis. IMPRESSION: MRI HEAD IMPRESSION: 1. No acute intracranial process identified. 2. Generalized age-related cerebral atrophy with mild to moderate chronic small vessel ischemic disease. MRI CERVICAL SPINE IMPRESSION: 1. Severely limited study due to motion artifact. 2. Edema within the posterior paraspinous musculature of the neck, right slightly greater than left. Finding is nonspecific, and of uncertain clinical significance. Possible muscular injury/strain could have this appearance. Infection/myositis could be considered, although there is no other findings to suggest acute infection within the underlying cervical spine itself. Correlation with symptomatology, history, and laboratory values recommended. 3. Severe multilevel degenerative spondylolysis extending from C3-4 through C6-7, grossly similar relative to prior MRI from 05/01/2011. Moderate to severe canal stenosis at these levels, greatest at C4-5 and C5-6. No obvious cord signal abnormality. Multifactorial severe neural foraminal narrowing on the right at C3, bilateral C5, bilateral C6, and on the left at C7. Electronically Signed   By: Rise Mu M.D.   On: 02/27/2017 22:46        Scheduled Meds: . amLODipine  5 mg Oral Daily  . carbamazepine   200 mg Oral TID  . DULoxetine  60 mg Oral BID  . famotidine  10 mg Oral Daily  . feeding supplement  1 Container Oral TID BM  . gabapentin  400 mg Oral TID  . heparin  5,000 Units Subcutaneous Q8H  . lactulose  10 g Oral BID  . methocarbamol  750 mg Oral TID  . polyethylene glycol  17 g Oral Daily  . sodium chloride flush  3 mL Intravenous Q12H  . thiamine  100 mg Oral Daily   Continuous Infusions:   LOS: 2 days    Luara Faye Jaynie Collins, MD [facility] Pager [phone]  If 7PM-7AM, please contact night-coverage [email or web address] Password [email or web address] 03/01/2017, 2:12 PM

## 2017-03-02 DIAGNOSIS — R4182 Altered mental status, unspecified: Secondary | ICD-10-CM

## 2017-03-02 MED ORDER — AMLODIPINE BESYLATE 5 MG PO TABS: 5.0000 mg | ORAL_TABLET | Freq: Every day | ORAL | 0 refills | Status: AC

## 2017-03-02 MED ORDER — HYDROMORPHONE HCL 2 MG PO TABS
2.0000 mg | ORAL_TABLET | Freq: Four times a day (QID) | ORAL | Status: DC | PRN
Start: 2017-03-02 — End: 2017-03-02

## 2017-03-02 MED ORDER — GABAPENTIN 400 MG PO CAPS: 400.0000 mg | ORAL_CAPSULE | Freq: Three times a day (TID) | ORAL | 0 refills | Status: AC

## 2017-03-02 MED ORDER — DICLOFENAC EPOLAMINE 1.3 % TD PTCH: 1.0000 | MEDICATED_PATCH | Freq: Two times a day (BID) | TRANSDERMAL | 0 refills | Status: DC

## 2017-03-02 MED ORDER — HYDROMORPHONE HCL 2 MG PO TABS: 2.0000 mg | ORAL_TABLET | Freq: Four times a day (QID) | ORAL | 0 refills | Status: AC | PRN

## 2017-03-02 MED ORDER — METHOCARBAMOL 750 MG PO TABS
750.0000 mg | ORAL_TABLET | Freq: Three times a day (TID) | ORAL | 0 refills | Status: DC
Start: 2017-03-02 — End: 2017-06-15

## 2017-03-02 MED ORDER — DICLOFENAC EPOLAMINE 1.3 % TD PTCH
1.0000 | MEDICATED_PATCH | Freq: Two times a day (BID) | TRANSDERMAL | Status: DC
  Administered 2017-03-02: 1 via TRANSDERMAL
  Filled 2017-03-02: qty 1

## 2017-03-02 NOTE — Progress Notes (Addendum)
CSW following.

## 2017-03-02 NOTE — Progress Notes (Signed)
03/02/17  1650  Copy of AVS given to EMS to give to [facility].

## 2017-03-02 NOTE — Progress Notes (Signed)
03/02/17  1552  Called [facility] and gave report to [place] [phone].

## 2017-03-02 NOTE — Progress Notes (Addendum)
Met with pt, husband, and son along with CM and MD. Discussed at length pt's options for DC plan considering SNF insurance denial.  Pt's son and husband are adamant that they cannot meet pt's needs at home with home health services.  CSW consulted financial counseling to advise as to pt's eligibility for MCD however pt's husband states that at this time pt as assets and an long term care insurance policy.  Pt and husband spoke again with SNF [facility]) financial office and report they will pay privately for SNF while investigating medicaid and/or details of her long-term care policy..  CSW notified facility. Will transport via [facility] completed medical necessity form and arranged transportation.   Plan: Will DC to [facility] today. Report Number for RN: [phone].    Sharren Bridge, MSW, LCSW Clinical Social Work 03/02/2017 [phone]

## 2017-03-02 NOTE — Discharge Summary (Signed)
Physician Discharge Summary  Theresa Flowers ZOX:[id number] DOB: Apr 21, 1946 DOA: 02/26/2017  PCP: Andreas Blower., MD  Admit date: 02/26/2017 Discharge date: 03/02/2017  Time spent: 40 minutes  Recommendations for Outpatient Follow-up:  1. Recommend lower doses of Dilaudid on discharge and recommend lower doses of multiple other meds including gabapentin given patient being prone to metabolic encephalopathy and overmedication for her pain 2. Do not use ACE inhibitor or diuretic as she had a creatinine of 3 this admission 3. Patient will resume skilled care services and will be discharged to [facility] skilled nursing facility 4.  patient will need outpatient clamping trials of her Foley catheter and may need coordination by skilled nursing facility physician to be seen by urologist if cannot void  Discharge Diagnoses:  Active Problems:   Neuropathy   Chronic back pain   Essential hypertension   Acute renal failure (HCC)   Hyponatremia   Dehydration   Anemia   Acute encephalopathy   ARF (acute renal failure) (HCC)   Acute kidney injury (HCC)   Discharge Condition: Fair  Diet recommendation: Heart healthy  Filed Weights   02/27/17 0159  Weight: 87.7 kg (193 lb 5.5 oz)    History of present illness:  71 year old female with history of diffuse degenerative disc disease of the spine, neuropathy, on multiple pain medications, in nursing home presented with hyporesponsiveness at the skilled facility. She was treated with Narcan with improvement in mental status. Also found to have acute kidney injury. Patient reported weakness in extremities. CT scan of head with no acute finding. Patient has history of multiple falls in the past, had epidural injection, severe neuroforaminal narrowing and multilevel lumbar disc disease.   Hospital Course:  Acute encephalopathy likely multifactorial due to dehydration and polypharmacy/toxic. -Patient with no focal neurological deficit. CT scan of  head and MRI of the brain with no acute finding. Patient's mental status likely due to multiple sedatives/narcotics including high-dose Neurontin in the setting of renal failure.  mental status is improved to around baseline. . Slowly titrating up the dose of Neurontin. Reduced the dose of dilaudid and changed to oral form today.  Patient with severe degenerative and disc disease.  Palliative care consult appreciated and they agree with the current pain regimen.  Patient with decreased mobility because of severe degenerative disc disease and stenosis.  Evaluated by PT and OT recommended a skilled nursing facility. I believe patient will benefit from close observation and rehabilitation at a skilled facility.  I recommended to follow-up with pain management clinic as well as with orthopedics. Monitor for pain management and mentally status on oral medication today. Pati  -MRI of cervical spine did show chronic degenerative changes with no acute finding. She needs therapies and close pain management follow ups.  # Acute kidney injury likely prerenal etiology and in the setting of lisinopril and NSAIDs use. Patient also with acute urinary retention likely contributed by narcotics/sedatives. Bladder scan with more than >1000 mL of urine on admission.   -Renal function improved. The Foley catheter was removed. Patient required 1 intermittent catheterization last night. Discussed with the nurse for bladder scan to see if patient has no recurrent urinary retention. If persistent urinary retention she may need to go to skilled facility is  # Hyponatremia in the setting of dehydration. Serum sodium level improved.  # Essential hypertension: Monitor blood pressure. Continue Norvasc. Monitor blood pressure. Pain management.  # Chronic back pain: Resume lower dose Neurontin and pain management. Imaging studies with no  acute finding.  Chronic normocytic anemia: hb stable. F/u PCP Hyperammonemia, mild  on admission: improved    Consultations:  Urologist  Discharge Exam: Vitals:   03/02/17 0425 03/02/17 0900  BP: (!) 176/74 (!) 156/61  Pulse: 78 85  Resp: 18 20  Temp: 98.7 F (37.1 C)     General: Alert pleasant but in pain Was impacted earlier today and disimpacted by nursing Cardiovascular: S1-S2 no murmur rub or gallop Respiratory: Clinically clear no added sound Abdomen soft nontender nondistended no rebound or guarding  Discharge Instructions   Discharge Instructions    Diet - low sodium heart healthy    Complete by:  As directed    Increase activity slowly    Complete by:  As directed      Current Discharge Medication List    START taking these medications   Details  amLODipine (NORVASC) 5 MG tablet Take 1 tablet (5 mg total) by mouth daily. Qty: 30 tablet, Refills: 0    diclofenac (FLECTOR) 1.3 % PTCH Place 1 patch onto the skin 2 (two) times daily. Qty: 60 patch, Refills: 0    methocarbamol (ROBAXIN) 750 MG tablet Take 1 tablet (750 mg total) by mouth 3 (three) times daily. Qty: 90 tablet, Refills: 0      CONTINUE these medications which have CHANGED   Details  gabapentin (NEURONTIN) 400 MG capsule Take 1 capsule (400 mg total) by mouth 3 (three) times daily. Qty: 90 capsule, Refills: 0    HYDROmorphone (DILAUDID) 2 MG tablet Take 1 tablet (2 mg total) by mouth every 6 (six) hours as needed for severe pain. Qty: 30 tablet, Refills: 0      CONTINUE these medications which have NOT CHANGED   Details  acetaminophen (TYLENOL) 500 MG tablet Take 1,000 mg by mouth 3 (three) times daily.    Amino Acids-Protein Hydrolys (FEEDING SUPPLEMENT, PRO-STAT SUGAR FREE 64,) LIQD Take 30 mLs by mouth 2 (two) times daily.    carbamazepine (TEGRETOL) 200 MG tablet Take 200 mg by mouth 3 (three) times daily.    Cholecalciferol (VITAMIN D3 PO) Take 1 tablet by mouth daily.    CRANBERRY PO Take 1 tablet by mouth daily.    DULoxetine (CYMBALTA) 60 MG capsule Take  60 mg by mouth 2 (two) times daily.    Multiple Vitamin (MULTIVITAMIN WITH MINERALS) TABS tablet Take 1 tablet by mouth daily.    Omega-3 Fatty Acids (FISH OIL PO) Take 1 capsule by mouth 2 (two) times daily.    ranitidine (ZANTAC) 75 MG tablet Take 75 mg by mouth 2 (two) times daily.    polyethylene glycol (MIRALAX / GLYCOLAX) packet Take 17 g by mouth daily as needed for mild constipation. Qty: 14 each, Refills: 0      STOP taking these medications     lisinopril (PRINIVIL,ZESTRIL) 20 MG tablet      naproxen sodium (ANAPROX) 220 MG tablet      tiZANidine (ZANAFLEX) 4 MG tablet      tiZANidine (ZANAFLEX) 4 MG tablet        No Known Allergies    The results of significant diagnostics from this hospitalization (including imaging, microbiology, ancillary and laboratory) are listed below for reference.    Significant Diagnostic Studies: Dg Chest 1 View  Result Date: 02/26/2017 CLINICAL DATA:  Altered mental status EXAM: CHEST 1 VIEW COMPARISON:  None. FINDINGS: Heart is upper limits normal in size. Lungs are clear. No effusions or acute bony abnormality. IMPRESSION: No active disease. Electronically  Signed   By: Charlett Nose M.D.   On: 02/26/2017 20:44   Dg Lumbar Spine Complete  Result Date: 02/19/2017 CLINICAL DATA:  71 year old female with fall from bed presenting with low back pain. EXAM: LUMBAR SPINE - COMPLETE 4+ VIEW COMPARISON:  Lumbar spine CT dated 01/17/2011 FINDINGS: There is no acute fracture or subluxation of the lumbar spine. There multilevel degenerative changes with endplate irregularity, disc space narrowing, and multilevel disc desiccation with vacuum phenomena. There is grade 1 L4-L5 anterolisthesis. Multilevel facet hypertrophy noted. The visualized transverse and spinous processes appear intact. Moderate stool noted in the visualized colon. The soft tissues are otherwise unremarkable. IMPRESSION: 1. No acute/traumatic lumbar spine pathology. 2. Multilevel  degenerative changes. Electronically Signed   By: Elgie Collard M.D.   On: 02/19/2017 19:12   Ct Head Wo Contrast  Result Date: 02/26/2017 CLINICAL DATA:  Altered mental status EXAM: CT HEAD WITHOUT CONTRAST TECHNIQUE: Contiguous axial images were obtained from the base of the skull through the vertex without intravenous contrast. COMPARISON:  None. FINDINGS: Brain: No acute territorial infarction, hemorrhage or intracranial mass is seen. Mild to moderate periventricular and subcortical white matter hypodensity. Mild to moderate atrophy. Ventricles are nonenlarged. Vascular: No hyperdense vessels.  Carotid artery calcifications. Skull: No fracture or suspicious bone lesion. Sinuses/Orbits: No acute finding. Other: None IMPRESSION: 1. No CT evidence for acute intracranial abnormality. 2. Moderate periventricular and subcortical small vessel ischemic changes of the white matter. Electronically Signed   By: Jasmine Pang M.D.   On: 02/26/2017 20:35   Ct Lumbar Spine Wo Contrast  Result Date: 02/19/2017 CLINICAL DATA:  Larey Seat this morning, no loss of consciousness. Severe low back pain radiating to legs. EXAM: CT LUMBAR SPINE WITHOUT CONTRAST TECHNIQUE: Multidetector CT imaging of the lumbar spine was performed without intravenous contrast administration. Multiplanar CT image reconstructions were also generated. COMPARISON:  On lumbar spine radiographs February 19, 2017 at 1835 hours and CT lumbar spine Jan 17, 2011 FINDINGS: SEGMENTATION: For the purposes of this report the last well-formed intervertebral disc space is reported as L5-S1. ALIGNMENT: Maintained lumbar lordosis. Minimal grade 1 L4-5 anterolisthesis. No spondylolysis. Mild broad levoscoliosis on this nonweightbearing examination. VERTEBRAE: Vertebral bodies and posterior elements are intact. Severe T12-L1 thru L3-4 disc height loss is unchanged. Moderate to severe L4-5 and L5-S1 disc height loss with vacuum disc all lumbar levels. No destructive bony  lesions. PARASPINAL AND OTHER SOFT TISSUES: Layering density in the gallbladder can be seen with sludge and, vicarious excretion of contrast. DISC LEVELS: T11-12: Small broad-based disc osteophyte complex, mild facet arthropathy and ligamentum flavum redundancy without canal stenosis. Severe bilateral neural foraminal narrowing. T12-L1: Moderate broad-based disc osteophyte complex. subcentimeter probable disc fragment LEFT lateral recess at T12 may affect the traversing LEFT L1 nerve. Moderate broad-based disc osteophyte complex. Moderate facet arthropathy and ligamentum flavum redundancy. Moderate canal stenosis. Moderate severe neural foraminal narrowing. L1-2: Small broad-based disc osteophyte complex, mild facet arthropathy and ligamentum flavum redundancy. No canal stenosis. Severe deer bilateral neural foraminal narrowing. L2-3: Small broad-based disc osteophyte complex asymmetric to the RIGHT. Moderate facet arthropathy on the RIGHT, along LEFT. No canal stenosis. Moderate to severe RIGHT, moderate LEFT neural foraminal narrowing. L3-4: Small broad-based disc osteophyte complex. Moderate to severe facet arthropathy and ligamentum flavum redundancy. Mild canal stenosis. Severe RIGHT, moderate LEFT neural foraminal narrowing. L4-5: Anterolisthesis. Moderate broad-based disc osteophyte complex. Severe facet arthropathy and ligamentum flavum redundancy. Moderate canal stenosis. Moderate to severe bilateral neural foraminal narrowing. L5-S1: Small broad-based  disc osteophyte complex. Moderate to severe facet arthropathy and ligamentum flavum redundancy without canal stenosis. Severe bilateral neural foraminal narrowing. IMPRESSION: Progressed degenerative change of the lumbar spine, grade 1 L4-5 anterolisthesis without spondylolysis. No acute fracture. Probable disc fragment ventral epidural space at T12, new from prior CT though, may not be acute. Fragment may affect the traversing LEFT L1 nerve. Moderate canal  stenosis T12-L1 and L4-5. Multilevel severe neural foraminal narrowing. Electronically Signed   By: Awilda Metro M.D.   On: 02/19/2017 21:56   Mr Brain Wo Contrast  Result Date: 02/27/2017 CLINICAL DATA:  Initial evaluation for acute encephalopathy, weakness. EXAM: MRI HEAD WITHOUT CONTRAST MRI CERVICAL SPINE WITHOUT CONTRAST TECHNIQUE: Multiplanar, multiecho pulse sequences of the brain and surrounding structures, and cervical spine, to include the craniocervical junction and cervicothoracic junction, were obtained without intravenous contrast. COMPARISON:  Prior CT from 02/26/2017 as well as previous MRI of the cervical spine from 05/01/2011. FINDINGS: MRI HEAD FINDINGS Brain: Study degraded by motion artifact. Mild diffuse prominence of the CSF containing spaces compatible with generalized age-related cerebral atrophy. Patchy and confluent T2/FLAIR hyperintensity within the periventricular and deep white matter both cerebral hemispheres most consistent with chronic small vessel ischemic disease, mild to moderate nature. No abnormal foci of restricted diffusion to suggest acute or subacute ischemia. Gray-white matter differentiation maintained. No encephalomalacia to suggest chronic infarction. No evidence for acute or chronic intracranial hemorrhage. No mass lesion, midline shift or mass effect. No hydrocephalus. No extra-axial fluid collection. Major dural sinuses are grossly patent. Pituitary gland suprasellar region within normal limits. Midline structures intact and normal. Vascular: Major intracranial vascular flow voids are maintained. Skull and upper cervical spine: Craniocervical junction within normal limits. Bone marrow signal intensity normal. No scalp soft tissue abnormality. Sinuses/Orbits: Visualized globes and orbital soft tissues within normal limits. Paranasal sinuses are clear. Trace opacity within the bilateral mastoid air cells, of doubtful significance. MRI CERVICAL SPINE FINDINGS  Alignment: Study severely limited by a motion artifact. 4 mm anterolisthesis of C4 on C5, with trace retrolisthesis of C5 on C6. Straightening of the normal cervical lordosis. Alignment is stable from previous. Vertebrae: Vertebral body heights maintained. No evidence for acute or chronic fracture. Benign hemangioma noted within the C7 vertebral body. No worrisome osseous lesions. Mild reactive endplate changes present about the posterior C5-6 interspace. Cord: Evaluation of the cervical spinal cord limited on this motion degraded exam. No obvious cord signal abnormality. Posterior Fossa, vertebral arteries, paraspinal tissues: Craniocervical junction normal. There is apparent edema within knee right greater the left posterior paraspinous musculature (series 3, image 2, 12), of uncertain etiology or significance. This extends from the skullbase/suboccipital region through the T1-2 level on the right, and to approximately C5-6 on the left. No abnormal prevertebral edema. No other obvious abnormality within knee paraspinous soft tissues on this motion degraded exam. Disc levels: C2-C3: Mild disc bulge. Moderate facet hypertrophy. No significant spinal stenosis. Severe right with mild left C3 foraminal stenosis grossly stable. C3-C4: Degenerative disc bulge with probable small central disc protrusion, grossly similar to previous. Moderate bilateral facet hypertrophy, greater on the left. Grossly stable mild spinal stenosis. Moderate to severe bilateral C4 foraminal stenosis. C4-C5: Moderate degenerative circumferential disc osteophyte complex. Severe left with moderate 5-6 mm in AP diameter, grossly similar to previous. Severe bilateral C5 foraminal stenosis. C5-C6: Chronic circumferential disc osteophyte complex. Severe facet hypertrophy. Resultant severe canal stenosis with flattening of the cervical spinal cord. No obvious cord signal changes. Thecal sac measures approximately 5-6 mm  in AP diameter. Severe bilateral  C6 foraminal stenosis. C6-C7: Left eccentric disc osteophyte complex. Moderate facet hypertrophy. Moderate spinal stenosis. Severe left with moderate right C7 foraminal narrowing, grossly similar. C7-T1: Degenerative disc bulge. Facet hypertrophy. No significant canal stenosis. Grossly similar moderate bilateral C8 foraminal narrowing. Visualized upper thoracic spine demonstrates degenerative disc bulging at T2-3, T3-4, T4-5 without significant stenosis. IMPRESSION: MRI HEAD IMPRESSION: 1. No acute intracranial process identified. 2. Generalized age-related cerebral atrophy with mild to moderate chronic small vessel ischemic disease. MRI CERVICAL SPINE IMPRESSION: 1. Severely limited study due to motion artifact. 2. Edema within the posterior paraspinous musculature of the neck, right slightly greater than left. Finding is nonspecific, and of uncertain clinical significance. Possible muscular injury/strain could have this appearance. Infection/myositis could be considered, although there is no other findings to suggest acute infection within the underlying cervical spine itself. Correlation with symptomatology, history, and laboratory values recommended. 3. Severe multilevel degenerative spondylolysis extending from C3-4 through C6-7, grossly similar relative to prior MRI from 05/01/2011. Moderate to severe canal stenosis at these levels, greatest at C4-5 and C5-6. No obvious cord signal abnormality. Multifactorial severe neural foraminal narrowing on the right at C3, bilateral C5, bilateral C6, and on the left at C7. Electronically Signed   By: Rise Mu M.D.   On: 02/27/2017 22:46   Mr Cervical Spine Wo Contrast  Result Date: 02/27/2017 CLINICAL DATA:  Initial evaluation for acute encephalopathy, weakness. EXAM: MRI HEAD WITHOUT CONTRAST MRI CERVICAL SPINE WITHOUT CONTRAST TECHNIQUE: Multiplanar, multiecho pulse sequences of the brain and surrounding structures, and cervical spine, to include the  craniocervical junction and cervicothoracic junction, were obtained without intravenous contrast. COMPARISON:  Prior CT from 02/26/2017 as well as previous MRI of the cervical spine from 05/01/2011. FINDINGS: MRI HEAD FINDINGS Brain: Study degraded by motion artifact. Mild diffuse prominence of the CSF containing spaces compatible with generalized age-related cerebral atrophy. Patchy and confluent T2/FLAIR hyperintensity within the periventricular and deep white matter both cerebral hemispheres most consistent with chronic small vessel ischemic disease, mild to moderate nature. No abnormal foci of restricted diffusion to suggest acute or subacute ischemia. Gray-white matter differentiation maintained. No encephalomalacia to suggest chronic infarction. No evidence for acute or chronic intracranial hemorrhage. No mass lesion, midline shift or mass effect. No hydrocephalus. No extra-axial fluid collection. Major dural sinuses are grossly patent. Pituitary gland suprasellar region within normal limits. Midline structures intact and normal. Vascular: Major intracranial vascular flow voids are maintained. Skull and upper cervical spine: Craniocervical junction within normal limits. Bone marrow signal intensity normal. No scalp soft tissue abnormality. Sinuses/Orbits: Visualized globes and orbital soft tissues within normal limits. Paranasal sinuses are clear. Trace opacity within the bilateral mastoid air cells, of doubtful significance. MRI CERVICAL SPINE FINDINGS Alignment: Study severely limited by a motion artifact. 4 mm anterolisthesis of C4 on C5, with trace retrolisthesis of C5 on C6. Straightening of the normal cervical lordosis. Alignment is stable from previous. Vertebrae: Vertebral body heights maintained. No evidence for acute or chronic fracture. Benign hemangioma noted within the C7 vertebral body. No worrisome osseous lesions. Mild reactive endplate changes present about the posterior C5-6 interspace. Cord:  Evaluation of the cervical spinal cord limited on this motion degraded exam. No obvious cord signal abnormality. Posterior Fossa, vertebral arteries, paraspinal tissues: Craniocervical junction normal. There is apparent edema within knee right greater the left posterior paraspinous musculature (series 3, image 2, 12), of uncertain etiology or significance. This extends from the skullbase/suboccipital region through the T1-2 level  on the right, and to approximately C5-6 on the left. No abnormal prevertebral edema. No other obvious abnormality within knee paraspinous soft tissues on this motion degraded exam. Disc levels: C2-C3: Mild disc bulge. Moderate facet hypertrophy. No significant spinal stenosis. Severe right with mild left C3 foraminal stenosis grossly stable. C3-C4: Degenerative disc bulge with probable small central disc protrusion, grossly similar to previous. Moderate bilateral facet hypertrophy, greater on the left. Grossly stable mild spinal stenosis. Moderate to severe bilateral C4 foraminal stenosis. C4-C5: Moderate degenerative circumferential disc osteophyte complex. Severe left with moderate 5-6 mm in AP diameter, grossly similar to previous. Severe bilateral C5 foraminal stenosis. C5-C6: Chronic circumferential disc osteophyte complex. Severe facet hypertrophy. Resultant severe canal stenosis with flattening of the cervical spinal cord. No obvious cord signal changes. Thecal sac measures approximately 5-6 mm in AP diameter. Severe bilateral C6 foraminal stenosis. C6-C7: Left eccentric disc osteophyte complex. Moderate facet hypertrophy. Moderate spinal stenosis. Severe left with moderate right C7 foraminal narrowing, grossly similar. C7-T1: Degenerative disc bulge. Facet hypertrophy. No significant canal stenosis. Grossly similar moderate bilateral C8 foraminal narrowing. Visualized upper thoracic spine demonstrates degenerative disc bulging at T2-3, T3-4, T4-5 without significant stenosis.  IMPRESSION: MRI HEAD IMPRESSION: 1. No acute intracranial process identified. 2. Generalized age-related cerebral atrophy with mild to moderate chronic small vessel ischemic disease. MRI CERVICAL SPINE IMPRESSION: 1. Severely limited study due to motion artifact. 2. Edema within the posterior paraspinous musculature of the neck, right slightly greater than left. Finding is nonspecific, and of uncertain clinical significance. Possible muscular injury/strain could have this appearance. Infection/myositis could be considered, although there is no other findings to suggest acute infection within the underlying cervical spine itself. Correlation with symptomatology, history, and laboratory values recommended. 3. Severe multilevel degenerative spondylolysis extending from C3-4 through C6-7, grossly similar relative to prior MRI from 05/01/2011. Moderate to severe canal stenosis at these levels, greatest at C4-5 and C5-6. No obvious cord signal abnormality. Multifactorial severe neural foraminal narrowing on the right at C3, bilateral C5, bilateral C6, and on the left at C7. Electronically Signed   By: Rise Mu M.D.   On: 02/27/2017 22:46   Mr Lumbar Spine Wo Contrast  Result Date: 02/20/2017 CLINICAL DATA:  Fall, buttock, RIGHT hip and leg pain. Symptoms began in October last year. History of neuropathy. EXAM: MRI LUMBAR SPINE WITHOUT CONTRAST TECHNIQUE: Multiplanar, multisequence MR imaging of the lumbar spine was performed. No intravenous contrast was administered. COMPARISON:  CT lumbar spine February 19, 2017 and MRI of lumbar spine August 23, 2016 FINDINGS: Moderately motion degraded examination. SEGMENTATION: For the purposes of this report, the last well-formed intervertebral disc will be described as L5-S1. ALIGNMENT: Maintenance of the lumbar lordosis. Minimal grade 1 L4-5 anterolisthesis without spondylolysis. VERTEBRAE:Vertebral bodies are intact. Severe thoracic and, L1-2 thru L3-4 disc height  loss, moderate L4-5 and L5-S1. Disc height loss is similar with T2 bright disc edema all levels, vacuum disc on prior CT could not system with degenerative, benign signal. Severe acute on chronic discogenic endplate changes T12-L1, L1-2. Moderate chronic discogenic endplate changes L2-3 through L5-S1. CONUS MEDULLARIS: Motion obscures the spinal cord, conus medullaris not identified or characterized. Limited assessment of cauda equina. PARASPINAL AND SOFT TISSUES: Included prevertebral and paraspinal soft tissues are nonacute. Mild symmetric paraspinal muscle atrophy. DISC LEVELS: T12-L1 Moderate central disc extrusion with superior migration, bright STIR signal suggesting acute process. Moderate broad-based disc bulge. Moderate to severe facet arthropathy and ligamentum flavum redundancy. Moderate to severe canal stenosis. Mild  RIGHT, moderate LEFT neural foraminal narrowing. L1-2: Moderate broad-based disc bulge, moderate facet arthropathy and ligamentum flavum redundancy. Moderate to severe canal stenosis slightly worse than prior examination. Moderate to severe RIGHT, moderate LEFT neural foraminal narrowing. L2-3: Moderate broad-based disc bulge asymmetric to the RIGHT may affect the exited RIGHT L3 nerve. Moderate facet arthropathy and ligamentum flavum redundancy. Moderate to severe canal stenosis. Moderate bilateral neural foraminal narrowing. L3-4: Small broad-based disc bulge, endplate spurring. Moderate facet arthropathy and ligamentum flavum redundancy. Moderate to severe canal stenosis is similar to worse. Moderate bilateral neural foraminal narrowing. L4-5 anterolisthesis. Small broad-based disc bulge, severe facet arthropathy and ligamentum flavum redundancy with trace facet effusions are which are likely reactive. Moderate canal stenosis. Moderate to severe RIGHT, severe LEFT neural foraminal narrowing. L5-S1: Moderate broad-based disc bulge. Moderate to severe facet arthropathy. No canal stenosis.  Moderate to severe RIGHT, severe LEFT neural foraminal narrowing. IMPRESSION: Moderately motion degraded examination. No acute. Stable minimal grade 1 L4-5 anterolisthesis. New moderate T12-L1 acute appearing disc extrusion. Moderate to severe canal stenosis L1-2 thru L3-4, moderate at L4-5. Neural foraminal narrowing all lumbar levels: Severe on the LEFT at L4-5 and L5-S1. Electronically Signed   By: Awilda Metro M.D.   On: 02/20/2017 19:18   US Renal  Result Date: 02/27/2017 CLINICAL DATA:  Renal failure EXAM: RENAL / URINARY TRACT ULTRASOUND COMPLETE COMPARISON:  MRI 01/17/2011, CT 02/19/2017. FINDINGS: Right Kidney: Length: 9.9 cm. Increased echogenicity consistent with medical renal disease. No hydronephrosis. No perinephric collection. No focal renal parenchymal lesion. Left Kidney: Length: 11.0 cm. Increased echogenicity consistent with medical renal disease. Complex 4 cm cyst, probably unchanged from 01/17/2011 MRI. No hydronephrosis. Bladder: Appears normal for degree of bladder distention. IMPRESSION: Echogenic renal parenchyma consistent with medical renal disease. No hydronephrosis. Complex upper pole left renal cyst is probably unchanged from 2012 although not conclusively characterized. Dedicated renal MRI may be considered for optimal characterization. Electronically Signed   By: Ellery Plunk M.D.   On: 02/27/2017 00:52   Ct Hip Right Wo Contrast  Result Date: 02/19/2017 CLINICAL DATA:  71 year old female with fall and right hip pain. EXAM: CT OF THE RIGHT HIP WITHOUT CONTRAST TECHNIQUE: Multidetector CT imaging of the right hip was performed according to the standard protocol. Multiplanar CT image reconstructions were also generated. COMPARISON:  Radiograph dated 02/19/2017 FINDINGS: Bones/Joint/Cartilage No acute fracture. The bones are osteopenic. No dislocation. No significant degenerative changes. Ligaments Suboptimally assessed by CT. Muscles and Tendons No acute findings.  No  intramuscular hematoma. Soft tissues Mild stranding of the right gluteal subcutaneous fat. IMPRESSION: No acute fracture or dislocation. Electronically Signed   By: Elgie Collard M.D.   On: 02/19/2017 22:09   Dg Hip Unilat W Or Wo Pelvis 2-3 Views Right  Result Date: 02/19/2017 CLINICAL DATA:  71 year old who fell out of bed this morning and complains of low back pain and right hip pain. Initial encounter. EXAM: DG HIP (WITH OR WITHOUT PELVIS) 2-3V RIGHT COMPARISON:  None. FINDINGS: No evidence of acute fracture or dislocation. Well-preserved joint space. Well-preserved bone mineral density for age. Small benign bone island involving the right femoral head. Included AP pelvis demonstrates a normal-appearing contralateral left hip joint. Sacroiliac joints and symphysis pubis intact. Degenerative changes involving the visualized lower lumbar spine. IMPRESSION: No acute osseous abnormality. Electronically Signed   By: Hulan Saas M.D.   On: 02/19/2017 19:10    Microbiology: Recent Results (from the past 240 hour(s))  MRSA PCR Screening  Status: None   Collection Time: 03/01/17  5:42 PM  Result Value Ref Range Status   MRSA by PCR NEGATIVE NEGATIVE Final    Comment:        The GeneXpert MRSA Assay (FDA approved for NASAL specimens only), is one component of a comprehensive MRSA colonization surveillance program. It is not intended to diagnose MRSA infection nor to guide or monitor treatment for MRSA infections.      Labs: Basic Metabolic Panel:  Recent Labs Lab 02/26/17 2053 02/27/17 0459 02/28/17 0448 02/28/17 1037  NA 129* 133*  --  138  K 4.8 4.4  --  3.9  CL 93* 97*  --  103  CO2 24 24  --  25  GLUCOSE 128* 107*  --  173*  BUN 115* 108*  --  51*  CREATININE 3.46* 2.79*  --  0.93  CALCIUM 8.7* 8.8*  --  9.3  MG  --  2.4 2.0  --   PHOS  --  6.1*  --   --    Liver Function Tests:  Recent Labs Lab 02/26/17 2053 02/27/17 0459  AST 60* 52*  ALT 50 50   ALKPHOS 86 90  BILITOT 0.4 0.5  PROT 6.6 6.7  ALBUMIN 2.8* 2.8*   No results for input(s): LIPASE, AMYLASE in the last 168 hours.  Recent Labs Lab 02/26/17 2227 02/27/17 0459  AMMONIA 56* 11   CBC:  Recent Labs Lab 02/26/17 2053 02/27/17 0459  WBC 10.3 9.5  NEUTROABS 8.8*  --   HGB 9.7* 10.1*  HCT 28.0* 29.0*  MCV 85.1 85.0  PLT 283 273   Cardiac Enzymes: No results for input(s): CKTOTAL, CKMB, CKMBINDEX, TROPONINI in the last 168 hours. BNP: BNP (last 3 results) No results for input(s): BNP in the last 8760 hours.  ProBNP (last 3 results) No results for input(s): PROBNP in the last 8760 hours.  CBG:  Recent Labs Lab 02/26/17 1928  GLUCAP 133*       Signed:  Rhetta Mura MD   [facility] 03/02/2017, 2:51 PM

## 2017-03-02 NOTE — Progress Notes (Signed)
MD, SW and CM, spoke with pt, husband and son at bedside concerning discharge plans. Husband and son are very, very adamant of patient not going home. Both stated, "She can not go home, there is no way we can take care of her. She can't come to the house." Explained to them that pt's bill is $5,354.34/day at present time. Husband states pt will go back to SNF.

## 2017-03-02 NOTE — Progress Notes (Signed)
Spoke with pt's husband concerning discharge plans. Husband Melvia Heaps is not coming home. What do you want me to do with her." I will be there between 1:00 and 2:00 and I will talk then."

## 2017-03-02 NOTE — Progress Notes (Addendum)
CM to follow with CSW.

## 2017-03-02 NOTE — Consult Note (Addendum)
   [facility] CM Inpatient Consult   03/02/2017  Theresa Flowers 01-May-1946 [id number]    Late entry for 03/01/17 at 1703 pm.  Received call from Harriett Sine with [employer] stating patient is custodial and SNF can not be authorized.   Called [name] to make aware of conversation with insurance company. [name] states he plans on going to [facility] on Friday morning, 03/02/17 to discuss out of pocket expenses.  Left voicemail for inpatient LCSW, Meghan to make aware of above.  Also made covering [facility] LCSW aware of above as well.  Will continue to follow.   [name], MSN-Ed, RN,BSN [facility] Liaison [phone]

## 2017-03-03 DIAGNOSIS — G629 Polyneuropathy, unspecified: Secondary | ICD-10-CM | POA: Diagnosis not present

## 2017-03-03 DIAGNOSIS — N178 Other acute kidney failure: Secondary | ICD-10-CM | POA: Diagnosis not present

## 2017-03-03 DIAGNOSIS — N319 Neuromuscular dysfunction of bladder, unspecified: Secondary | ICD-10-CM | POA: Diagnosis not present

## 2017-03-03 DIAGNOSIS — I10 Essential (primary) hypertension: Secondary | ICD-10-CM | POA: Diagnosis not present

## 2017-03-05 ENCOUNTER — Other Ambulatory Visit: Payer: Self-pay | Admitting: *Deleted

## 2017-03-05 DIAGNOSIS — G629 Polyneuropathy, unspecified: Secondary | ICD-10-CM | POA: Diagnosis not present

## 2017-03-05 DIAGNOSIS — N179 Acute kidney failure, unspecified: Secondary | ICD-10-CM | POA: Diagnosis not present

## 2017-03-05 DIAGNOSIS — R278 Other lack of coordination: Secondary | ICD-10-CM | POA: Diagnosis not present

## 2017-03-05 DIAGNOSIS — M6281 Muscle weakness (generalized): Secondary | ICD-10-CM | POA: Diagnosis not present

## 2017-03-05 NOTE — Patient Outreach (Addendum)
Received telephone call from Mr. Theresa Flowers. He indicates he needs assistance in applying for Medicaid for his wife. Mr. Theresa Flowers states his wife is now at [facility] for 2 weeks as private pay and will be discharged to home on 03/19/17.   Mr. Theresa Flowers requests that [facility] Care Management LCSW call him back regarding how to apply for longterm Medicaid. Also advised Mr.Theresa Flowers to follow up with the social worker at the facility as well to inquire on the most expedited way of applying for longterm Medicaid since Mr. Theresa Flowers states this his is priority right now.  Made Mr. Theresa Flowers aware that writer will pass all of the above information along to [facility].  Mr. Theresa Flowers contact numbers are home [phone]; cell [phone].    Theresa Noble, MSN-Ed, RN,BSN [facility] Liaison [phone]

## 2017-03-08 ENCOUNTER — Other Ambulatory Visit: Payer: Self-pay | Admitting: Licensed Clinical Social Worker

## 2017-03-08 NOTE — Patient Outreach (Signed)
[facility]) Care Management  03/08/2017  DAVINE SWENEY 1946/02/17 [id number]  Assessment- CSW received new referral on patient from CSW Danford Bad on 03/08/17. Patient is at [facility] and is in need of social work support. CSW completed initial outreach to patient's spouse on 03/08/17 and he answered. HIPPA verifications provided. CSW introduced self, reason for call and of [facility] Care Management Services. Spouse reports that patient DOES NOT wish to stay at facility long term and that family no longer needs assistance with applying for LTC Medicaid. Spouse shares "We learned that we would basically be signing all of her rights away if we did Medicaid. We don't want that." Spouse states that patient wishes to stay at home for as long as possible. However, patient will need personal care assistance once she returns home. CSW attempted to schedule SNF visit for next week and spouse reports that he would prefer an earlier visit. SNF visit scheduled for 03/09/17.  Plan-CSW will send involvement letter to PCP.   [name], BSW, MSW, LCSW [email or web address] Phone: [phone] Fax: [phone]

## 2017-03-09 ENCOUNTER — Ambulatory Visit: Payer: PPO | Admitting: *Deleted

## 2017-03-09 ENCOUNTER — Other Ambulatory Visit: Payer: Self-pay | Admitting: Licensed Clinical Social Worker

## 2017-03-09 NOTE — Patient Outreach (Addendum)
[facility]) Care Management  [facility] Social Work  03/09/2017  Theresa Flowers 1945/12/22 [id number]  Encounter Medications:  Outpatient Encounter Prescriptions as of 03/09/2017  Medication Sig  . acetaminophen (TYLENOL) 500 MG tablet Take 1,000 mg by mouth 3 (three) times daily.  . Amino Acids-Protein Hydrolys (FEEDING SUPPLEMENT, PRO-STAT SUGAR FREE 64,) LIQD Take 30 mLs by mouth 2 (two) times daily.  Marland Kitchen amLODipine (NORVASC) 5 MG tablet Take 1 tablet (5 mg total) by mouth daily.  . carbamazepine (TEGRETOL) 200 MG tablet Take 200 mg by mouth 3 (three) times daily.  . Cholecalciferol (VITAMIN D3 PO) Take 1 tablet by mouth daily.  Marland Kitchen CRANBERRY PO Take 1 tablet by mouth daily.  . diclofenac (FLECTOR) 1.3 % PTCH Place 1 patch onto the skin 2 (two) times daily.  . DULoxetine (CYMBALTA) 60 MG capsule Take 60 mg by mouth 2 (two) times daily.  Marland Kitchen gabapentin (NEURONTIN) 400 MG capsule Take 1 capsule (400 mg total) by mouth 3 (three) times daily.  Marland Kitchen HYDROmorphone (DILAUDID) 2 MG tablet Take 1 tablet (2 mg total) by mouth every 6 (six) hours as needed for severe pain.  . methocarbamol (ROBAXIN) 750 MG tablet Take 1 tablet (750 mg total) by mouth 3 (three) times daily.  . Multiple Vitamin (MULTIVITAMIN WITH MINERALS) TABS tablet Take 1 tablet by mouth daily.  . Omega-3 Fatty Acids (FISH OIL PO) Take 1 capsule by mouth 2 (two) times daily.  . polyethylene glycol (MIRALAX / GLYCOLAX) packet Take 17 g by mouth daily as needed for mild constipation.  . ranitidine (ZANTAC) 75 MG tablet Take 75 mg by mouth 2 (two) times daily.   No facility-administered encounter medications on file as of 03/09/2017.     Functional Status:  In your present state of health, do you have any difficulty performing the following activities: 02/27/2017 02/20/2017  Hearing? N N  Vision? N N  Difficulty concentrating or making decisions? N N  Walking or climbing stairs? Y Y  Dressing or bathing? N N  Doing errands,  shopping? [name]  Some recent data might be hidden    Fall/Depression Screening:  PHQ 2/9 Scores 03/09/2017  PHQ - 2 Score 0    Assessment: CSW completed SNF visit at [facility] with patient, spouse and son on 03/09/17. Patient owns her home and entire family lives together. Patient is currently using wheelchair and is unable to ambulate. Patient is currently paying out of pocket at [facility] for 2 weeks and is in need of community resource information to ensure a safe and stable discharge back home. Patient reports "I told my family that I'm giving it my all and putting in a lot of effort during PT and OT so I can get better." Patient reports that she stood for a total of 10 minutes yesterday which was a major success. She reports that they are working on her transfers currently. Patient reports that her and her spouse's combined income is $2,700 per month and that patient would not qualify for full Adult Medicaid. Therefore, patient is in need of other community resources and personal care services to help support patient once she gets home. Patient's son is able to help but has back problems and is on disability. Patient's spouse is wheelchair bound as well. Family confirm that their home has steps and that patient will need to stay on bottom level of home. Son and patient have discussed possibly selling their house in order to find a home that  is accomodates patient's needs. However, spouse wishes to keep home. They report that they are currently working on making that area safe and had a family friend come by the house (who has worked on their home before) to look at doing additional work ensure safety. Patient reports that she will be able to dress herself after discharge but will need assistance with bathing. Patient shares that her appetite has been "fairly low" recently but with each day it varies. She admits that she has been having difficulty with sleep. Patient reports that she is excited to return  home to her comfort level and family. Patient shares that her support system includes: family, friends and church members. Patient reports that her goal is to eventually be able to go back to [place] at the [facility] that is close her residence. Patient shares that she has a HCPOA and does not wish to make any changes. Patient denies any transportation issues and states that her son and spouse will transport her to her medical appointments after discharge. Patient denies any depressive symptoms stating "It takes a lot to get me down."   CSW provided family with a list of private pay caregivers and reviewed resource with them. Family is considering finding an aide that is not attached to an agency so that the price can be more affordable. Family has a few individuals in mind. CSW provided family with In Home Aide Service handout and informed family that the current wait list is up to 2 years but that program is free. Family wishes for CSW to put patient on wait list. CSW provided information on life alerts but family does not wish to purchase one at this time. CSW provided [employer] and reviewed document (emphasis over socialization opportunities.) CSW provided caregiver support group resources and reviewed with family as well.   CSW went to SNF social worker's office and met with her. She reports that patient's discharge date is 03/19/17 and that she will be placing several orders and request for medical equipment as family has barely any medical equipment at their residence.  CSW completed call to on call caseworker with DSS to make referral to [facility] CSW unable to reach anyone to make referral but left a HIPPA compliant voice message encouraging a return call.   [facility] CM Care Plan Problem One     Most Recent Value  Care Plan Problem One  SNF admission  Role Documenting the Problem One  Clinical Social Worker  Care Plan for Problem One  Active  [facility] Long Term Goal   Patient  will have a safe and stable discharge back home from SNF within 90 days  [facility] Long Term Goal Start Date  03/08/17  Interventions for Problem One Long Term Goal  CSW will complete SNF visit with family. CSW will coordinate care with family and SNF social worker to ensure that patient has all needed resources, services and medical equipment when she discharges back home from SNF.  [facility] CM Short Term Goal #1   Family will work on finding an aide and making home safe over the next two weeks to ensure safety upon patient's return home  [facility] CM Short Term Goal #1 Start Date  03/09/17  Interventions for Short Term Goal #1  CSW has completed SNF visit with family and has provided community resource information. Family is creating plan for discharge and working on home to eliminate any potential dangers and falls.     Plan:  CSW will route encounter to PCP. CSW will follow up with SNF within one week.  Eula Fried, BSW, MSW, [email or web address] Phone: [phone] Fax: [phone]

## 2017-03-11 DIAGNOSIS — M545 Low back pain: Secondary | ICD-10-CM | POA: Diagnosis not present

## 2017-03-13 ENCOUNTER — Ambulatory Visit: Payer: PPO | Admitting: *Deleted

## 2017-03-15 ENCOUNTER — Other Ambulatory Visit: Payer: PPO | Admitting: Licensed Clinical Social Worker

## 2017-03-15 DIAGNOSIS — M5137 Other intervertebral disc degeneration, lumbosacral region: Secondary | ICD-10-CM | POA: Diagnosis not present

## 2017-03-15 DIAGNOSIS — M48062 Spinal stenosis, lumbar region with neurogenic claudication: Secondary | ICD-10-CM | POA: Diagnosis not present

## 2017-03-16 ENCOUNTER — Other Ambulatory Visit: Payer: Self-pay | Admitting: Licensed Clinical Social Worker

## 2017-03-16 DIAGNOSIS — R2689 Other abnormalities of gait and mobility: Secondary | ICD-10-CM | POA: Diagnosis not present

## 2017-03-16 DIAGNOSIS — M5136 Other intervertebral disc degeneration, lumbar region: Secondary | ICD-10-CM | POA: Diagnosis not present

## 2017-03-16 DIAGNOSIS — M6281 Muscle weakness (generalized): Secondary | ICD-10-CM | POA: Diagnosis not present

## 2017-03-16 DIAGNOSIS — G629 Polyneuropathy, unspecified: Secondary | ICD-10-CM | POA: Diagnosis not present

## 2017-03-16 DIAGNOSIS — R269 Unspecified abnormalities of gait and mobility: Secondary | ICD-10-CM | POA: Diagnosis not present

## 2017-03-16 NOTE — Patient Outreach (Signed)
[facility]) Care Management  03/16/2017  ASSYRIA MORREALE 26-Mar-1946 [id number]  Assessment- CSW completed outreach call to [facility] SNF. CSW was unable to reach SNF social worker in order to gain discharge updates. Patient's expected discharge is set for 03/19/17. CSW will follow up with SNF and family within one week.  Plan-CSW will continue to provide social work support. CSW will await to hear back from SNF or complete additional outreach.  [name], BSW, MSW, LCSW [facility] [email or web address] Phone: [phone] Fax: [phone]

## 2017-03-19 ENCOUNTER — Other Ambulatory Visit: Payer: Self-pay | Admitting: Licensed Clinical Social Worker

## 2017-03-19 ENCOUNTER — Other Ambulatory Visit: Payer: Self-pay | Admitting: *Deleted

## 2017-03-19 DIAGNOSIS — I10 Essential (primary) hypertension: Secondary | ICD-10-CM

## 2017-03-19 NOTE — Patient Outreach (Signed)
[facility]) Care Management  03/19/2017  MAILEE KLAAS [birth year] [id number]  Assessment- CSW completed call to [facility] SNF in order to gain discharge updates. Patient will be discharging back home today. CSW unable to reach SNF social worker but left a HIPPA compliant voice message encouraging return call with updates. CSW made referral to [facility] Nurse.  Plan-CSW will await for return call from SNF. CSW will follow up with family within 48 hours.  [name], BSW, MSW, LCSW [facility] [email or web address] Phone: [phone] Fax: [phone]

## 2017-03-20 ENCOUNTER — Other Ambulatory Visit: Payer: Self-pay | Admitting: Licensed Clinical Social Worker

## 2017-03-20 DIAGNOSIS — L89150 Pressure ulcer of sacral region, unstageable: Secondary | ICD-10-CM | POA: Diagnosis not present

## 2017-03-20 DIAGNOSIS — M5136 Other intervertebral disc degeneration, lumbar region: Secondary | ICD-10-CM | POA: Diagnosis not present

## 2017-03-20 DIAGNOSIS — Z466 Encounter for fitting and adjustment of urinary device: Secondary | ICD-10-CM | POA: Diagnosis not present

## 2017-03-20 DIAGNOSIS — I10 Essential (primary) hypertension: Secondary | ICD-10-CM | POA: Diagnosis not present

## 2017-03-20 DIAGNOSIS — G609 Hereditary and idiopathic neuropathy, unspecified: Secondary | ICD-10-CM | POA: Diagnosis not present

## 2017-03-20 NOTE — Patient Outreach (Signed)
[facility]) Care Management  03/20/2017  BRAXTYN DORFF Feb 18, 1946 [id number]   Assessment- CSW completed call to family and spouse answered. He reports that patient safely returned back home yesterday. He shares that patient is "feeling good." However, he shares that patient is still having incontinence issues. CSW questioned if they bought incontinence supplies but spouse shares that a nurse just arrived and he would need to get off the phone.  CSW received return call from patient's spouse and was able to schedule home visit for tomorrow.  Plan-CSW will complete home visit tomorrow on 03/21/17.  [name], BSW, MSW, LCSW [facility] [email or web address] Phone: [phone] Fax: [phone]

## 2017-03-20 NOTE — Patient Outreach (Signed)
[facility]) Care Management  03/19/2017  Theresa Flowers 11/05/1945 [id number]   RN received a referral post discharge from SNF on today. Pt introduced the [facility] service and the purpose for today's call. Pt explains her recent stay at the facility and pending services with the [facility] agency for PT and nursing ([facility]). States she is awaiting a call back to arrange a scheduled appointment. RN completed the transition of care template however pt states she has not arranged an appointment with her primary provider for a post-op hospital visit. Reports she her neuro-surgeon and neurologist is following her care due to no feeling to her hands and lower extreities. 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 Pt also states she currently has an ulcer to her sacrum area and her provider to aware. No treatment mentioned however RN has informed pt to relay this information to the involved [facility] agency if additional interventions (pt receptive). Due to the above issues RN offered a home visit for further engagement however pt feels she does not have an issues that [facility] can address at this time for home visits but receptive to ongoing transition of care calls. RN offered to refer pt to a telephonic disease management nurse for ongoing transition of care contacts (pt receptive). Will close this RN from the care-team. No other inquires or request at this time. Note social work remains actively involved.  Patient was recently discharged from hospital and all medications have been reviewed.  Elliot Cousin, RN Care Management Coordinator [facility] Main Office [phone]

## 2017-03-21 ENCOUNTER — Other Ambulatory Visit: Payer: Self-pay | Admitting: Licensed Clinical Social Worker

## 2017-03-21 DIAGNOSIS — L89150 Pressure ulcer of sacral region, unstageable: Secondary | ICD-10-CM | POA: Diagnosis not present

## 2017-03-21 DIAGNOSIS — M5136 Other intervertebral disc degeneration, lumbar region: Secondary | ICD-10-CM | POA: Diagnosis not present

## 2017-03-21 DIAGNOSIS — G609 Hereditary and idiopathic neuropathy, unspecified: Secondary | ICD-10-CM | POA: Diagnosis not present

## 2017-03-21 DIAGNOSIS — Z466 Encounter for fitting and adjustment of urinary device: Secondary | ICD-10-CM | POA: Diagnosis not present

## 2017-03-21 DIAGNOSIS — I10 Essential (primary) hypertension: Secondary | ICD-10-CM | POA: Diagnosis not present

## 2017-03-21 NOTE — Patient Outreach (Addendum)
[facility]) Care Management  [facility] Social Work  03/21/2017  Theresa Flowers June 14, 1946 [id number]  Encounter Medications:  Outpatient Encounter Prescriptions as of 03/21/2017  Medication Sig  . acetaminophen (TYLENOL) 500 MG tablet Take 1,000 mg by mouth 3 (three) times daily.  . Amino Acids-Protein Hydrolys (FEEDING SUPPLEMENT, PRO-STAT SUGAR FREE 64,) LIQD Take 30 mLs by mouth 2 (two) times daily.  Marland Kitchen amLODipine (NORVASC) 5 MG tablet Take 1 tablet (5 mg total) by mouth daily.  . carbamazepine (TEGRETOL) 200 MG tablet Take 200 mg by mouth 3 (three) times daily.  . Cholecalciferol (VITAMIN D3 PO) Take 1 tablet by mouth daily.  Marland Kitchen CRANBERRY PO Take 1 tablet by mouth daily.  . diclofenac (FLECTOR) 1.3 % PTCH Place 1 patch onto the skin 2 (two) times daily.  . DULoxetine (CYMBALTA) 60 MG capsule Take 60 mg by mouth 2 (two) times daily.  Marland Kitchen gabapentin (NEURONTIN) 400 MG capsule Take 1 capsule (400 mg total) by mouth 3 (three) times daily.  Marland Kitchen HYDROmorphone (DILAUDID) 2 MG tablet Take 1 tablet (2 mg total) by mouth every 6 (six) hours as needed for severe pain.  . methocarbamol (ROBAXIN) 750 MG tablet Take 1 tablet (750 mg total) by mouth 3 (three) times daily.  . Multiple Vitamin (MULTIVITAMIN WITH MINERALS) TABS tablet Take 1 tablet by mouth daily.  . Omega-3 Fatty Acids (FISH OIL PO) Take 1 capsule by mouth 2 (two) times daily.  . polyethylene glycol (MIRALAX / GLYCOLAX) packet Take 17 g by mouth daily as needed for mild constipation.  . ranitidine (ZANTAC) 75 MG tablet Take 75 mg by mouth 2 (two) times daily.   No facility-administered encounter medications on file as of 03/21/2017.     Functional Status:  In your present state of health, do you have any difficulty performing the following activities: 02/27/2017 02/20/2017  Hearing? N N  Vision? N N  Difficulty concentrating or making decisions? N N  Walking or climbing stairs? Y Y  Dressing or bathing? N N  Doing  errands, shopping? Theresa Flowers  Some recent data might be hidden    Fall/Depression Screening:  PHQ 2/9 Scores 03/09/2017  PHQ - 2 Score 0    Assessment: CSW completed routine home visit on 03/21/17 with patient and spouse. Patient lives in a split level house and patient has been staying in the lower level part of the home since her return from SNF due to having several steps in the upper level of the home. Patient shares that they found a new wound and that a wound care nurse will be coming to her residence 2-3 times per week to treat it. She shares that she is still not walking at this time and cannot stand up either but that she has been able to move her legs a little more with each day. She shares that she is completing her exercises while lying down multiple times throughout the day as well. Family state that patient's PT will start services today and OT will start tomorrow. Patient reports "bathing is not an issue." She shares that her son fills up her cubby with toiletries and she is able to bathe herself and her spouse assists her with washing her back. Family report that they now have a caregiver named Hospital doctor who is a CNA and is able to come out several days a week to assist patient. Family share that they were able to get her services for a very reasonable price. Family report that  [name] has already been to their residence twice to provide assistance since her return back home. Patient reports that the SNF provided her with a lot of extra supplies such as incontinence diapers which has assisted their transition back home. Family report that their son who also lives at the residence "has been our runner." She reports "He is going down and up the steps to help take care of me." Family express that they are somewhat stressed about upcoming bills that will come their way. Family state that they received several medical supplies from [facility] which include: hospital bed, foyer lift, bedside commode and trapeze.    CSW questioned if they had scheduled PCP appointment yet and they stated that the appointment they currently have is for September so they will need to find an appointment sooner. Patient also needs to schedule appointment with her neurologist. Family is really unable to provide transportation at this time. CSW has already provided family with a list of transportation resources but completed review again. Family is agreeable to [employer] and Fish farm manager. CSW completed call to [employer] and completed referral. Family will use [employer] for first upcoming appointments as it will take longer to gain services with SCAT. CSW completed entire SCAT application and educated family on program and their eligibility process. CSW will fax completed SCAT application to SCAT office within 48 hours.   Family questioned when [facility] RNCM would complete visit. CSW informed family that they declined [facility] RNCM services yesterday. Patient stated "I got confused. I thought it was someone from my insurance." Patient and family are very adamant about wanting a [facility] Community Nurse involved instead of a [facility]. CSW will send in basket message to involved [facility] team.  Plan: CSW will follow up within two weeks. CSW will fax SCAT application within 48 hours.  [facility] CM Care Plan Problem One     Most Recent Value  Care Plan Problem One  SNF admission  Role Documenting the Problem One  Clinical Social Worker  Care Plan for Problem One  Active  [facility] Long Term Goal   Patient will have a safe and stable discharge back home from SNF within 90 days  [facility] Long Term Goal Start Date  03/08/17  Interventions for Problem One Long Term Goal  CSW will complete SNF visit with family. CSW will coordinate care with family and SNF social worker to ensure that patient has all needed resources, services and medical equipment when she discharges back home from SNF.  [facility] CM Short Term Goal #1   Family will work on finding an aide  and making home safe over the next two weeks to ensure safety upon patient's return home  [facility] CM Short Term Goal #1 Start Date  03/09/17  Interventions for Short Term Goal #1  CSW has completed SNF visit with family and has provided community resource information. Family is creating plan for discharge and working on home to eliminate any potential dangers and falls.  [facility] CM Short Term Goal #2   Patient will gain stable transportation through [employer] and [employer] within 30 days  [facility] CM Short Term Goal #2 Start Date  03/21/17  Interventions for Short Term Goal #2  CSW has completed home visit, educated family on transportation resources, provided handout and completed referral to [employer]. CSW completed SCAT application and will fax it within 48 hours and follow up accordingly to ensure that services as gained.      [name],  BSW, MSW, [email or web address] Phone: [phone] Fax: [phone]

## 2017-03-24 DIAGNOSIS — Z466 Encounter for fitting and adjustment of urinary device: Secondary | ICD-10-CM | POA: Diagnosis not present

## 2017-03-24 DIAGNOSIS — L89150 Pressure ulcer of sacral region, unstageable: Secondary | ICD-10-CM | POA: Diagnosis not present

## 2017-03-24 DIAGNOSIS — G609 Hereditary and idiopathic neuropathy, unspecified: Secondary | ICD-10-CM | POA: Diagnosis not present

## 2017-03-24 DIAGNOSIS — I10 Essential (primary) hypertension: Secondary | ICD-10-CM | POA: Diagnosis not present

## 2017-03-24 DIAGNOSIS — M5136 Other intervertebral disc degeneration, lumbar region: Secondary | ICD-10-CM | POA: Diagnosis not present

## 2017-03-26 ENCOUNTER — Other Ambulatory Visit: Payer: Self-pay | Admitting: *Deleted

## 2017-03-26 NOTE — Patient Outreach (Signed)
[facility]) Care Management  03/26/2017  Theresa Flowers February 02, 1946 [id number]  Transition of care  RN spoke with pt's spouse the primary caregiver Theresa Flowers) and introduced [facility] services and again the purpose for today call. Note RN has spoken with the pt earlier last week who refused [facility] Community home visits however social worker requested another call indicating pt may have misunderstood available services. RN returned another call today and further explained [facility] services and offered community home visits based upon pt's needs. Caregiver indicated he needed assist relief services due to getting burned out with his spouse's care. RN explained that resources are available for possible contract but [facility] does not have sitter or available aide services to relief him from this care. Offered to send out information on these available services however caregiver confirmed that the social worker [name] has provided such resources but indicates these services "cost to much". Other options discussed for possible private hire as RN will contact possible resource with pt permission for services. Caregiver indicates [facility] is involved with PT/OT and RN services. RN inquired about pt's wound mentioned last week and caregiver indicated this has been addressed with wound care today.  Caregiver confirms no issues with medications and once again the only need at this time was respite services. Caregiver opt to decline [facility] for community home visits indicating no needs to address with nursing or case management however agreed to ongoing transition of care calls accordingly. Based upon the information above will generate a plan of care for community resources and services.  Elliot Cousin, RN Care Management Coordinator [facility] Main Office [phone]

## 2017-03-27 ENCOUNTER — Other Ambulatory Visit: Payer: Self-pay | Admitting: Licensed Clinical Social Worker

## 2017-03-27 DIAGNOSIS — G609 Hereditary and idiopathic neuropathy, unspecified: Secondary | ICD-10-CM | POA: Diagnosis not present

## 2017-03-27 DIAGNOSIS — Z466 Encounter for fitting and adjustment of urinary device: Secondary | ICD-10-CM | POA: Diagnosis not present

## 2017-03-27 DIAGNOSIS — I10 Essential (primary) hypertension: Secondary | ICD-10-CM | POA: Diagnosis not present

## 2017-03-27 DIAGNOSIS — M5136 Other intervertebral disc degeneration, lumbar region: Secondary | ICD-10-CM | POA: Diagnosis not present

## 2017-03-27 DIAGNOSIS — L89150 Pressure ulcer of sacral region, unstageable: Secondary | ICD-10-CM | POA: Diagnosis not present

## 2017-03-27 NOTE — Patient Outreach (Signed)
[facility]) Care Management  03/27/2017  Theresa Flowers [birth year] [id number]  Assessment- CSW received incoming call from patient's spouse. Spouse went ahead and canceled medical appointment this week and rescheduled it for 04/06/17 as they did not receive confirmation yet for transportation arrangements. CSW will send request to [facility] Care Management Assistant to make adjustments to transportation arrangements.  Plan-CSW will follow up within one week.  [name], BSW, MSW, LCSW [employer] [name] Phone: [phone] Fax: [phone]

## 2017-03-27 NOTE — Patient Outreach (Signed)
[facility]) Care Management  03/27/2017  Theresa Flowers 02-03-1946 [id number]  Assessment- CSW received in basket message from [facility] RNCM. Family has not received document from [employer] yet in order to schedule transportation for upcoming appointment. CSW will assist with transportation at this time. SCAT and Engineer, structural pending. Patient has an appointment with Dr. Newell Coral on 03/29/17 at 11:45. CSW sent transportation request to [facility] Care Management Assistant.  Plan-CSW will await for transportation confirmation from [facility] Care Management Assistant. CSW has updated [facility] RNCM.  Theresa Flowers, BSW, MSW, LCSW [email or web address] Phone: [phone] Fax: [phone]

## 2017-03-28 ENCOUNTER — Other Ambulatory Visit: Payer: Self-pay | Admitting: Licensed Clinical Social Worker

## 2017-03-28 NOTE — Patient Outreach (Signed)
[place] [facility]) Care Management  03/28/2017  Theresa Flowers 06/13/1946 [id number]  Assessment- CSW received incoming voice message from patient's social worker with [place]. She shares that she wishes to collaborate together in order to help patient get her needs met. She reports that she dropped off an appointment for [place] for family to complete. CSW completed call back to her but was unable to reach her. HIPPA compliant voice message was left requesting a return call.   CSW completed call to patient's residence but was unable to reach them. CSW unable to leave a voice message. CSW will follow up within one week to inform them they the transportation arrangements have been successfully adjusted for patient's upcoming appointment on 04/06/17.   Plan-CSW will follow up within one week.  Eula Fried, BSW, MSW, [email or web address] Phone: [phone] Fax: [phone]

## 2017-03-31 DIAGNOSIS — M5136 Other intervertebral disc degeneration, lumbar region: Secondary | ICD-10-CM | POA: Diagnosis not present

## 2017-03-31 DIAGNOSIS — L89150 Pressure ulcer of sacral region, unstageable: Secondary | ICD-10-CM | POA: Diagnosis not present

## 2017-03-31 DIAGNOSIS — I10 Essential (primary) hypertension: Secondary | ICD-10-CM | POA: Diagnosis not present

## 2017-03-31 DIAGNOSIS — G609 Hereditary and idiopathic neuropathy, unspecified: Secondary | ICD-10-CM | POA: Diagnosis not present

## 2017-03-31 DIAGNOSIS — Z466 Encounter for fitting and adjustment of urinary device: Secondary | ICD-10-CM | POA: Diagnosis not present

## 2017-04-02 ENCOUNTER — Other Ambulatory Visit: Payer: Self-pay | Admitting: *Deleted

## 2017-04-02 ENCOUNTER — Other Ambulatory Visit: Payer: Self-pay | Admitting: Licensed Clinical Social Worker

## 2017-04-02 DIAGNOSIS — I10 Essential (primary) hypertension: Secondary | ICD-10-CM | POA: Diagnosis not present

## 2017-04-02 DIAGNOSIS — L89150 Pressure ulcer of sacral region, unstageable: Secondary | ICD-10-CM | POA: Diagnosis not present

## 2017-04-02 DIAGNOSIS — G609 Hereditary and idiopathic neuropathy, unspecified: Secondary | ICD-10-CM | POA: Diagnosis not present

## 2017-04-02 DIAGNOSIS — Z466 Encounter for fitting and adjustment of urinary device: Secondary | ICD-10-CM | POA: Diagnosis not present

## 2017-04-02 DIAGNOSIS — M5136 Other intervertebral disc degeneration, lumbar region: Secondary | ICD-10-CM | POA: Diagnosis not present

## 2017-04-02 NOTE — Patient Outreach (Signed)
[facility]) Care Management  04/02/2017  ALLYE HOYOS May 29, 1946 [id number]   RN spoke with pt's spouse today who indicated pt continue to improve from her weakness and remains with [facility] for PT/OT services. Spouse states they spoke with the referring aide services however decided on another option for their needs.  Spouse denies any issues with the pt and indicated she is in better place with her care then before. States pt pending an appointment with her surgeon on Friday this week and and pt remains apprehensive however overall pt has made improvement. Plan of care reviewed with some goals met. RN will continue to encourage adherence and progress toward her management of care. No other inquires or request at this time as spouse receptive to ongoing transition of care week follow up on pt's possible needs and improvement. Spouse remains aware that RN can still get further involved with home visits if needed however remains receptive to telephone follow up calls at this time only. Will schedule another follow up call next week.  Raina Mina, RN Care Management Coordinator [place] Office [phone]

## 2017-04-02 NOTE — Patient Outreach (Signed)
[facility]) Care Management  04/02/2017  Theresa Flowers 1946-03-16 [id number]  Assessment-CSW completed call to Theresa Flowers with SCAT services. They were unable to be reached but a HIPPA compliant was left with both staff members inquiring about patient's SCAT application status.  Plan-CSW will await for return call from [employer].  [name], BSW, MSW, LCSW [employer] [email or web address] Phone: [phone] Fax: [phone]

## 2017-04-03 ENCOUNTER — Other Ambulatory Visit: Payer: Self-pay | Admitting: Licensed Clinical Social Worker

## 2017-04-03 NOTE — Patient Outreach (Signed)
[facility]) Care Management  04/03/2017  Theresa Flowers [birth year] [id number]  Assessment- CSW completed outreach call to patient's residence and spouse answered. CSW questioned if they had received a confirmation call from [employer] transportation services. Spouse reports that he contacted them and confirmed the appointment and that they will be at their residence on 04/06/17 at 11:15 for pick up. CSW encouraged spouse to contact CSW if he has any issues on Friday in terms of transportation. Spouse appreciative of social work assistance.  Plan-CSW will follow up within two weeks to follow up on transportation needs. CSW has NOT heard back from [employer].   Theresa Flowers, BSW, MSW, LCSW [facility] [email or web address] Phone: [phone] Fax: [phone]

## 2017-04-06 DIAGNOSIS — M48062 Spinal stenosis, lumbar region with neurogenic claudication: Secondary | ICD-10-CM | POA: Diagnosis not present

## 2017-04-06 DIAGNOSIS — M47816 Spondylosis without myelopathy or radiculopathy, lumbar region: Secondary | ICD-10-CM | POA: Diagnosis not present

## 2017-04-06 DIAGNOSIS — G629 Polyneuropathy, unspecified: Secondary | ICD-10-CM | POA: Diagnosis not present

## 2017-04-06 DIAGNOSIS — M5136 Other intervertebral disc degeneration, lumbar region: Secondary | ICD-10-CM | POA: Diagnosis not present

## 2017-04-09 ENCOUNTER — Other Ambulatory Visit: Payer: Self-pay | Admitting: *Deleted

## 2017-04-09 ENCOUNTER — Encounter: Payer: Self-pay | Admitting: *Deleted

## 2017-04-09 DIAGNOSIS — L89150 Pressure ulcer of sacral region, unstageable: Secondary | ICD-10-CM | POA: Diagnosis not present

## 2017-04-09 DIAGNOSIS — G609 Hereditary and idiopathic neuropathy, unspecified: Secondary | ICD-10-CM | POA: Diagnosis not present

## 2017-04-09 DIAGNOSIS — Z466 Encounter for fitting and adjustment of urinary device: Secondary | ICD-10-CM | POA: Diagnosis not present

## 2017-04-09 DIAGNOSIS — I10 Essential (primary) hypertension: Secondary | ICD-10-CM | POA: Diagnosis not present

## 2017-04-09 DIAGNOSIS — M5136 Other intervertebral disc degeneration, lumbar region: Secondary | ICD-10-CM | POA: Diagnosis not present

## 2017-04-09 NOTE — Patient Outreach (Signed)
[facility]) Care Management  04/09/2017  LARHONDA DETTLOFF 1946/01/12 [id number]  Transition of care  RN spoke with the consented spouse Lizzie An) today. Identifiers confirmed as RN inquired on pt's ongoing recovery and involved agencies. Spouse indicates [facility] remains involved and between the visiting nurse and himself they continue to change the sacral dressing that continues to heal. RN inquired on system check as spouse states pt urination "okay" however some issues with constipation but they has incorporated a daily stool softer and currently awaiting results. RN discussed possible suppositories for expediting results if needed OTC. States pt doing well with managing and administering all her daily medications however still several issues with transportation but currently has one agency that is willing to transport pt. States on the last transport pt had to have the Fire Dept to assist in getting the pt from upstairs to the transport services and again to transport pt back to her room/bed up the stairs once again. Pt states he has 3 agencies that he applied for however only the one agency has responded and willing to assist. No follow up from the other agencies.  Encouraged caregiver to follow up accordingly as a backup for the available assistance.  Spouse reported the results of the recent appointment with the surgeon. Caregiver states the surgeon ha indicated that pt is not a candidate for surgery at this time for spinal surgery.   RN continued to offer any other community resources or needed assistance at this time however none needed based upon the initial request. Plan of care and goals reviewed as pt continues to make progress. Caregiver remains receptive to ongoing telephone assessment with transition of care and RN will scheduled the next follow up for next week concerning pt's progress.   Elliot Cousin, RN Care Management Coordinator [facility] Main Office [phone]

## 2017-04-10 ENCOUNTER — Other Ambulatory Visit: Payer: Self-pay | Admitting: Licensed Clinical Social Worker

## 2017-04-10 DIAGNOSIS — L89150 Pressure ulcer of sacral region, unstageable: Secondary | ICD-10-CM | POA: Diagnosis not present

## 2017-04-10 DIAGNOSIS — I10 Essential (primary) hypertension: Secondary | ICD-10-CM | POA: Diagnosis not present

## 2017-04-10 DIAGNOSIS — M5136 Other intervertebral disc degeneration, lumbar region: Secondary | ICD-10-CM | POA: Diagnosis not present

## 2017-04-10 DIAGNOSIS — Z466 Encounter for fitting and adjustment of urinary device: Secondary | ICD-10-CM | POA: Diagnosis not present

## 2017-04-10 DIAGNOSIS — G609 Hereditary and idiopathic neuropathy, unspecified: Secondary | ICD-10-CM | POA: Diagnosis not present

## 2017-04-10 NOTE — Patient Outreach (Signed)
[facility]) Care Management  04/10/2017  Theresa Flowers 16-Apr-1946 [id number]  Assessment- CSW has not heard back from [employer]. CSW completed additional attempt to SCAT office and left messages with Toni Amend and [place] requesting a return call with updates in regards to patient's application status.  Plan-CSW will await for return call from SCAT office or will complete additional attempt if needed.  [name], BSW, MSW, LCSW [employer] [name] Phone: [phone] Fax: [phone]

## 2017-04-12 DIAGNOSIS — I10 Essential (primary) hypertension: Secondary | ICD-10-CM | POA: Diagnosis not present

## 2017-04-12 DIAGNOSIS — G609 Hereditary and idiopathic neuropathy, unspecified: Secondary | ICD-10-CM | POA: Diagnosis not present

## 2017-04-12 DIAGNOSIS — L89150 Pressure ulcer of sacral region, unstageable: Secondary | ICD-10-CM | POA: Diagnosis not present

## 2017-04-12 DIAGNOSIS — Z466 Encounter for fitting and adjustment of urinary device: Secondary | ICD-10-CM | POA: Diagnosis not present

## 2017-04-12 DIAGNOSIS — M5136 Other intervertebral disc degeneration, lumbar region: Secondary | ICD-10-CM | POA: Diagnosis not present

## 2017-04-13 ENCOUNTER — Other Ambulatory Visit: Payer: Self-pay | Admitting: Licensed Clinical Social Worker

## 2017-04-13 NOTE — Patient Outreach (Signed)
[facility]) Care Management  04/13/2017  Theresa Flowers Apr 27, 1946 [id number]  Assessment- CSW sent secure email to [facility] with SCAT services since CSW has been unable to reach her by phone. CSW received return email from [place] stating "Theresa Flowers application has been processed. She has been temporarily per-certified to use SCAT services until September 30,2018 and in order for a determination to be made concerning certification, all applicants are required to come in for an in person interview to complete the elibility process." CSW sent return email questioning for more information in regards to patient's temporary SCAT permit.  CSW completed call to patient's residence and spouse answered. HIPPA verifications provided. He reports that patient is having both good and bad days. He shares that patient continues to receive OT, PT nurse to treat wound. He reports that they are "keeping her very busy." She is doing her exercises but sometimes gets discouraged. CSW questioned how appointment went last week which was arranged and paid for by [facility]. He reports that they were having difficulty getting patient up the stairs and outside and they called EMS. He stated that they were not able to transfer patient because it was not a medical emergency and because they would not be transporting her to a facility. Spouse then contacted the fire department and they were able to assist. However, they were informed that they would not be able to assist again with this. As of now, there are no upcoming medical appointments for patient. CSW will continue to assist patient with gaining stable transportation but they may have to pay out of pocket for a transportation service that will be able to transfer her out of the home. Spouse expressed understanding. CSW provided education on these transportation resources.   Plan-CSW will follow up within one-two weeks to schedule home visit.  [name], BSW,  MSW, LCSW [employer] [email or web address] Phone: [phone] Fax: [phone]

## 2017-04-16 ENCOUNTER — Other Ambulatory Visit: Payer: Self-pay | Admitting: Licensed Clinical Social Worker

## 2017-04-16 DIAGNOSIS — R269 Unspecified abnormalities of gait and mobility: Secondary | ICD-10-CM | POA: Diagnosis not present

## 2017-04-16 DIAGNOSIS — Z9181 History of falling: Secondary | ICD-10-CM | POA: Diagnosis not present

## 2017-04-16 DIAGNOSIS — M5136 Other intervertebral disc degeneration, lumbar region: Secondary | ICD-10-CM | POA: Diagnosis not present

## 2017-04-16 DIAGNOSIS — M6281 Muscle weakness (generalized): Secondary | ICD-10-CM | POA: Diagnosis not present

## 2017-04-16 DIAGNOSIS — L89310 Pressure ulcer of right buttock, unstageable: Secondary | ICD-10-CM | POA: Diagnosis not present

## 2017-04-16 DIAGNOSIS — K219 Gastro-esophageal reflux disease without esophagitis: Secondary | ICD-10-CM | POA: Diagnosis not present

## 2017-04-16 DIAGNOSIS — R2689 Other abnormalities of gait and mobility: Secondary | ICD-10-CM | POA: Diagnosis not present

## 2017-04-16 DIAGNOSIS — G609 Hereditary and idiopathic neuropathy, unspecified: Secondary | ICD-10-CM | POA: Diagnosis not present

## 2017-04-16 DIAGNOSIS — Z466 Encounter for fitting and adjustment of urinary device: Secondary | ICD-10-CM | POA: Diagnosis not present

## 2017-04-16 DIAGNOSIS — L89150 Pressure ulcer of sacral region, unstageable: Secondary | ICD-10-CM | POA: Diagnosis not present

## 2017-04-16 DIAGNOSIS — G629 Polyneuropathy, unspecified: Secondary | ICD-10-CM | POA: Diagnosis not present

## 2017-04-16 DIAGNOSIS — I10 Essential (primary) hypertension: Secondary | ICD-10-CM | POA: Diagnosis not present

## 2017-04-16 NOTE — Patient Outreach (Signed)
[facility]) Care Management  04/16/2017  Theresa Flowers 1946-08-28 [id number]  Assessment- CSW sent another SECURE email to [place] with SCAT services to inquire about patient's temporary access to SCAT services. CSW will continue to assist family with gaining stable transportation.  Plan-CSW willa wait to hear back from SCAT.  [name], BSW, MSW, LCSW [email or web address] Phone: [phone] Fax: [phone]

## 2017-04-17 DIAGNOSIS — K219 Gastro-esophageal reflux disease without esophagitis: Secondary | ICD-10-CM | POA: Diagnosis not present

## 2017-04-17 DIAGNOSIS — Z9181 History of falling: Secondary | ICD-10-CM | POA: Diagnosis not present

## 2017-04-17 DIAGNOSIS — I10 Essential (primary) hypertension: Secondary | ICD-10-CM | POA: Diagnosis not present

## 2017-04-17 DIAGNOSIS — L89310 Pressure ulcer of right buttock, unstageable: Secondary | ICD-10-CM | POA: Diagnosis not present

## 2017-04-17 DIAGNOSIS — M5136 Other intervertebral disc degeneration, lumbar region: Secondary | ICD-10-CM | POA: Diagnosis not present

## 2017-04-17 DIAGNOSIS — Z466 Encounter for fitting and adjustment of urinary device: Secondary | ICD-10-CM | POA: Diagnosis not present

## 2017-04-17 DIAGNOSIS — L89150 Pressure ulcer of sacral region, unstageable: Secondary | ICD-10-CM | POA: Diagnosis not present

## 2017-04-17 DIAGNOSIS — G609 Hereditary and idiopathic neuropathy, unspecified: Secondary | ICD-10-CM | POA: Diagnosis not present

## 2017-04-18 ENCOUNTER — Other Ambulatory Visit: Payer: Self-pay | Admitting: Licensed Clinical Social Worker

## 2017-04-18 ENCOUNTER — Other Ambulatory Visit: Payer: Self-pay | Admitting: *Deleted

## 2017-04-18 NOTE — Patient Outreach (Signed)
[facility]) Care Management  04/18/2017  Theresa Flowers 03-28-1946 [id number]   Assessment- CSW sent secure email to [facility] with SCAT services again as no reply has been received. CSW is having extreme difficulty communicating and coordinating care with SCAT. CSW contacted patient's residence and spouse answered. CSW provided update. CSW scheduled home visit for tomorrow.  Plan-CSW will complete home visit and tomorrow and continue to provide social work assistance.  [name], BSW, MSW, LCSW [email or web address] Phone: [phone] Fax: [phone]

## 2017-04-18 NOTE — Patient Outreach (Signed)
[facility]) Care Management  04/18/2017  Theresa Flowers September 28, 1945 [id number]   RN attempted outreach call today however unsuccessful but able to leave a HIPAA approved voice message requesting a call back. Will inquire further on pt's ongoing management of care via transition of care contact.   Elliot Cousin, RN Care Management Coordinator [facility] Main Office [phone]

## 2017-04-18 NOTE — Patient Outreach (Signed)
[facility]) Care Management  04/18/2017  Theresa Flowers Jan 05, 1946 [id number]   Late Entry note to enter in correct CM start date.  [name], BSW, MSW, LCSW [employer] [name] Phone: [phone] Fax: [phone]

## 2017-04-19 ENCOUNTER — Other Ambulatory Visit: Payer: Self-pay | Admitting: Licensed Clinical Social Worker

## 2017-04-19 NOTE — Patient Outreach (Signed)
Request received from [name], LCSW to mail patient personal care resources.  Information mailed today. 

## 2017-04-19 NOTE — Patient Outreach (Signed)
[place] [facility]) Care Management  [facility] Social Work  04/19/2017  Theresa Flowers 13-Feb-1946 [id number]  Encounter Medications:  Outpatient Encounter Prescriptions as of 04/19/2017  Medication Sig  . acetaminophen (TYLENOL) 500 MG tablet Take 1,000 mg by mouth 3 (three) times daily.  . Amino Acids-Protein Hydrolys (FEEDING SUPPLEMENT, PRO-STAT SUGAR FREE 64,) LIQD Take 30 mLs by mouth 2 (two) times daily.  Marland Kitchen amLODipine (NORVASC) 5 MG tablet Take 1 tablet (5 mg total) by mouth daily.  . carbamazepine (TEGRETOL) 200 MG tablet Take 200 mg by mouth 3 (three) times daily.  . Cholecalciferol (VITAMIN D3 PO) Take 1 tablet by mouth daily.  Marland Kitchen CRANBERRY PO Take 1 tablet by mouth daily.  . diclofenac (FLECTOR) 1.3 % PTCH Place 1 patch onto the skin 2 (two) times daily.  . DULoxetine (CYMBALTA) 60 MG capsule Take 60 mg by mouth 2 (two) times daily.  Marland Kitchen gabapentin (NEURONTIN) 400 MG capsule Take 1 capsule (400 mg total) by mouth 3 (three) times daily.  Marland Kitchen HYDROmorphone (DILAUDID) 2 MG tablet Take 1 tablet (2 mg total) by mouth every 6 (six) hours as needed for severe pain.  . methocarbamol (ROBAXIN) 750 MG tablet Take 1 tablet (750 mg total) by mouth 3 (three) times daily.  . Multiple Vitamin (MULTIVITAMIN WITH MINERALS) TABS tablet Take 1 tablet by mouth daily.  . Omega-3 Fatty Acids (FISH OIL PO) Take 1 capsule by mouth 2 (two) times daily.  . polyethylene glycol (MIRALAX / GLYCOLAX) packet Take 17 g by mouth daily as needed for mild constipation.  . ranitidine (ZANTAC) 75 MG tablet Take 75 mg by mouth 2 (two) times daily.   No facility-administered encounter medications on file as of 04/19/2017.     Functional Status:  In your present state of health, do you have any difficulty performing the following activities: 02/27/2017 02/20/2017  Hearing? N N  Vision? N N  Difficulty concentrating or making decisions? N N  Walking or climbing stairs? Y Y  Dressing or bathing? N N  Doing  errands, shopping? [name]  Some recent data might be hidden    Fall/Depression Screening:  PHQ 2/9 Scores 03/09/2017  PHQ - 2 Score 0    Assessment: CSW completed routine home visit on 04/19/17 with patient and spouse. CSW was informed that family missed call from [facility] with SCAT services yesterday and that they have tried to contact her back a few times but have not heard back. CSW completed call to SCAT and left an additional voice message with SCAT services. Last heard, patient was granted temporary transportation services with SCAT until 06/03/17. However, family was never informed of this. Transportation remains an issue for family as patient has difficulty transferring out of bed and getting up downstair steps. Family confirmed that they received information from [employer] in the mail, sign consent forms and mailed back to them so patient now has stable transportation with program. However, patient will be only be eligible for 1 wheelchair ride per month through agency. Family expressed understanding. [place] social worker also got family linked with [employer]. Family is still interested in gaining SCAT but patient is unable to complete in person interview at this time. CSW provided hard copy of transportation resources to family again and reviewed each one. Family report that they will use [employer] and Senior Whe[employer] until they gain SCAT but that they have no scheduled medical appointments at this time. Family report interest in gaining a portable ramp. They do not wish  to have a permanent one built at this time. Family have found 2 different portable ramps on [place]. Family also discussed with their contractor to see if the window downstairs can be built into a door. Patient reports that she received a good report from both OT and PT this week which has given her confidence. She reports that she was by to sit up and by her bed by herself which. Patient reports interest in eventually relocating and  would like housing resources and senior living options. CSW provided education on housing resources and will mail her a hardcopy of resources discussed during today's visit.   Plan: CSW will await for return call from SCAT. CSW will send request to [place] Management Assistant to mail out housing resources and independent senior living options. CSW will follow up within 3 weeks.  [facility] CM Care Plan Problem One     Most Recent Value  Care Plan Problem One  SNF admission  Role Documenting the Problem One  Clinical Social Worker  Care Plan for Problem One  Active  [facility] Long Term Goal   Patient will have a safe and stable discharge back home from SNF within 90 days  [facility] Long Term Goal Start Date  03/08/17  Interventions for Problem One Long Term Goal  CSW will complete SNF visit with family. CSW will coordinate care with family and SNF social worker to ensure that patient has all needed resources, services and medical equipment when she discharges back home from SNF.  [facility] CM Short Term Goal #1   Family will work on finding an aide and making home safe over the next two weeks to ensure safety upon patient's return home  [employer] CM Short Term Goal #1 Start Date  03/09/17  [facility] CM Short Term Goal #1 Met Date  04/19/17  Interventions for Short Term Goal #1  Goal met.   [facility] CM Short Term Goal #2   Patient will gain stable transportation through SCAT and [employer] within 30 days  [facility] CM Short Term Goal #2 Start Date  04/19/17  Interventions for Short Term Goal #2  Goal will be ongoing. However, patient does have transportation thru [employer] now     [employer], [place], MSW, [email or web address] Phone: [phone] Fax: [phone]

## 2017-04-20 ENCOUNTER — Other Ambulatory Visit: Payer: Self-pay | Admitting: *Deleted

## 2017-04-20 DIAGNOSIS — L89150 Pressure ulcer of sacral region, unstageable: Secondary | ICD-10-CM | POA: Diagnosis not present

## 2017-04-20 DIAGNOSIS — M6281 Muscle weakness (generalized): Secondary | ICD-10-CM | POA: Diagnosis not present

## 2017-04-20 DIAGNOSIS — M48062 Spinal stenosis, lumbar region with neurogenic claudication: Secondary | ICD-10-CM | POA: Diagnosis not present

## 2017-04-20 DIAGNOSIS — K219 Gastro-esophageal reflux disease without esophagitis: Secondary | ICD-10-CM | POA: Diagnosis not present

## 2017-04-20 DIAGNOSIS — M5137 Other intervertebral disc degeneration, lumbosacral region: Secondary | ICD-10-CM | POA: Diagnosis not present

## 2017-04-20 DIAGNOSIS — R2689 Other abnormalities of gait and mobility: Secondary | ICD-10-CM | POA: Diagnosis not present

## 2017-04-20 DIAGNOSIS — M5136 Other intervertebral disc degeneration, lumbar region: Secondary | ICD-10-CM | POA: Diagnosis not present

## 2017-04-20 DIAGNOSIS — R269 Unspecified abnormalities of gait and mobility: Secondary | ICD-10-CM | POA: Diagnosis not present

## 2017-04-20 DIAGNOSIS — Z9181 History of falling: Secondary | ICD-10-CM | POA: Diagnosis not present

## 2017-04-20 DIAGNOSIS — Z466 Encounter for fitting and adjustment of urinary device: Secondary | ICD-10-CM | POA: Diagnosis not present

## 2017-04-20 DIAGNOSIS — L89301 Pressure ulcer of unspecified buttock, stage 1: Secondary | ICD-10-CM | POA: Diagnosis not present

## 2017-04-20 DIAGNOSIS — G609 Hereditary and idiopathic neuropathy, unspecified: Secondary | ICD-10-CM | POA: Diagnosis not present

## 2017-04-20 DIAGNOSIS — G629 Polyneuropathy, unspecified: Secondary | ICD-10-CM | POA: Diagnosis not present

## 2017-04-20 DIAGNOSIS — L89310 Pressure ulcer of right buttock, unstageable: Secondary | ICD-10-CM | POA: Diagnosis not present

## 2017-04-20 DIAGNOSIS — I10 Essential (primary) hypertension: Secondary | ICD-10-CM | POA: Diagnosis not present

## 2017-04-20 NOTE — Patient Outreach (Signed)
[facility]) Care Management  04/20/2017  MICHE LOUGHRIDGE 05-May-1946 [id number]   2nd outreach attempt to week for ongoing transition of care. Will rescheduled another attempt next week. Not able to leave a message via voice message on this attempt.  Elliot Cousin, RN Care Management Coordinator [facility] Main Office [phone]

## 2017-04-23 ENCOUNTER — Other Ambulatory Visit: Payer: Self-pay | Admitting: *Deleted

## 2017-04-23 NOTE — Patient Outreach (Signed)
[facility]) Care Management  04/23/2017  Theresa Flowers 30-Dec-1945 [id number]   RN spoke with the consented spouse and inquired further on pt's ongoing recovery. State pt is doing well with no major complaints. Reports pt is "hanging in there" and continues to have PT/OT twice weekly along with nursing from the [facility] agency.  RN confirms pt adherent with taking all her prescribed medications and attending all appointments with the next appointment in September. RN continue to inquired on available home visits if pt continues to need assistance with managing her medical issues however spouse opt to decline. RN also attempted once again to gather additional information to completed the initial assessment information but spouse has opt to also decline to provide this information. Based upon pt's progress RN offered to complete one additional transition of care contact next month prior to discharging pt from the [facility] program and services.   Elliot Cousin, RN Care Management Coordinator [facility] Main Office [phone]

## 2017-04-24 DIAGNOSIS — L89150 Pressure ulcer of sacral region, unstageable: Secondary | ICD-10-CM | POA: Diagnosis not present

## 2017-04-24 DIAGNOSIS — G609 Hereditary and idiopathic neuropathy, unspecified: Secondary | ICD-10-CM | POA: Diagnosis not present

## 2017-04-24 DIAGNOSIS — Z466 Encounter for fitting and adjustment of urinary device: Secondary | ICD-10-CM | POA: Diagnosis not present

## 2017-04-24 DIAGNOSIS — K219 Gastro-esophageal reflux disease without esophagitis: Secondary | ICD-10-CM | POA: Diagnosis not present

## 2017-04-24 DIAGNOSIS — M5136 Other intervertebral disc degeneration, lumbar region: Secondary | ICD-10-CM | POA: Diagnosis not present

## 2017-04-24 DIAGNOSIS — L89310 Pressure ulcer of right buttock, unstageable: Secondary | ICD-10-CM | POA: Diagnosis not present

## 2017-04-24 DIAGNOSIS — I10 Essential (primary) hypertension: Secondary | ICD-10-CM | POA: Diagnosis not present

## 2017-04-24 DIAGNOSIS — Z9181 History of falling: Secondary | ICD-10-CM | POA: Diagnosis not present

## 2017-04-28 DIAGNOSIS — Z466 Encounter for fitting and adjustment of urinary device: Secondary | ICD-10-CM | POA: Diagnosis not present

## 2017-04-28 DIAGNOSIS — M5136 Other intervertebral disc degeneration, lumbar region: Secondary | ICD-10-CM | POA: Diagnosis not present

## 2017-04-28 DIAGNOSIS — Z9181 History of falling: Secondary | ICD-10-CM | POA: Diagnosis not present

## 2017-04-28 DIAGNOSIS — K219 Gastro-esophageal reflux disease without esophagitis: Secondary | ICD-10-CM | POA: Diagnosis not present

## 2017-04-28 DIAGNOSIS — G609 Hereditary and idiopathic neuropathy, unspecified: Secondary | ICD-10-CM | POA: Diagnosis not present

## 2017-04-28 DIAGNOSIS — L89150 Pressure ulcer of sacral region, unstageable: Secondary | ICD-10-CM | POA: Diagnosis not present

## 2017-04-28 DIAGNOSIS — L89310 Pressure ulcer of right buttock, unstageable: Secondary | ICD-10-CM | POA: Diagnosis not present

## 2017-04-28 DIAGNOSIS — I10 Essential (primary) hypertension: Secondary | ICD-10-CM | POA: Diagnosis not present

## 2017-04-30 ENCOUNTER — Other Ambulatory Visit: Payer: Self-pay | Admitting: Licensed Clinical Social Worker

## 2017-04-30 NOTE — Patient Outreach (Signed)
[facility]) Care Management  04/30/2017  CAITLAND PORCHIA 08/01/1946 [id number]  Assessment- CSW received return call from [place] with SCAT services while CSW was out of the office. She informed CSW that SCAT IS NOT able to complete home visits in person in order to complete the SCAT eligibility process. CSW completed return call to [facility] with SCAT but was unable to reach her. CSW left a HIPPA compliant voice message encouraging a return call.  Plan-CSW will follow up within one week.  [name], BSW, MSW, LCSW [employer] [employer] [email or web address] Phone: [phone] Fax: [phone]

## 2017-05-01 DIAGNOSIS — L89310 Pressure ulcer of right buttock, unstageable: Secondary | ICD-10-CM | POA: Diagnosis not present

## 2017-05-01 DIAGNOSIS — I10 Essential (primary) hypertension: Secondary | ICD-10-CM | POA: Diagnosis not present

## 2017-05-01 DIAGNOSIS — Z9181 History of falling: Secondary | ICD-10-CM | POA: Diagnosis not present

## 2017-05-01 DIAGNOSIS — Z466 Encounter for fitting and adjustment of urinary device: Secondary | ICD-10-CM | POA: Diagnosis not present

## 2017-05-01 DIAGNOSIS — G609 Hereditary and idiopathic neuropathy, unspecified: Secondary | ICD-10-CM | POA: Diagnosis not present

## 2017-05-01 DIAGNOSIS — L89150 Pressure ulcer of sacral region, unstageable: Secondary | ICD-10-CM | POA: Diagnosis not present

## 2017-05-01 DIAGNOSIS — M5136 Other intervertebral disc degeneration, lumbar region: Secondary | ICD-10-CM | POA: Diagnosis not present

## 2017-05-01 DIAGNOSIS — K219 Gastro-esophageal reflux disease without esophagitis: Secondary | ICD-10-CM | POA: Diagnosis not present

## 2017-05-02 ENCOUNTER — Other Ambulatory Visit: Payer: Self-pay | Admitting: Licensed Clinical Social Worker

## 2017-05-02 NOTE — Patient Outreach (Signed)
[facility]) Care Management  05/02/2017  Theresa Flowers December 15, 1945 [id number]  Assessment- CSW received return call from SCAT and was informed that they are not able to do home visits at this time but are willing to hang on to patient's application until she is able to do an in person interview. However, they were concerned that patient's address would not allow patient to be eligible for services. SCAT wanted CSW to check and see what city limits patient resides in and then to let them know.  CSW completed call to patient's residence and spouse answered. CSW provided update. He reports that their mailing address states [place] but that they are in the city limits of [place], [place]. Spouse wanted to double check and CSW sent an email to [place] with SCAT services to confirm that patient would not be eligible for services. CSW will contact family back with update once CSW has heard back from SCAT. However, at this time patient will have to use [employer] for stable transportation to all [employer] appointments and use Senior Whe[employer] for stable transportation to [place] appointments. Spouse expressed understanding and was appreciative of update.  Plan-CSW will await to hear back from SCAT and then update family.  [name], BSW, MSW, LCSW [facility] [email or web address] Phone: [phone] Fax: [phone]

## 2017-05-02 NOTE — Patient Outreach (Signed)
[facility]) Care Management  05/02/2017  Theresa Flowers 11-07-1945 [id number]  Assessment- CSW completed call to [facility] with SCAT services to inquire about patient's application status. CSW was informed that Silvio Pate (who CSW has been communicating with) is no longer employed there. She reports that she will need to confirm that patient's address is in the city limits of [place] before following through with the next steps. She reports that she is going to pull patient's SCAT application and then contact CSW back to follow up.  Plan-CSW will await for return call from SCAT and continue to provide social work assistance.  [name], BSW, MSW, LCSW [employer] [email or web address] Phone: [phone] Fax: [phone]

## 2017-05-04 ENCOUNTER — Other Ambulatory Visit: Payer: Self-pay | Admitting: Licensed Clinical Social Worker

## 2017-05-04 DIAGNOSIS — N390 Urinary tract infection, site not specified: Secondary | ICD-10-CM | POA: Diagnosis not present

## 2017-05-04 NOTE — Patient Outreach (Signed)
[facility]) Care Management  05/04/2017  LUXE CUADROS [birth year] [id number]  Assessment-CSW received update from [place] [employer] with SCAT services and CSW attempted outreach today to family. CSW unable to reach patient successfully. CSW left a HIPPA compliant voice message encouraging patient to return call once available.  Plan-CSW will await return call or complete an additional outreach if needed.  [name], BSW, MSW, LCSW [employer] [email or web address] Phone: [phone] Fax: [phone]

## 2017-05-07 DIAGNOSIS — N390 Urinary tract infection, site not specified: Secondary | ICD-10-CM | POA: Diagnosis not present

## 2017-05-07 DIAGNOSIS — K219 Gastro-esophageal reflux disease without esophagitis: Secondary | ICD-10-CM | POA: Diagnosis not present

## 2017-05-07 DIAGNOSIS — L89310 Pressure ulcer of right buttock, unstageable: Secondary | ICD-10-CM | POA: Diagnosis not present

## 2017-05-07 DIAGNOSIS — L89150 Pressure ulcer of sacral region, unstageable: Secondary | ICD-10-CM | POA: Diagnosis not present

## 2017-05-07 DIAGNOSIS — M5136 Other intervertebral disc degeneration, lumbar region: Secondary | ICD-10-CM | POA: Diagnosis not present

## 2017-05-07 DIAGNOSIS — Z466 Encounter for fitting and adjustment of urinary device: Secondary | ICD-10-CM | POA: Diagnosis not present

## 2017-05-07 DIAGNOSIS — Z9181 History of falling: Secondary | ICD-10-CM | POA: Diagnosis not present

## 2017-05-07 DIAGNOSIS — I10 Essential (primary) hypertension: Secondary | ICD-10-CM | POA: Diagnosis not present

## 2017-05-07 DIAGNOSIS — G609 Hereditary and idiopathic neuropathy, unspecified: Secondary | ICD-10-CM | POA: Diagnosis not present

## 2017-05-08 ENCOUNTER — Other Ambulatory Visit: Payer: Self-pay | Admitting: Licensed Clinical Social Worker

## 2017-05-08 ENCOUNTER — Encounter: Payer: Self-pay | Admitting: Licensed Clinical Social Worker

## 2017-05-08 DIAGNOSIS — Z9181 History of falling: Secondary | ICD-10-CM | POA: Diagnosis not present

## 2017-05-08 DIAGNOSIS — M5136 Other intervertebral disc degeneration, lumbar region: Secondary | ICD-10-CM | POA: Diagnosis not present

## 2017-05-08 DIAGNOSIS — I10 Essential (primary) hypertension: Secondary | ICD-10-CM | POA: Diagnosis not present

## 2017-05-08 DIAGNOSIS — G609 Hereditary and idiopathic neuropathy, unspecified: Secondary | ICD-10-CM | POA: Diagnosis not present

## 2017-05-08 DIAGNOSIS — L89150 Pressure ulcer of sacral region, unstageable: Secondary | ICD-10-CM | POA: Diagnosis not present

## 2017-05-08 DIAGNOSIS — K219 Gastro-esophageal reflux disease without esophagitis: Secondary | ICD-10-CM | POA: Diagnosis not present

## 2017-05-08 DIAGNOSIS — Z466 Encounter for fitting and adjustment of urinary device: Secondary | ICD-10-CM | POA: Diagnosis not present

## 2017-05-08 DIAGNOSIS — L89310 Pressure ulcer of right buttock, unstageable: Secondary | ICD-10-CM | POA: Diagnosis not present

## 2017-05-08 NOTE — Patient Outreach (Signed)
  [facility]) Care Management  05/08/2017  Theresa Flowers 1946/02/21 [id number]   Assessment- CSW completed outreach call to residence and spouse answered. HIPPA verifications were provided. CSW provided update that SCAT confirmed that patient's address is out of their district and that they could only provide services if they were to meet at a location in [place], [place]. Spouse reports that this would not be helpful and that they will continue to use either [employer] for [employer] appointments and [employer] for [place] appointments. CSW provided brief education on these transportation services. Spouse was very appreciative of assistance. Family deny any further social work needs and are agreeable to social work discharge. CSW will close case at this time.  Plan-CSW will complete social work discharge and update [facility] RNCM and PCP.  [name], BSW, MSW, LCSW [employer] [email or web address] Phone: [phone] Fax: [phone]

## 2017-05-14 DIAGNOSIS — G609 Hereditary and idiopathic neuropathy, unspecified: Secondary | ICD-10-CM | POA: Diagnosis not present

## 2017-05-14 DIAGNOSIS — M5136 Other intervertebral disc degeneration, lumbar region: Secondary | ICD-10-CM | POA: Diagnosis not present

## 2017-05-14 DIAGNOSIS — Z466 Encounter for fitting and adjustment of urinary device: Secondary | ICD-10-CM | POA: Diagnosis not present

## 2017-05-14 DIAGNOSIS — L89310 Pressure ulcer of right buttock, unstageable: Secondary | ICD-10-CM | POA: Diagnosis not present

## 2017-05-14 DIAGNOSIS — Z9181 History of falling: Secondary | ICD-10-CM | POA: Diagnosis not present

## 2017-05-14 DIAGNOSIS — K219 Gastro-esophageal reflux disease without esophagitis: Secondary | ICD-10-CM | POA: Diagnosis not present

## 2017-05-14 DIAGNOSIS — L89150 Pressure ulcer of sacral region, unstageable: Secondary | ICD-10-CM | POA: Diagnosis not present

## 2017-05-14 DIAGNOSIS — I10 Essential (primary) hypertension: Secondary | ICD-10-CM | POA: Diagnosis not present

## 2017-05-15 DIAGNOSIS — Z466 Encounter for fitting and adjustment of urinary device: Secondary | ICD-10-CM | POA: Diagnosis not present

## 2017-05-15 DIAGNOSIS — M5136 Other intervertebral disc degeneration, lumbar region: Secondary | ICD-10-CM | POA: Diagnosis not present

## 2017-05-15 DIAGNOSIS — Z9181 History of falling: Secondary | ICD-10-CM | POA: Diagnosis not present

## 2017-05-15 DIAGNOSIS — L89310 Pressure ulcer of right buttock, unstageable: Secondary | ICD-10-CM | POA: Diagnosis not present

## 2017-05-15 DIAGNOSIS — G609 Hereditary and idiopathic neuropathy, unspecified: Secondary | ICD-10-CM | POA: Diagnosis not present

## 2017-05-15 DIAGNOSIS — I10 Essential (primary) hypertension: Secondary | ICD-10-CM | POA: Diagnosis not present

## 2017-05-15 DIAGNOSIS — L89150 Pressure ulcer of sacral region, unstageable: Secondary | ICD-10-CM | POA: Diagnosis not present

## 2017-05-15 DIAGNOSIS — K219 Gastro-esophageal reflux disease without esophagitis: Secondary | ICD-10-CM | POA: Diagnosis not present

## 2017-05-17 DIAGNOSIS — M5136 Other intervertebral disc degeneration, lumbar region: Secondary | ICD-10-CM | POA: Diagnosis not present

## 2017-05-17 DIAGNOSIS — R269 Unspecified abnormalities of gait and mobility: Secondary | ICD-10-CM | POA: Diagnosis not present

## 2017-05-17 DIAGNOSIS — M6281 Muscle weakness (generalized): Secondary | ICD-10-CM | POA: Diagnosis not present

## 2017-05-17 DIAGNOSIS — R2689 Other abnormalities of gait and mobility: Secondary | ICD-10-CM | POA: Diagnosis not present

## 2017-05-17 DIAGNOSIS — G629 Polyneuropathy, unspecified: Secondary | ICD-10-CM | POA: Diagnosis not present

## 2017-05-18 ENCOUNTER — Encounter: Payer: Self-pay | Admitting: *Deleted

## 2017-05-18 ENCOUNTER — Other Ambulatory Visit: Payer: Self-pay | Admitting: *Deleted

## 2017-05-18 NOTE — Patient Outreach (Signed)
[facility]) Care Management  05/18/2017  Theresa Flowers 1945-12-12 [id number]   Last telephonic call post transition of care as RN spoke with the consented spouse who confirmed pt's identifiers and indicated pt is doing well with no needs at this time. No community resources needed at this time and pt continues to do well with her blood pressures, attending all medical appointments and taking all her recommended medications with no needed refills. Plan of care and goals reviewed and met accordingly with no delays and pt continue to recovery with additional needs at this time. Based upon pt's process and the completion of the transition of care contacts. Pt will be discharged accordingly and her primary care provider will be notified.  Raina Mina, RN Care Management Coordinator [place] Office [phone]

## 2017-05-21 DIAGNOSIS — L89310 Pressure ulcer of right buttock, unstageable: Secondary | ICD-10-CM | POA: Diagnosis not present

## 2017-05-21 DIAGNOSIS — I10 Essential (primary) hypertension: Secondary | ICD-10-CM | POA: Diagnosis not present

## 2017-05-21 DIAGNOSIS — G609 Hereditary and idiopathic neuropathy, unspecified: Secondary | ICD-10-CM | POA: Diagnosis not present

## 2017-05-21 DIAGNOSIS — M5137 Other intervertebral disc degeneration, lumbosacral region: Secondary | ICD-10-CM | POA: Diagnosis not present

## 2017-05-21 DIAGNOSIS — M5136 Other intervertebral disc degeneration, lumbar region: Secondary | ICD-10-CM | POA: Diagnosis not present

## 2017-05-21 DIAGNOSIS — R2689 Other abnormalities of gait and mobility: Secondary | ICD-10-CM | POA: Diagnosis not present

## 2017-05-21 DIAGNOSIS — R269 Unspecified abnormalities of gait and mobility: Secondary | ICD-10-CM | POA: Diagnosis not present

## 2017-05-21 DIAGNOSIS — L89301 Pressure ulcer of unspecified buttock, stage 1: Secondary | ICD-10-CM | POA: Diagnosis not present

## 2017-05-21 DIAGNOSIS — L89150 Pressure ulcer of sacral region, unstageable: Secondary | ICD-10-CM | POA: Diagnosis not present

## 2017-05-21 DIAGNOSIS — M48062 Spinal stenosis, lumbar region with neurogenic claudication: Secondary | ICD-10-CM | POA: Diagnosis not present

## 2017-05-21 DIAGNOSIS — Z9181 History of falling: Secondary | ICD-10-CM | POA: Diagnosis not present

## 2017-05-21 DIAGNOSIS — Z466 Encounter for fitting and adjustment of urinary device: Secondary | ICD-10-CM | POA: Diagnosis not present

## 2017-05-21 DIAGNOSIS — G629 Polyneuropathy, unspecified: Secondary | ICD-10-CM | POA: Diagnosis not present

## 2017-05-21 DIAGNOSIS — M6281 Muscle weakness (generalized): Secondary | ICD-10-CM | POA: Diagnosis not present

## 2017-05-21 DIAGNOSIS — K219 Gastro-esophageal reflux disease without esophagitis: Secondary | ICD-10-CM | POA: Diagnosis not present

## 2017-05-22 DIAGNOSIS — I10 Essential (primary) hypertension: Secondary | ICD-10-CM | POA: Diagnosis not present

## 2017-05-22 DIAGNOSIS — K219 Gastro-esophageal reflux disease without esophagitis: Secondary | ICD-10-CM | POA: Diagnosis not present

## 2017-05-22 DIAGNOSIS — M5136 Other intervertebral disc degeneration, lumbar region: Secondary | ICD-10-CM | POA: Diagnosis not present

## 2017-05-22 DIAGNOSIS — Z9181 History of falling: Secondary | ICD-10-CM | POA: Diagnosis not present

## 2017-05-22 DIAGNOSIS — Z466 Encounter for fitting and adjustment of urinary device: Secondary | ICD-10-CM | POA: Diagnosis not present

## 2017-05-22 DIAGNOSIS — L89310 Pressure ulcer of right buttock, unstageable: Secondary | ICD-10-CM | POA: Diagnosis not present

## 2017-05-22 DIAGNOSIS — L89150 Pressure ulcer of sacral region, unstageable: Secondary | ICD-10-CM | POA: Diagnosis not present

## 2017-05-22 DIAGNOSIS — G609 Hereditary and idiopathic neuropathy, unspecified: Secondary | ICD-10-CM | POA: Diagnosis not present

## 2017-05-28 DIAGNOSIS — L89310 Pressure ulcer of right buttock, unstageable: Secondary | ICD-10-CM | POA: Diagnosis not present

## 2017-05-28 DIAGNOSIS — L89150 Pressure ulcer of sacral region, unstageable: Secondary | ICD-10-CM | POA: Diagnosis not present

## 2017-05-28 DIAGNOSIS — I10 Essential (primary) hypertension: Secondary | ICD-10-CM | POA: Diagnosis not present

## 2017-05-28 DIAGNOSIS — G609 Hereditary and idiopathic neuropathy, unspecified: Secondary | ICD-10-CM | POA: Diagnosis not present

## 2017-05-28 DIAGNOSIS — Z466 Encounter for fitting and adjustment of urinary device: Secondary | ICD-10-CM | POA: Diagnosis not present

## 2017-05-28 DIAGNOSIS — M5136 Other intervertebral disc degeneration, lumbar region: Secondary | ICD-10-CM | POA: Diagnosis not present

## 2017-05-28 DIAGNOSIS — Z9181 History of falling: Secondary | ICD-10-CM | POA: Diagnosis not present

## 2017-05-28 DIAGNOSIS — K219 Gastro-esophageal reflux disease without esophagitis: Secondary | ICD-10-CM | POA: Diagnosis not present

## 2017-05-29 DIAGNOSIS — M5136 Other intervertebral disc degeneration, lumbar region: Secondary | ICD-10-CM | POA: Diagnosis not present

## 2017-05-29 DIAGNOSIS — I10 Essential (primary) hypertension: Secondary | ICD-10-CM | POA: Diagnosis not present

## 2017-05-29 DIAGNOSIS — K219 Gastro-esophageal reflux disease without esophagitis: Secondary | ICD-10-CM | POA: Diagnosis not present

## 2017-05-29 DIAGNOSIS — Z466 Encounter for fitting and adjustment of urinary device: Secondary | ICD-10-CM | POA: Diagnosis not present

## 2017-05-29 DIAGNOSIS — G609 Hereditary and idiopathic neuropathy, unspecified: Secondary | ICD-10-CM | POA: Diagnosis not present

## 2017-05-29 DIAGNOSIS — L89310 Pressure ulcer of right buttock, unstageable: Secondary | ICD-10-CM | POA: Diagnosis not present

## 2017-05-29 DIAGNOSIS — L89150 Pressure ulcer of sacral region, unstageable: Secondary | ICD-10-CM | POA: Diagnosis not present

## 2017-05-29 DIAGNOSIS — Z9181 History of falling: Secondary | ICD-10-CM | POA: Diagnosis not present

## 2017-06-04 DIAGNOSIS — Z466 Encounter for fitting and adjustment of urinary device: Secondary | ICD-10-CM | POA: Diagnosis not present

## 2017-06-04 DIAGNOSIS — M5136 Other intervertebral disc degeneration, lumbar region: Secondary | ICD-10-CM | POA: Diagnosis not present

## 2017-06-04 DIAGNOSIS — L89150 Pressure ulcer of sacral region, unstageable: Secondary | ICD-10-CM | POA: Diagnosis not present

## 2017-06-04 DIAGNOSIS — L89310 Pressure ulcer of right buttock, unstageable: Secondary | ICD-10-CM | POA: Diagnosis not present

## 2017-06-04 DIAGNOSIS — Z9181 History of falling: Secondary | ICD-10-CM | POA: Diagnosis not present

## 2017-06-04 DIAGNOSIS — K219 Gastro-esophageal reflux disease without esophagitis: Secondary | ICD-10-CM | POA: Diagnosis not present

## 2017-06-04 DIAGNOSIS — G609 Hereditary and idiopathic neuropathy, unspecified: Secondary | ICD-10-CM | POA: Diagnosis not present

## 2017-06-04 DIAGNOSIS — I10 Essential (primary) hypertension: Secondary | ICD-10-CM | POA: Diagnosis not present

## 2017-06-05 DIAGNOSIS — I10 Essential (primary) hypertension: Secondary | ICD-10-CM | POA: Diagnosis not present

## 2017-06-05 DIAGNOSIS — L89310 Pressure ulcer of right buttock, unstageable: Secondary | ICD-10-CM | POA: Diagnosis not present

## 2017-06-05 DIAGNOSIS — K219 Gastro-esophageal reflux disease without esophagitis: Secondary | ICD-10-CM | POA: Diagnosis not present

## 2017-06-05 DIAGNOSIS — M5136 Other intervertebral disc degeneration, lumbar region: Secondary | ICD-10-CM | POA: Diagnosis not present

## 2017-06-05 DIAGNOSIS — Z9181 History of falling: Secondary | ICD-10-CM | POA: Diagnosis not present

## 2017-06-05 DIAGNOSIS — Z466 Encounter for fitting and adjustment of urinary device: Secondary | ICD-10-CM | POA: Diagnosis not present

## 2017-06-05 DIAGNOSIS — L89150 Pressure ulcer of sacral region, unstageable: Secondary | ICD-10-CM | POA: Diagnosis not present

## 2017-06-05 DIAGNOSIS — G609 Hereditary and idiopathic neuropathy, unspecified: Secondary | ICD-10-CM | POA: Diagnosis not present

## 2017-06-11 DIAGNOSIS — M5136 Other intervertebral disc degeneration, lumbar region: Secondary | ICD-10-CM | POA: Diagnosis not present

## 2017-06-11 DIAGNOSIS — K219 Gastro-esophageal reflux disease without esophagitis: Secondary | ICD-10-CM | POA: Diagnosis not present

## 2017-06-11 DIAGNOSIS — Z466 Encounter for fitting and adjustment of urinary device: Secondary | ICD-10-CM | POA: Diagnosis not present

## 2017-06-11 DIAGNOSIS — I10 Essential (primary) hypertension: Secondary | ICD-10-CM | POA: Diagnosis not present

## 2017-06-11 DIAGNOSIS — G609 Hereditary and idiopathic neuropathy, unspecified: Secondary | ICD-10-CM | POA: Diagnosis not present

## 2017-06-11 DIAGNOSIS — L89310 Pressure ulcer of right buttock, unstageable: Secondary | ICD-10-CM | POA: Diagnosis not present

## 2017-06-11 DIAGNOSIS — Z9181 History of falling: Secondary | ICD-10-CM | POA: Diagnosis not present

## 2017-06-11 DIAGNOSIS — L89150 Pressure ulcer of sacral region, unstageable: Secondary | ICD-10-CM | POA: Diagnosis not present

## 2017-06-13 DIAGNOSIS — D509 Iron deficiency anemia, unspecified: Secondary | ICD-10-CM | POA: Diagnosis not present

## 2017-06-13 DIAGNOSIS — E785 Hyperlipidemia, unspecified: Secondary | ICD-10-CM | POA: Diagnosis not present

## 2017-06-13 DIAGNOSIS — I10 Essential (primary) hypertension: Secondary | ICD-10-CM | POA: Diagnosis not present

## 2017-06-13 DIAGNOSIS — R269 Unspecified abnormalities of gait and mobility: Secondary | ICD-10-CM | POA: Diagnosis not present

## 2017-06-13 DIAGNOSIS — Z23 Encounter for immunization: Secondary | ICD-10-CM | POA: Diagnosis not present

## 2017-06-14 ENCOUNTER — Other Ambulatory Visit: Payer: Self-pay | Admitting: *Deleted

## 2017-06-14 NOTE — Patient Outreach (Signed)
[facility]) Care Management  06/14/2017  Theresa Flowers [birth year] [id number]   RN received a referral today  Initial outreach attempt unsuccessful and RN unable to leave a voice message. Will outreach attempts accordingly.  Elliot Cousin, RN Care Management Coordinator [facility] Main Office [phone]

## 2017-06-15 ENCOUNTER — Other Ambulatory Visit: Payer: Self-pay | Admitting: *Deleted

## 2017-06-15 ENCOUNTER — Other Ambulatory Visit: Payer: PPO | Admitting: *Deleted

## 2017-06-15 ENCOUNTER — Encounter: Payer: Self-pay | Admitting: *Deleted

## 2017-06-15 DIAGNOSIS — I10 Essential (primary) hypertension: Secondary | ICD-10-CM | POA: Diagnosis not present

## 2017-06-15 DIAGNOSIS — G609 Hereditary and idiopathic neuropathy, unspecified: Secondary | ICD-10-CM | POA: Diagnosis not present

## 2017-06-15 DIAGNOSIS — Z466 Encounter for fitting and adjustment of urinary device: Secondary | ICD-10-CM | POA: Diagnosis not present

## 2017-06-15 DIAGNOSIS — Z9181 History of falling: Secondary | ICD-10-CM | POA: Diagnosis not present

## 2017-06-15 DIAGNOSIS — K219 Gastro-esophageal reflux disease without esophagitis: Secondary | ICD-10-CM | POA: Diagnosis not present

## 2017-06-15 DIAGNOSIS — L89310 Pressure ulcer of right buttock, unstageable: Secondary | ICD-10-CM | POA: Diagnosis not present

## 2017-06-15 DIAGNOSIS — M5136 Other intervertebral disc degeneration, lumbar region: Secondary | ICD-10-CM | POA: Diagnosis not present

## 2017-06-15 DIAGNOSIS — L89150 Pressure ulcer of sacral region, unstageable: Secondary | ICD-10-CM | POA: Diagnosis not present

## 2017-06-15 NOTE — Patient Outreach (Signed)
[facility]) Care Management  06/15/2017  JALENE DEMO [birth year] [id number]   RN attempted a 2nd outreach to pt today however only able to leave a HIPAA approved voice message requesting a call back. Will inquire further on this referral at that time. Will rescheduled for another outreach call for Monday.  Elliot Cousin, RN Care Management Coordinator [facility] Main Office [phone]

## 2017-06-15 NOTE — Patient Outreach (Signed)
Acute home visit per [facility] director for evaluation of pt home health needs:  Pt is bed bound at present with trapeze that she is able to use and they have a [place] lift. Husband and son are caregivers but with limited abilities themselves. Pt has polyneuropathy with has worsened since a fall last summer. She has constant low back pain from hip to hip. Her current pain med regimen keeps her pain level to an acceptable and tolerable level.   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 Opticlock and aliginate with silver                                3) New since trip to [place], Dr. Luiz Iron, this week. Unstageable: Alginate with silver and optilock..  Foley catheter since June has nuerogenic bladder. They have tried to discontinue the foley and pt cannot void.  18 [place], 5/10 balloon.  Listened to pt and husband vent.  Long term plan: Move to one leve, discussed need for this soon and to consider other optionsl.  Son resides with them is helpful, but may not be available in the future.  PT was discontinued ([facility]) last week due to HTA denial to cont. services.  Needs PT/OT and nursing continued.  Has AFO for L leg and Double bar brace on R and has worn them since 2003/2010)  ? Copayment for nursing services.   Two goals ( one to heal wounds and two to strengthen and improve lower extremity strength) difficult to achieve simultaneously).  Outpatient Encounter Prescriptions as of 06/15/2017  Medication Sig Note  . acetaminophen (TYLENOL) 500 MG tablet Take 1,000 mg by mouth 3 (three) times daily.   . Amino Acids-Protein Hydrolys (FEEDING SUPPLEMENT, PRO-STAT SUGAR FREE 64,) LIQD Take 30 mLs by mouth 2 (two) times daily.   . carbamazepine (TEGRETOL) 200 MG tablet Take 200 mg by mouth 3 (three) times daily.   .  Cholecalciferol (VITAMIN D3 PO) Take 1 tablet by mouth daily.   Marland Kitchen CRANBERRY PO Take 1 tablet by mouth daily.   Marland Kitchen docusate sodium (COLACE) 100 MG capsule Take 100 mg by mouth daily.   . DULoxetine (CYMBALTA) 60 MG capsule Take 60 mg by mouth 2 (two) times daily.   Marland Kitchen gabapentin (NEURONTIN) 400 MG capsule Take 1 capsule (400 mg total) by mouth 3 (three) times daily. 06/15/2017: Takes 2 caps = 600 mg tid.  Marland Kitchen HYDROmorphone (DILAUDID) 2 MG tablet Take 1 tablet (2 mg total) by mouth every 6 (six) hours as needed for severe pain. 06/15/2017: Pt takes routinely for neuropathic pain.  . Multiple Vitamin (MULTIVITAMIN WITH MINERALS) TABS tablet Take 1 tablet by mouth daily.   . Omega-3 Fatty Acids (FISH OIL PO) Take 1 capsule by mouth 2 (two) times daily.   . ranitidine (ZANTAC) 75 MG tablet Take 75 mg by mouth 2 (two) times daily.   Marland Kitchen tiZANidine (ZANAFLEX) 4 MG capsule Take 4 mg by  mouth 2 (two) times daily.   Marland Kitchen amLODipine (NORVASC) 5 MG tablet Take 1 tablet (5 mg total) by mouth daily. (Patient not taking: Reported on 06/15/2017) 06/15/2017: On HOLD.  . [DISCONTINUED] diclofenac (FLECTOR) 1.3 % PTCH Place 1 patch onto the skin 2 (two) times daily. (Patient not taking: Reported on 06/15/2017)   . [DISCONTINUED] methocarbamol (ROBAXIN) 750 MG tablet Take 1 tablet (750 mg total) by mouth 3 (three) times daily.   . [DISCONTINUED] polyethylene glycol (MIRALAX / GLYCOLAX) packet Take 17 g by mouth daily as needed for mild constipation.    No facility-administered encounter medications on file as of 06/15/2017.    Plan: Report back to [facility] Director and HTA Medical Director          Refer to [facility]          Call Dr. Luiz Iron on Monday to request prior authorization for continued PT/OT and nursing for at least               another month.          See pt next week.    [name]. [name], MSN, [facility] Gerontological Nurse Practitioner [facility] Care Management [phone]

## 2017-06-16 DIAGNOSIS — M6281 Muscle weakness (generalized): Secondary | ICD-10-CM | POA: Diagnosis not present

## 2017-06-16 DIAGNOSIS — G629 Polyneuropathy, unspecified: Secondary | ICD-10-CM | POA: Diagnosis not present

## 2017-06-16 DIAGNOSIS — R269 Unspecified abnormalities of gait and mobility: Secondary | ICD-10-CM | POA: Diagnosis not present

## 2017-06-16 DIAGNOSIS — M5136 Other intervertebral disc degeneration, lumbar region: Secondary | ICD-10-CM | POA: Diagnosis not present

## 2017-06-16 DIAGNOSIS — R2689 Other abnormalities of gait and mobility: Secondary | ICD-10-CM | POA: Diagnosis not present

## 2017-06-18 ENCOUNTER — Other Ambulatory Visit: Payer: Self-pay | Admitting: *Deleted

## 2017-06-18 ENCOUNTER — Ambulatory Visit: Payer: Self-pay | Admitting: *Deleted

## 2017-06-18 ENCOUNTER — Encounter: Payer: Self-pay | Admitting: *Deleted

## 2017-06-18 DIAGNOSIS — M5136 Other intervertebral disc degeneration, lumbar region: Secondary | ICD-10-CM | POA: Diagnosis not present

## 2017-06-18 DIAGNOSIS — I10 Essential (primary) hypertension: Secondary | ICD-10-CM | POA: Diagnosis not present

## 2017-06-18 DIAGNOSIS — K219 Gastro-esophageal reflux disease without esophagitis: Secondary | ICD-10-CM | POA: Diagnosis not present

## 2017-06-18 DIAGNOSIS — Z9181 History of falling: Secondary | ICD-10-CM | POA: Diagnosis not present

## 2017-06-18 DIAGNOSIS — G609 Hereditary and idiopathic neuropathy, unspecified: Secondary | ICD-10-CM | POA: Diagnosis not present

## 2017-06-18 DIAGNOSIS — L89150 Pressure ulcer of sacral region, unstageable: Secondary | ICD-10-CM | POA: Diagnosis not present

## 2017-06-18 DIAGNOSIS — L89310 Pressure ulcer of right buttock, unstageable: Secondary | ICD-10-CM | POA: Diagnosis not present

## 2017-06-18 DIAGNOSIS — Z466 Encounter for fitting and adjustment of urinary device: Secondary | ICD-10-CM | POA: Diagnosis not present

## 2017-06-18 NOTE — Addendum Note (Signed)
Addended by: Almetta Lovely on: 06/18/2017 12:03 PM   Modules accepted: Orders

## 2017-06-18 NOTE — Patient Outreach (Signed)
[facility]) Care Management  06/18/2017  Theresa Flowers 1946/01/23 [id number]   This pt will be followed by Almetta Lovely, NP with [facility]. Will alert CMS and this RN will no longer be directly involved for case management needs at this time.   Elliot Cousin, RN Care Management Coordinator [facility] Main Office [phone]

## 2017-06-18 NOTE — Patient Outreach (Addendum)
Telephone calls for care coordination.  Called Dr. Einar Grad office to notify of fax sent and home health needs for this patient. Message to be relayed to [place].  Talked with pt to inform her home health services are being ordered. Also discussed and suggested palliative care services from [facility] and Theresa Flowers has agreed on a referral and consultation.  [name] and I discussed the current living situation and need to more forward towards finding suitable living space whether that be independent or supervised. She is also accepting of this now.  I have encouraged her to call with any needs that arise and I will be seeing them again at the end of the week.  I will reach out to [name] LCSW to assist with resources for starting the process to move out of current home. (Find appraisor, realtor, possible independent housing for the disabled).  [name]. [name], MSN, [facility] Gerontological Nurse Practitioner [facility] Care Management [phone]

## 2017-06-19 ENCOUNTER — Encounter: Payer: Self-pay | Admitting: *Deleted

## 2017-06-20 DIAGNOSIS — M48062 Spinal stenosis, lumbar region with neurogenic claudication: Secondary | ICD-10-CM | POA: Diagnosis not present

## 2017-06-20 DIAGNOSIS — M6281 Muscle weakness (generalized): Secondary | ICD-10-CM | POA: Diagnosis not present

## 2017-06-20 DIAGNOSIS — M5137 Other intervertebral disc degeneration, lumbosacral region: Secondary | ICD-10-CM | POA: Diagnosis not present

## 2017-06-20 DIAGNOSIS — G629 Polyneuropathy, unspecified: Secondary | ICD-10-CM | POA: Diagnosis not present

## 2017-06-20 DIAGNOSIS — M5136 Other intervertebral disc degeneration, lumbar region: Secondary | ICD-10-CM | POA: Diagnosis not present

## 2017-06-20 DIAGNOSIS — R2689 Other abnormalities of gait and mobility: Secondary | ICD-10-CM | POA: Diagnosis not present

## 2017-06-20 DIAGNOSIS — R269 Unspecified abnormalities of gait and mobility: Secondary | ICD-10-CM | POA: Diagnosis not present

## 2017-06-20 DIAGNOSIS — L89301 Pressure ulcer of unspecified buttock, stage 1: Secondary | ICD-10-CM | POA: Diagnosis not present

## 2017-06-20 DIAGNOSIS — N390 Urinary tract infection, site not specified: Secondary | ICD-10-CM | POA: Diagnosis not present

## 2017-06-22 ENCOUNTER — Other Ambulatory Visit: Payer: Self-pay | Admitting: *Deleted

## 2017-06-22 VITALS — BP 130/50 | HR 78 | Temp 98.5°F | Resp 16

## 2017-06-22 DIAGNOSIS — N390 Urinary tract infection, site not specified: Secondary | ICD-10-CM | POA: Insufficient documentation

## 2017-06-22 DIAGNOSIS — E860 Dehydration: Secondary | ICD-10-CM

## 2017-06-22 DIAGNOSIS — T83511A Infection and inflammatory reaction due to indwelling urethral catheter, initial encounter: Secondary | ICD-10-CM

## 2017-06-22 DIAGNOSIS — M48062 Spinal stenosis, lumbar region with neurogenic claudication: Secondary | ICD-10-CM

## 2017-06-22 DIAGNOSIS — L89159 Pressure ulcer of sacral region, unspecified stage: Secondary | ICD-10-CM

## 2017-06-22 NOTE — Patient Outreach (Addendum)
[facility]) Care Management   06/22/2017  Theresa Flowers 1946-01-17 [id number]  Theresa Flowers is an 71 y.o. female  Subjective: Follow up to home visit last week to follow up on mobility progression, skin condition. And to discuss long range plan of care.  Objective:   Review of Systems  Constitutional: Positive for chills, diaphoresis, fever and malaise/fatigue.  HENT: Negative.   Eyes: Negative.   Respiratory: Negative.   Cardiovascular: Negative.   Gastrointestinal: Negative.   Genitourinary:       Foley intact. New bag. Minimal sediment and clear yellow urine.  Musculoskeletal: Positive for back pain.  Skin:       Sacral pressure ulcer with dsg intact.  Neurological: Positive for sensory change.  Endo/Heme/Allergies: Negative.   Psychiatric/Behavioral: Positive for depression.   BP (!) 130/50 (BP Location: Left Arm, Patient Position: Supine, Cuff Size: Normal)   Pulse 78   Temp 98.5 F (36.9 C)   Resp 16   SpO2 94%   Physical Exam  Constitutional: She is oriented to person, place, and time. She appears well-developed and well-nourished.  HENT:  Head: Normocephalic.  Neck: Normal range of motion.  Cardiovascular: Normal rate, regular rhythm and normal heart sounds.   Respiratory: Effort normal and breath sounds normal.  GI: Soft. Bowel sounds are normal.  Musculoskeletal:  LE Paralysis.  Neurological: She is alert and oriented to person, place, and time.  Skin: Skin is warm and dry.    Encounter Medications:   Outpatient Encounter Prescriptions as of 06/22/2017  Medication Sig Note  . acetaminophen (TYLENOL) 500 MG tablet Take 1,000 mg by mouth 3 (three) times daily.   . Amino Acids-Protein Hydrolys (FEEDING SUPPLEMENT, PRO-STAT SUGAR FREE 64,) LIQD Take 30 mLs by mouth 2 (two) times daily.   Marland Kitchen amLODipine (NORVASC) 5 MG tablet Take 1 tablet (5 mg total) by mouth daily. (Patient not taking: Reported on 06/15/2017) 06/15/2017: On HOLD.  .  carbamazepine (TEGRETOL) 200 MG tablet Take 200 mg by mouth 3 (three) times daily.   . Cholecalciferol (VITAMIN D3 PO) Take 1 tablet by mouth daily.   Marland Kitchen CRANBERRY PO Take 1 tablet by mouth daily.   Marland Kitchen docusate sodium (COLACE) 100 MG capsule Take 100 mg by mouth daily.   . DULoxetine (CYMBALTA) 60 MG capsule Take 60 mg by mouth 2 (two) times daily.   Marland Kitchen gabapentin (NEURONTIN) 400 MG capsule Take 1 capsule (400 mg total) by mouth 3 (three) times daily. 06/15/2017: Takes 2 caps = 600 mg tid.  Marland Kitchen HYDROmorphone (DILAUDID) 2 MG tablet Take 1 tablet (2 mg total) by mouth every 6 (six) hours as needed for severe pain. 06/15/2017: Pt takes routinely for neuropathic pain.  . Multiple Vitamin (MULTIVITAMIN WITH MINERALS) TABS tablet Take 1 tablet by mouth daily.   . Omega-3 Fatty Acids (FISH OIL PO) Take 1 capsule by mouth 2 (two) times daily.   . ranitidine (ZANTAC) 75 MG tablet Take 75 mg by mouth 2 (two) times daily.   Marland Kitchen tiZANidine (ZANAFLEX) 4 MG capsule Take 4 mg by mouth 2 (two) times daily.    No facility-administered encounter medications on file as of 06/22/2017.     Functional Status:   In your present state of health, do you have any difficulty performing the following activities: 02/27/2017 02/20/2017  Hearing? N N  Vision? N N  Difficulty concentrating or making decisions? N N  Walking or climbing stairs? Y Y  Dressing or bathing? N N  Doing  errands, shopping? Y Y  Some recent data might be hidden    Fall/Depression Screening:    Fall Risk  06/15/2017 03/09/2017  Falls in the past year? Yes Yes  Number falls in past yr: 2 or more 2 or more  Injury with Fall? Yes No  Risk Factor Category  High Fall Risk High Fall Risk  Risk for fall due to : History of fall(s);Impaired balance/gait;Impaired mobility;Medication side effect Impaired mobility;Impaired balance/gait  Follow up Falls evaluation completed;Education provided;Falls prevention discussed;Follow up appointment Education provided    [facility] 2/9 Scores 06/15/2017 03/09/2017  PHQ - 2 Score 6 0  PHQ- 9 Score 15 -    Assessment:    Spinal stenosis of lumbar region with neurogenic claudication  Dehydration  Urinary tract infection associated with indwelling urethral catheter, initial encounter ([place])  Pressure injury of skin of sacral region, unspecified injury stage  Depression (15 on PHQ9, on cymblata)  Plan:   [facility] CM Care Plan Problem One     Most Recent Value  Care Plan Problem One  Declining mobility and increasing personal care needs.  Role Documenting the Problem One  Care Management [place] for Problem One  Active  [facility] Long Term Goal   Family to work together to start making goals and progress towards selling home and finding suitable living space for long term management.  [facility] Long Term Goal Start Date  06/18/17  Interventions for Problem One Long Term Goal  Continued discussion of long term plan for couple. Advised will be receiving information from facilities. Also gave referral for financial planning and move assistance. Met pt's son, Marylyn Ishihara, and elicited his help with getting ready to move, assisting Mr. Cheetam box up items, label items, making decisions what to give away and what to keepl  [facility] CM Short Term Goal #1   Couple will talk to at least one realtor over the next month.   [facility] CM Short Term Goal #1 Start Date  06/18/17  Interventions for Short Term Goal #1  Provided information on planning a move with a referral for someone who may be able to help them.  [facility] CM Short Term Goal #2   Husband and son to begin moving what they can into a storage space to clear home over the next 30 days.  Interventions for Short Term Goal #2  Continued discussion and encouraged beginning to pack.     I will call pt on Monday to follow up on her condition.  Eulah Pont. Myrtie Neither, MSN, [facility] Gerontological Nurse Practitioner [facility] Care Management [phone]

## 2017-06-25 ENCOUNTER — Other Ambulatory Visit: Payer: Self-pay | Admitting: *Deleted

## 2017-06-25 DIAGNOSIS — Z452 Encounter for adjustment and management of vascular access device: Secondary | ICD-10-CM | POA: Diagnosis not present

## 2017-06-25 DIAGNOSIS — E871 Hypo-osmolality and hyponatremia: Secondary | ICD-10-CM | POA: Diagnosis not present

## 2017-06-25 DIAGNOSIS — N319 Neuromuscular dysfunction of bladder, unspecified: Secondary | ICD-10-CM | POA: Diagnosis not present

## 2017-06-25 DIAGNOSIS — D649 Anemia, unspecified: Secondary | ICD-10-CM | POA: Diagnosis not present

## 2017-06-25 DIAGNOSIS — G8220 Paraplegia, unspecified: Secondary | ICD-10-CM | POA: Diagnosis not present

## 2017-06-25 DIAGNOSIS — S30810A Abrasion of lower back and pelvis, initial encounter: Secondary | ICD-10-CM | POA: Diagnosis not present

## 2017-06-25 DIAGNOSIS — R7881 Bacteremia: Secondary | ICD-10-CM | POA: Diagnosis not present

## 2017-06-25 DIAGNOSIS — D62 Acute posthemorrhagic anemia: Secondary | ICD-10-CM | POA: Diagnosis not present

## 2017-06-25 DIAGNOSIS — M726 Necrotizing fasciitis: Secondary | ICD-10-CM | POA: Diagnosis not present

## 2017-06-25 DIAGNOSIS — A419 Sepsis, unspecified organism: Secondary | ICD-10-CM | POA: Diagnosis not present

## 2017-06-25 DIAGNOSIS — L89150 Pressure ulcer of sacral region, unstageable: Secondary | ICD-10-CM | POA: Diagnosis not present

## 2017-06-25 DIAGNOSIS — R05 Cough: Secondary | ICD-10-CM | POA: Diagnosis not present

## 2017-06-25 DIAGNOSIS — G8929 Other chronic pain: Secondary | ICD-10-CM | POA: Diagnosis not present

## 2017-06-25 DIAGNOSIS — Z8249 Family history of ischemic heart disease and other diseases of the circulatory system: Secondary | ICD-10-CM | POA: Diagnosis not present

## 2017-06-25 DIAGNOSIS — K592 Neurogenic bowel, not elsewhere classified: Secondary | ICD-10-CM | POA: Diagnosis not present

## 2017-06-25 DIAGNOSIS — N730 Acute parametritis and pelvic cellulitis: Secondary | ICD-10-CM | POA: Diagnosis not present

## 2017-06-25 DIAGNOSIS — L89152 Pressure ulcer of sacral region, stage 2: Secondary | ICD-10-CM | POA: Diagnosis not present

## 2017-06-25 DIAGNOSIS — L8944 Pressure ulcer of contiguous site of back, buttock and hip, stage 4: Secondary | ICD-10-CM | POA: Diagnosis not present

## 2017-06-25 DIAGNOSIS — R509 Fever, unspecified: Secondary | ICD-10-CM | POA: Diagnosis not present

## 2017-06-25 DIAGNOSIS — M5136 Other intervertebral disc degeneration, lumbar region: Secondary | ICD-10-CM | POA: Diagnosis not present

## 2017-06-25 DIAGNOSIS — K5901 Slow transit constipation: Secondary | ICD-10-CM | POA: Diagnosis not present

## 2017-06-25 DIAGNOSIS — L02212 Cutaneous abscess of back [any part, except buttock]: Secondary | ICD-10-CM | POA: Diagnosis not present

## 2017-06-25 DIAGNOSIS — R532 Functional quadriplegia: Secondary | ICD-10-CM | POA: Diagnosis not present

## 2017-06-25 DIAGNOSIS — Z85828 Personal history of other malignant neoplasm of skin: Secondary | ICD-10-CM | POA: Diagnosis not present

## 2017-06-25 DIAGNOSIS — G629 Polyneuropathy, unspecified: Secondary | ICD-10-CM | POA: Diagnosis not present

## 2017-06-25 DIAGNOSIS — R6520 Severe sepsis without septic shock: Secondary | ICD-10-CM | POA: Diagnosis not present

## 2017-06-25 DIAGNOSIS — Z90710 Acquired absence of both cervix and uterus: Secondary | ICD-10-CM | POA: Diagnosis not present

## 2017-06-25 DIAGNOSIS — R21 Rash and other nonspecific skin eruption: Secondary | ICD-10-CM | POA: Diagnosis not present

## 2017-06-25 DIAGNOSIS — R269 Unspecified abnormalities of gait and mobility: Secondary | ICD-10-CM | POA: Diagnosis not present

## 2017-06-25 DIAGNOSIS — M6008 Infective myositis, other site: Secondary | ICD-10-CM | POA: Diagnosis not present

## 2017-06-25 DIAGNOSIS — I10 Essential (primary) hypertension: Secondary | ICD-10-CM | POA: Diagnosis not present

## 2017-06-25 DIAGNOSIS — Z7401 Bed confinement status: Secondary | ICD-10-CM | POA: Diagnosis not present

## 2017-06-25 DIAGNOSIS — M549 Dorsalgia, unspecified: Secondary | ICD-10-CM | POA: Diagnosis not present

## 2017-06-25 DIAGNOSIS — E869 Volume depletion, unspecified: Secondary | ICD-10-CM | POA: Diagnosis not present

## 2017-06-25 DIAGNOSIS — F444 Conversion disorder with motor symptom or deficit: Secondary | ICD-10-CM | POA: Diagnosis not present

## 2017-06-25 DIAGNOSIS — M4628 Osteomyelitis of vertebra, sacral and sacrococcygeal region: Secondary | ICD-10-CM | POA: Diagnosis not present

## 2017-06-25 DIAGNOSIS — M545 Low back pain: Secondary | ICD-10-CM | POA: Diagnosis not present

## 2017-06-25 DIAGNOSIS — L89154 Pressure ulcer of sacral region, stage 4: Secondary | ICD-10-CM | POA: Diagnosis not present

## 2017-06-25 DIAGNOSIS — M6281 Muscle weakness (generalized): Secondary | ICD-10-CM | POA: Diagnosis not present

## 2017-06-25 NOTE — Patient Outreach (Signed)
Called to follow up on pt's health status after starting antibiotic for UTI on Friday.  Called again to follow up at 2:00 pm. Left another message.  Mr. Rady called me back at 4:00 pm to advise his wife was taken to [facility] this am for ongoing fever. He says she has been evaluated and she will be having surgery on Tuesday to debride her sacal wounds.  I have asked him to keep me in the loop as far as her discharge plans are concerned. He agreed he would do so.  [name]. [name], MSN, [facility] Gerontological Nurse Practitioner [facility] Care Management [phone]

## 2017-06-26 ENCOUNTER — Other Ambulatory Visit: Payer: Self-pay | Admitting: *Deleted

## 2017-06-26 NOTE — Patient Outreach (Signed)
[facility]) Care Management  06/26/2017  Theresa Flowers [birth year] [id number]   CSW made an initial attempt to try and contact patient today to perform phone assessment, as well as assess and assist with social needs and services, without success.  A HIPAA compliant message was left for patient on voicemail.  CSW is currently awaiting a return call. CSW will make a second outreach attempt within the next week, if CSW does not receive a return call from patient in the meantime. Danford Bad, BSW, MSW, LCSW  Licensed Restaurant manager, fast food [facility] System  Mailing [place] [address], [place], [place] [address] Physical Address[address], [place], [place] [address] Toll Free Main # [phone] Fax # [phone] Cell # [phone]  Office # [phone] [name]

## 2017-06-27 ENCOUNTER — Other Ambulatory Visit: Payer: Self-pay | Admitting: *Deleted

## 2017-06-27 NOTE — Patient Outreach (Signed)
Outreach to [employer] nurse care manager, Ricky Ala, RN, who advises pt has not had PT/OT eval yet and her Hgb is low and she will need to receive some blood. She anticipates she may have to stay a few days.  I advised her of my role and that I will be checking in to prepare for pt discharge needs especially if she is to go home vs SNF.  [name]. [name], MSN, [facility] Gerontological Nurse Practitioner [facility] Care Management [phone]

## 2017-06-28 ENCOUNTER — Other Ambulatory Visit: Payer: Self-pay | Admitting: *Deleted

## 2017-06-28 NOTE — Patient Outreach (Signed)
[facility]) Care Management  06/28/2017  LORILYNN LEHR 14-Jan-1946 [id number]   CSW made a second attempt to try and contact patient today to perform phone assessment, as well as assess and assist with social needs and services, without success.  A HIPAA compliant message was left for patient on voicemail.  CSW is currently awaiting a return call. CSW will make a third and final outreach attempt within the next week, if CSW does not receive a return call from patient in the meantime. Danford Bad, BSW, MSW, LCSW  Licensed Restaurant manager, fast food [facility] System  Mailing [place] [address] [place], [place] [address] Physical Address[address], [place], [place] [address] Toll Free Main # [phone] Fax # [phone] Cell # [phone]  Office # [phone] [name]

## 2017-06-29 ENCOUNTER — Other Ambulatory Visit: Payer: Self-pay | Admitting: *Deleted

## 2017-06-29 NOTE — Patient Outreach (Signed)
[facility]) Care Management  06/29/2017  Theresa Flowers [birth year] [id number]   CSW made a second attempt to try and contact patient today to perform phone assessment, as well as assess and assist with social needs and services, without success.  A HIPAA compliant message was left for patient on voicemail.  CSW is currently awaiting a return call. CSW will make a third and final outreach attempt within the next week, if CSW does not receive a return call from patient in the meantime. Danford Bad, BSW, MSW, LCSW  Licensed Restaurant manager, fast food Health System  Mailing [name] [place], [place] [address] Physical Address[address], [place], [place] [address] Toll Free Main # [phone] Fax # [phone] Cell # [phone]  Office # [phone] [name]

## 2017-07-02 ENCOUNTER — Encounter: Payer: Self-pay | Admitting: *Deleted

## 2017-07-02 ENCOUNTER — Other Ambulatory Visit: Payer: Self-pay | Admitting: *Deleted

## 2017-07-02 NOTE — Patient Outreach (Signed)
[facility]) Care Management  07/02/2017  Theresa Flowers 1946/08/31 [id number]   CSW received a return call from patient's husband, [name] today, explaining that patient is currently hospitalized, but the plan is for her to go to a long-term care skilled nursing facility at time of discharge.  [name] went on to say that patient had surgery last Tuesday, October 23rd to have a decubitus removed from her back.  [name] reported that patient will be hospitalized "for a while, then will go to a high tech wound care facility".  Afterwards, the plan is for patient to be placed in a long-term care skilled nursing facility, as [name] admits that he is no longer able to care for patient in the home.  Patient currently requires 24 hour care and supervision.  CSW will perform a case closure on patient, as patient no longer meets criteria to be enrolled in [facility] Care Management services, due to patient being placed in a long-term care skilled nursing facility.  CSW will fax an update to patient's Primary Care Physician, Dr. Dennis Bast to ensure that they are aware of CSW's involvement with patient's plan of care.  CSW will submit a case closure request to Lucia Gaskins, Care Management Assistant with [facility] Care Management, in the form of an In [employer].   Danford Bad, BSW, MSW, LCSW  Licensed Restaurant manager, fast food Health System  Mailing [place] [address] [place], [place] [address] Physical Address[address], [place], [place] [address] Toll Free Main # [phone] Fax # [phone] Cell # [phone]  Office # [phone] [name]

## 2017-07-03 ENCOUNTER — Ambulatory Visit: Payer: Self-pay | Admitting: *Deleted

## 2017-07-04 ENCOUNTER — Other Ambulatory Visit: Payer: Self-pay | Admitting: *Deleted

## 2017-07-04 NOTE — Patient Outreach (Signed)
Call to pt home/husband for follow up information. He was not at home and I left a message for him to call me at his convenience.  [name]. [name], MSN, [facility] Gerontological Nurse Practitioner [facility] Care Management [phone]

## 2017-07-06 ENCOUNTER — Other Ambulatory Visit: Payer: Self-pay | Admitting: *Deleted

## 2017-07-06 DIAGNOSIS — D649 Anemia, unspecified: Secondary | ICD-10-CM | POA: Diagnosis not present

## 2017-07-06 DIAGNOSIS — R7881 Bacteremia: Secondary | ICD-10-CM | POA: Diagnosis not present

## 2017-07-06 DIAGNOSIS — G609 Hereditary and idiopathic neuropathy, unspecified: Secondary | ICD-10-CM | POA: Diagnosis not present

## 2017-07-06 DIAGNOSIS — R21 Rash and other nonspecific skin eruption: Secondary | ICD-10-CM | POA: Diagnosis not present

## 2017-07-06 DIAGNOSIS — R532 Functional quadriplegia: Secondary | ICD-10-CM | POA: Diagnosis not present

## 2017-07-06 DIAGNOSIS — G629 Polyneuropathy, unspecified: Secondary | ICD-10-CM | POA: Diagnosis not present

## 2017-07-06 DIAGNOSIS — I10 Essential (primary) hypertension: Secondary | ICD-10-CM | POA: Diagnosis not present

## 2017-07-06 DIAGNOSIS — K5901 Slow transit constipation: Secondary | ICD-10-CM | POA: Diagnosis not present

## 2017-07-06 DIAGNOSIS — F411 Generalized anxiety disorder: Secondary | ICD-10-CM | POA: Diagnosis not present

## 2017-07-06 DIAGNOSIS — K5900 Constipation, unspecified: Secondary | ICD-10-CM | POA: Diagnosis not present

## 2017-07-06 DIAGNOSIS — M62838 Other muscle spasm: Secondary | ICD-10-CM | POA: Diagnosis not present

## 2017-07-06 DIAGNOSIS — L89154 Pressure ulcer of sacral region, stage 4: Secondary | ICD-10-CM | POA: Diagnosis not present

## 2017-07-06 DIAGNOSIS — R339 Retention of urine, unspecified: Secondary | ICD-10-CM | POA: Diagnosis not present

## 2017-07-06 DIAGNOSIS — M5136 Other intervertebral disc degeneration, lumbar region: Secondary | ICD-10-CM | POA: Diagnosis not present

## 2017-07-06 DIAGNOSIS — R5381 Other malaise: Secondary | ICD-10-CM | POA: Diagnosis not present

## 2017-07-06 DIAGNOSIS — L8944 Pressure ulcer of contiguous site of back, buttock and hip, stage 4: Secondary | ICD-10-CM | POA: Diagnosis not present

## 2017-07-06 DIAGNOSIS — M5137 Other intervertebral disc degeneration, lumbosacral region: Secondary | ICD-10-CM | POA: Diagnosis not present

## 2017-07-06 DIAGNOSIS — R269 Unspecified abnormalities of gait and mobility: Secondary | ICD-10-CM | POA: Diagnosis not present

## 2017-07-06 DIAGNOSIS — K592 Neurogenic bowel, not elsewhere classified: Secondary | ICD-10-CM | POA: Diagnosis not present

## 2017-07-06 DIAGNOSIS — L89301 Pressure ulcer of unspecified buttock, stage 1: Secondary | ICD-10-CM | POA: Diagnosis not present

## 2017-07-06 DIAGNOSIS — B372 Candidiasis of skin and nail: Secondary | ICD-10-CM | POA: Diagnosis not present

## 2017-07-06 DIAGNOSIS — M549 Dorsalgia, unspecified: Secondary | ICD-10-CM | POA: Diagnosis not present

## 2017-07-06 DIAGNOSIS — R197 Diarrhea, unspecified: Secondary | ICD-10-CM | POA: Diagnosis not present

## 2017-07-06 DIAGNOSIS — R6520 Severe sepsis without septic shock: Secondary | ICD-10-CM | POA: Diagnosis not present

## 2017-07-06 DIAGNOSIS — M48062 Spinal stenosis, lumbar region with neurogenic claudication: Secondary | ICD-10-CM | POA: Diagnosis not present

## 2017-07-06 DIAGNOSIS — N319 Neuromuscular dysfunction of bladder, unspecified: Secondary | ICD-10-CM | POA: Diagnosis not present

## 2017-07-06 DIAGNOSIS — G8929 Other chronic pain: Secondary | ICD-10-CM | POA: Diagnosis not present

## 2017-07-06 DIAGNOSIS — Z658 Other specified problems related to psychosocial circumstances: Secondary | ICD-10-CM | POA: Diagnosis not present

## 2017-07-06 DIAGNOSIS — M4628 Osteomyelitis of vertebra, sacral and sacrococcygeal region: Secondary | ICD-10-CM | POA: Diagnosis not present

## 2017-07-06 DIAGNOSIS — F329 Major depressive disorder, single episode, unspecified: Secondary | ICD-10-CM | POA: Diagnosis not present

## 2017-07-06 DIAGNOSIS — A419 Sepsis, unspecified organism: Secondary | ICD-10-CM | POA: Diagnosis not present

## 2017-07-06 DIAGNOSIS — M6281 Muscle weakness (generalized): Secondary | ICD-10-CM | POA: Diagnosis not present

## 2017-07-06 DIAGNOSIS — R2689 Other abnormalities of gait and mobility: Secondary | ICD-10-CM | POA: Diagnosis not present

## 2017-07-06 NOTE — Patient Outreach (Signed)
I spoke with Mr. And [name] yesterday on two separate calls. [name] has had a lengthy hosptialization at [facility] for sepsis and surgical debridement of her sacral wound. She reports she is to [facility] if all goes well. She reports that the transition to [facility] for wound care was denied and the current plan was approved. Both [name] are skeptical about the care that can be provided at [employer].  [name] and I discussed long range plans and possible scenarios. I refenforced to him that [name], in my opinion, should not ever come back home to their current residence as it is not conducive to her receiving the best care and environment that she needs. (Their home has multiple levels, steps, current bed room too small to provide proper care and she is extremely isolated).  [name] says he has not had time to call the resource I gave him for [facility]. He has received a lot of information on local ALFs which he has determined is not possible to consider because of costs.  He plans on investigating to see if he can move into a one level home where they both would have the space needed. He will also contemplate how many hours of assistance they could afford daily from a private care giver.  I have advised I will investigate the reason she was turned down to go to [employer].   I will also continue to check in with [name] on a weekly basis for support.  Theresa Flowers. Theresa Estelle, MSN, [facility] Gerontological Nurse Practitioner [facility] Care Management [phone]

## 2017-07-09 DIAGNOSIS — D649 Anemia, unspecified: Secondary | ICD-10-CM | POA: Diagnosis not present

## 2017-07-09 DIAGNOSIS — I10 Essential (primary) hypertension: Secondary | ICD-10-CM | POA: Diagnosis not present

## 2017-07-09 DIAGNOSIS — L89154 Pressure ulcer of sacral region, stage 4: Secondary | ICD-10-CM | POA: Diagnosis not present

## 2017-07-09 DIAGNOSIS — K5900 Constipation, unspecified: Secondary | ICD-10-CM | POA: Diagnosis not present

## 2017-07-10 DIAGNOSIS — R5381 Other malaise: Secondary | ICD-10-CM | POA: Diagnosis not present

## 2017-07-10 DIAGNOSIS — L89154 Pressure ulcer of sacral region, stage 4: Secondary | ICD-10-CM | POA: Diagnosis not present

## 2017-07-10 IMAGING — MR MR LUMBAR SPINE W/O CM
4 of 5 series · 18 of 48 positions shown · non-contrast
Comparison: CT lumbar spine February 19, 2017 and MRI of lumbar spine
August 23, 2016

CLINICAL DATA: Fall, buttock, RIGHT hip and leg pain. Symptoms
began in [REDACTED] last year. History of neuropathy.

EXAM:
MRI LUMBAR SPINE WITHOUT CONTRAST
TECHNIQUE: Multiplanar, multisequence MR imaging of the lumbar spine was
performed. No intravenous contrast was administered.

[Series 3: T1 · sagittal · 4.0mm · 0.51mm/px · 3 of 12 slices shown (1 of 2)]
[im 3/12]
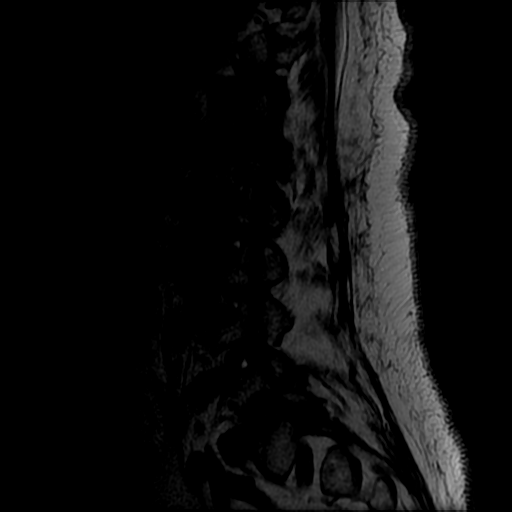
[im 7/12]
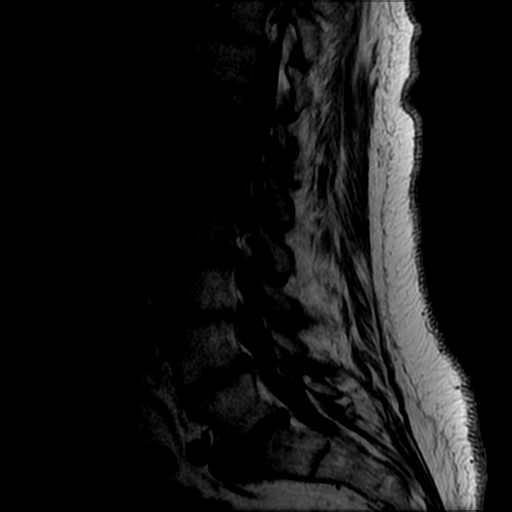
[im 12/12]
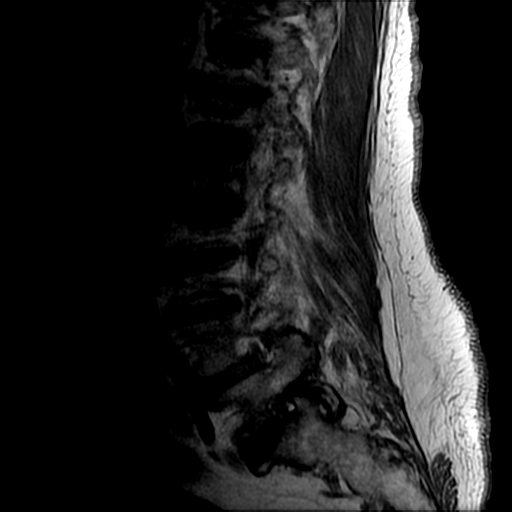

[Series 4: T2 post-contrast · sagittal · 4.0mm · 0.51mm/px · 5 of 13 slices shown]
[im 1/13]
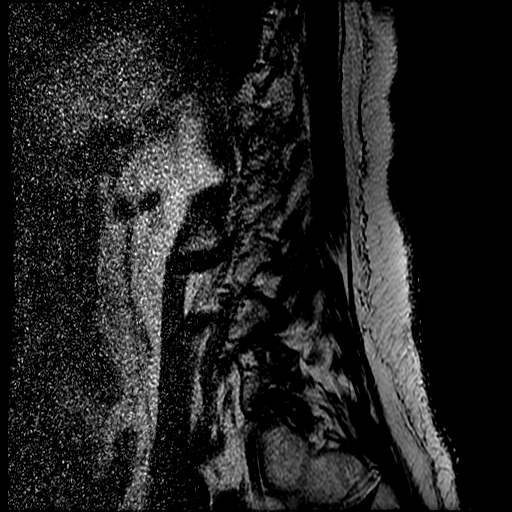
[im 4/13]
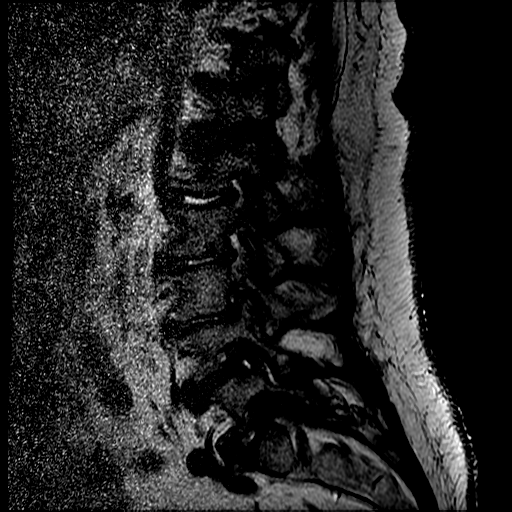
[im 7/13]
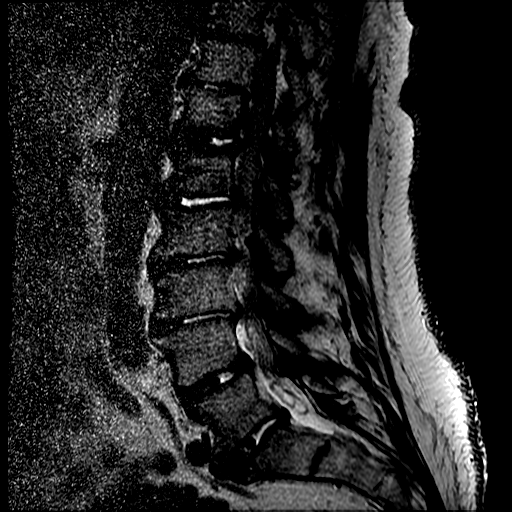
[im 10/13]
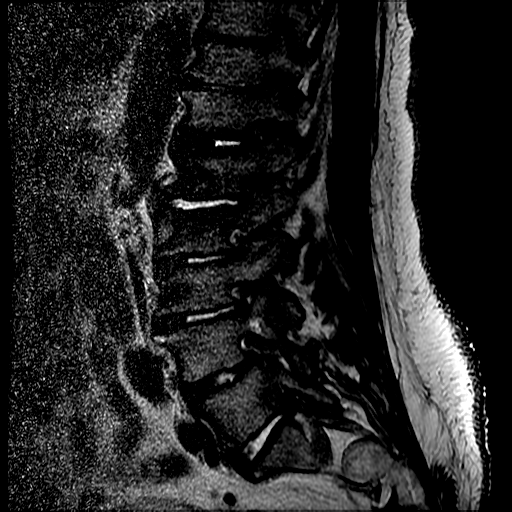
[im 13/13]
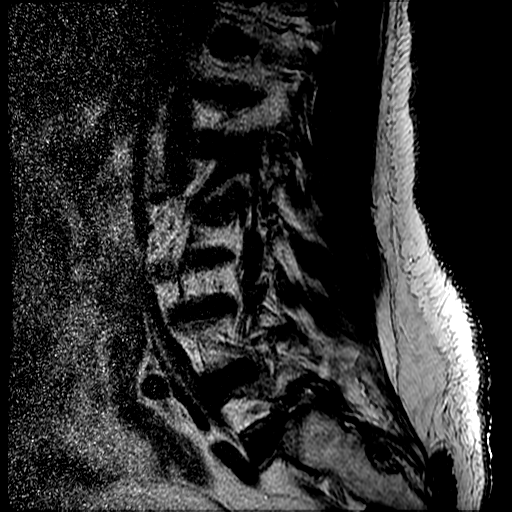

[Series 6: T2 · axial · 4.0mm · 0.39mm/px · z∈[-89,+61]mm · 7 of 39 slices shown]
[im 3/39]
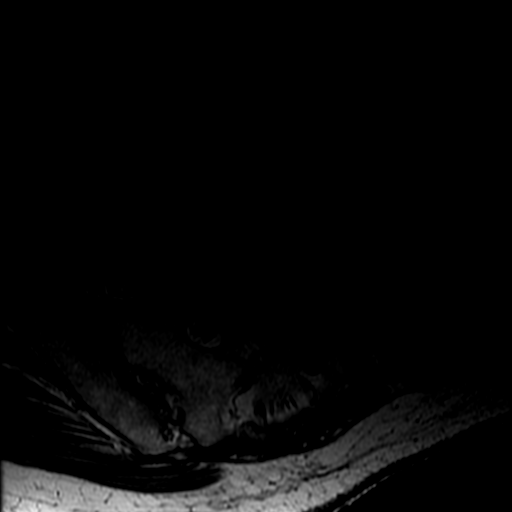
[im 6/39]
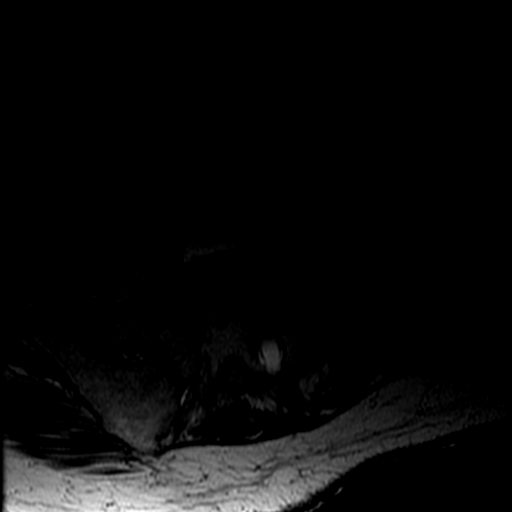
[im 8/39]
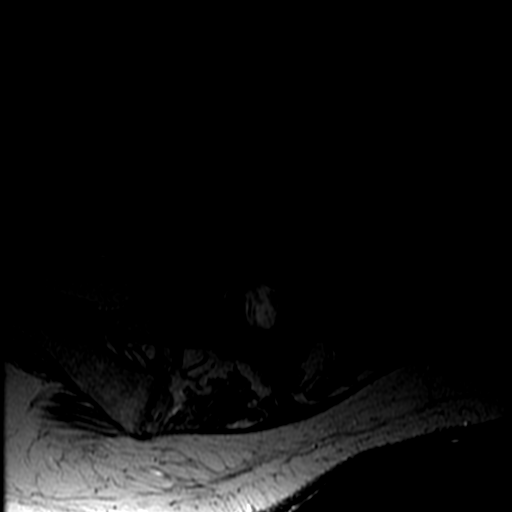
[im 13/39]
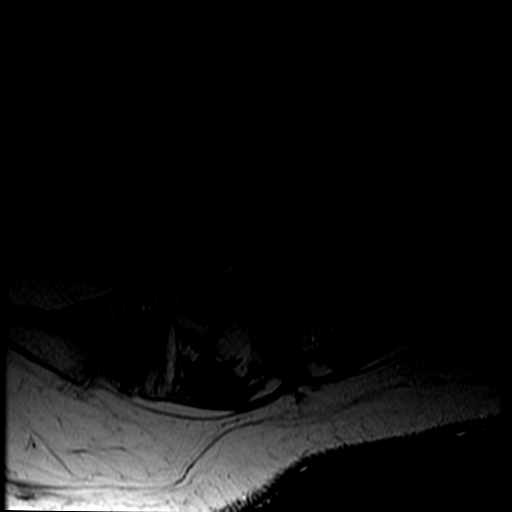
[im 18/39]
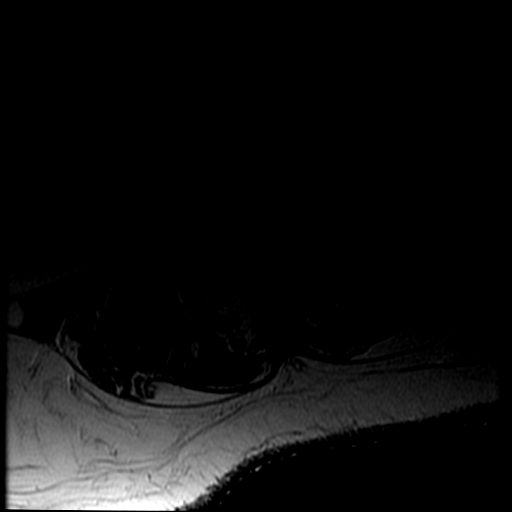
[im 21/39]
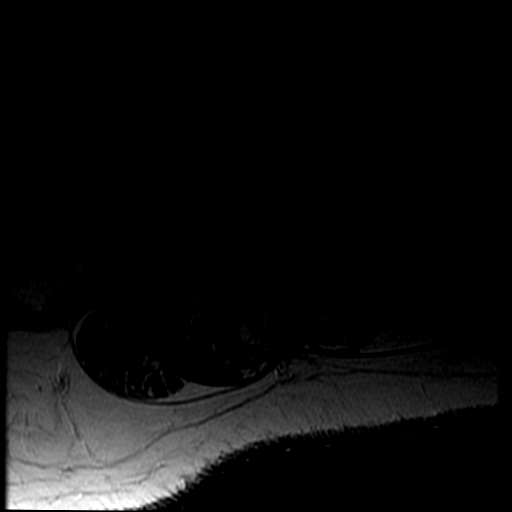
[im 33/39]
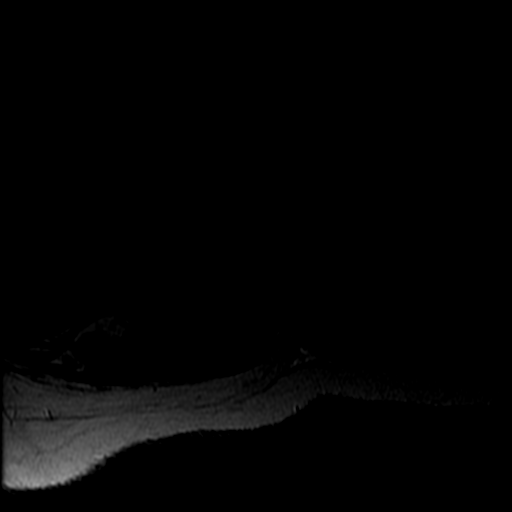

[Series 7: T1 · axial · 4.0mm · 0.39mm/px · z∈[-74,+61]mm · 3 of 39 slices shown (2 of 2)]
[im 6/39]
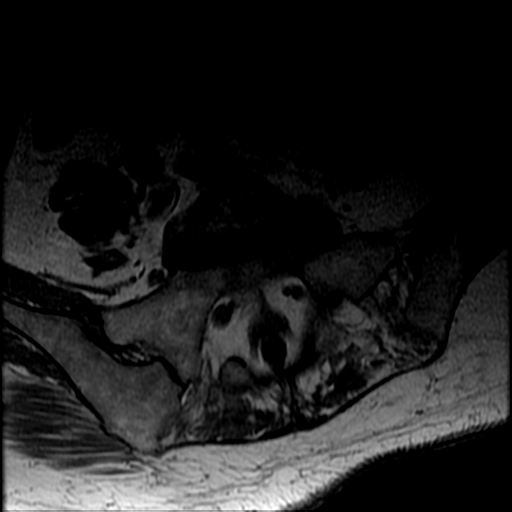
[im 21/39]
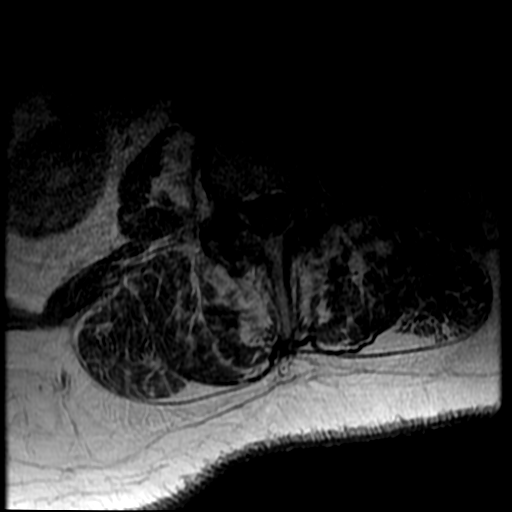
[im 33/39]
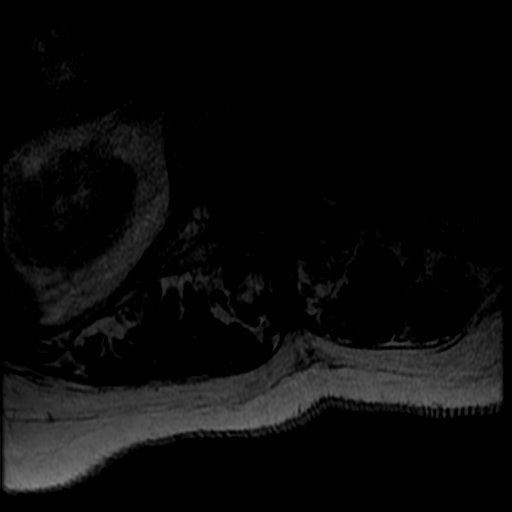

[18 of 48 positions shown; findings below may reference images not displayed]

FINDINGS: Moderately motion degraded examination.

SEGMENTATION: For the purposes of this report, the last well-formed
intervertebral disc will be described as L5-S1.

ALIGNMENT: Maintenance of the lumbar lordosis. Minimal grade 1 L4-5
anterolisthesis without spondylolysis.

VERTEBRAE:Vertebral bodies are intact. Severe thoracic and, L1-2
thru L3-4 disc height loss, moderate L4-5 and L5-S1. Disc height
loss is similar with T2 bright disc edema all levels, vacuum disc on
prior CT could not system with degenerative, benign signal. Severe
acute on chronic discogenic endplate changes T12-L1, L1-2. Moderate
chronic discogenic endplate changes L2-3 through L5-S1.

CONUS MEDULLARIS: Motion obscures the spinal cord, conus medullaris
not identified or characterized. Limited assessment of cauda equina.

PARASPINAL AND SOFT TISSUES: Included prevertebral and paraspinal
soft tissues are nonacute. Mild symmetric paraspinal muscle atrophy.

DISC LEVELS:

T12-L1 Moderate central disc extrusion with superior migration,
bright STIR signal suggesting acute process. Moderate broad-based
disc bulge. Moderate to severe facet arthropathy and ligamentum
flavum redundancy. Moderate to severe canal stenosis. Mild RIGHT,
moderate LEFT neural foraminal narrowing.

L1-2: Moderate broad-based disc bulge, moderate facet arthropathy
and ligamentum flavum redundancy. Moderate to severe canal stenosis
slightly worse than prior examination. Moderate to severe RIGHT,
moderate LEFT neural foraminal narrowing.

L2-3: Moderate broad-based disc bulge asymmetric to the RIGHT may
affect the exited RIGHT L3 nerve. Moderate facet arthropathy and
ligamentum flavum redundancy. Moderate to severe canal stenosis.
Moderate bilateral neural foraminal narrowing.

L3-4: Small broad-based disc bulge, endplate spurring. Moderate
facet arthropathy and ligamentum flavum redundancy. Moderate to
severe canal stenosis is similar to worse. Moderate bilateral neural
foraminal narrowing.

L4-5 anterolisthesis. Small broad-based disc bulge, severe facet
arthropathy and ligamentum flavum redundancy with trace facet
effusions are which are likely reactive. Moderate canal stenosis.
Moderate to severe RIGHT, severe LEFT neural foraminal narrowing.

L5-S1: Moderate broad-based disc bulge. Moderate to severe facet
arthropathy. No canal stenosis. Moderate to severe RIGHT, severe
LEFT neural foraminal narrowing.
IMPRESSION: Moderately motion degraded examination. No acute. Stable minimal
grade 1 L4-5 anterolisthesis.

New moderate T12-L1 acute appearing disc extrusion.

Moderate to severe canal stenosis L1-2 thru L3-4, moderate at L4-5.

Neural foraminal narrowing all lumbar levels: Severe on the LEFT at
L4-5 and L5-S1.

## 2017-07-11 DIAGNOSIS — L89154 Pressure ulcer of sacral region, stage 4: Secondary | ICD-10-CM | POA: Diagnosis not present

## 2017-07-11 DIAGNOSIS — R197 Diarrhea, unspecified: Secondary | ICD-10-CM | POA: Diagnosis not present

## 2017-07-11 DIAGNOSIS — G8929 Other chronic pain: Secondary | ICD-10-CM | POA: Diagnosis not present

## 2017-07-11 DIAGNOSIS — B372 Candidiasis of skin and nail: Secondary | ICD-10-CM | POA: Diagnosis not present

## 2017-07-11 DIAGNOSIS — K5900 Constipation, unspecified: Secondary | ICD-10-CM | POA: Diagnosis not present

## 2017-07-11 DIAGNOSIS — D649 Anemia, unspecified: Secondary | ICD-10-CM | POA: Diagnosis not present

## 2017-07-11 DIAGNOSIS — G629 Polyneuropathy, unspecified: Secondary | ICD-10-CM | POA: Diagnosis not present

## 2017-07-12 ENCOUNTER — Other Ambulatory Visit: Payer: Self-pay

## 2017-07-12 ENCOUNTER — Other Ambulatory Visit: Payer: Self-pay | Admitting: *Deleted

## 2017-07-12 NOTE — Patient Outreach (Signed)
Called [name] to follow up on how his wife is progressing at the SNF. I was not able to talk with him but left a message that I did call and to return my call.  I also informed him that I know of a home for sale that has handicapped access, ramps and a large bedroom and walk in shower.  [name]. [name], MSN, [facility] Gerontological Nurse Practitioner [facility] Care Management [phone]

## 2017-07-12 NOTE — Patient Outreach (Signed)
[facility]) Care Management  07/12/2017  Theresa Flowers [birth year] [id number]  Transition of care  Referral date: 07/12/17 Referral source: discharged from an inpatient admission from [facility] on 07-06-17 Insurance: [employer] advantage  Telephone call to patient for transition of care follow up. Unable to reach patient. HIPAA compliant voice message left with call back phone number.    PLAN: RNCM will attempt 2nd telephone call to patient within 5 business days.   George Ina RN,BSN,CCM [facility] Telephonic  [phone]

## 2017-07-16 ENCOUNTER — Ambulatory Visit: Payer: Self-pay

## 2017-07-16 DIAGNOSIS — L89154 Pressure ulcer of sacral region, stage 4: Secondary | ICD-10-CM | POA: Diagnosis not present

## 2017-07-16 DIAGNOSIS — G8929 Other chronic pain: Secondary | ICD-10-CM | POA: Diagnosis not present

## 2017-07-16 DIAGNOSIS — M62838 Other muscle spasm: Secondary | ICD-10-CM | POA: Diagnosis not present

## 2017-07-16 DIAGNOSIS — R5381 Other malaise: Secondary | ICD-10-CM | POA: Diagnosis not present

## 2017-07-17 ENCOUNTER — Other Ambulatory Visit: Payer: Self-pay

## 2017-07-17 NOTE — Patient Outreach (Signed)
[facility]) Care Management  07/17/2017  Theresa Flowers [birth year] [id number]  Patient is being followed by Almetta Lovely, care management coordinator.   PLAN: No further follow up needed by this RNCM.   George Ina RN,BSN,CCM [facility] Telephonic  [phone]

## 2017-07-23 DIAGNOSIS — R339 Retention of urine, unspecified: Secondary | ICD-10-CM | POA: Diagnosis not present

## 2017-07-23 DIAGNOSIS — G609 Hereditary and idiopathic neuropathy, unspecified: Secondary | ICD-10-CM | POA: Diagnosis not present

## 2017-07-23 DIAGNOSIS — N319 Neuromuscular dysfunction of bladder, unspecified: Secondary | ICD-10-CM | POA: Diagnosis not present

## 2017-07-23 DIAGNOSIS — M48062 Spinal stenosis, lumbar region with neurogenic claudication: Secondary | ICD-10-CM | POA: Diagnosis not present

## 2017-07-27 DIAGNOSIS — L89154 Pressure ulcer of sacral region, stage 4: Secondary | ICD-10-CM | POA: Diagnosis not present

## 2017-07-27 DIAGNOSIS — B372 Candidiasis of skin and nail: Secondary | ICD-10-CM | POA: Diagnosis not present

## 2017-07-27 DIAGNOSIS — I10 Essential (primary) hypertension: Secondary | ICD-10-CM | POA: Diagnosis not present

## 2017-07-27 DIAGNOSIS — G629 Polyneuropathy, unspecified: Secondary | ICD-10-CM | POA: Diagnosis not present

## 2017-07-30 DIAGNOSIS — L89154 Pressure ulcer of sacral region, stage 4: Secondary | ICD-10-CM | POA: Diagnosis not present

## 2017-07-30 DIAGNOSIS — B372 Candidiasis of skin and nail: Secondary | ICD-10-CM | POA: Diagnosis not present

## 2017-07-30 DIAGNOSIS — N319 Neuromuscular dysfunction of bladder, unspecified: Secondary | ICD-10-CM | POA: Diagnosis not present

## 2017-07-30 DIAGNOSIS — G629 Polyneuropathy, unspecified: Secondary | ICD-10-CM | POA: Diagnosis not present

## 2017-08-01 ENCOUNTER — Other Ambulatory Visit: Payer: Self-pay | Admitting: *Deleted

## 2017-08-01 NOTE — Patient Outreach (Signed)
Telephone call to husband to follow up on Mrs. Monreal's progress and if he had followed up on the lead I provided for a handicapped prepared home for sale. I did not reach Mr. Rice but left a message for him to return my call.  [name]. [name], MSN, [facility] Gerontological Nurse Practitioner [facility] Care Management [phone]

## 2017-08-06 DIAGNOSIS — G8929 Other chronic pain: Secondary | ICD-10-CM | POA: Diagnosis not present

## 2017-08-06 DIAGNOSIS — L89154 Pressure ulcer of sacral region, stage 4: Secondary | ICD-10-CM | POA: Diagnosis not present

## 2017-08-06 DIAGNOSIS — I10 Essential (primary) hypertension: Secondary | ICD-10-CM | POA: Diagnosis not present

## 2017-08-06 DIAGNOSIS — D649 Anemia, unspecified: Secondary | ICD-10-CM | POA: Diagnosis not present

## 2017-08-10 ENCOUNTER — Other Ambulatory Visit: Payer: Self-pay | Admitting: *Deleted

## 2017-08-10 NOTE — Patient Outreach (Signed)
Telephone call to Mr. Theresa Flowers to follow up. Advised that I have had conversations with [employer] and they have reassured me that she will be covered as long as she has the SNF need through 100 days. He was very glad to hear this. We also discussed the importance of being active to plan for her eventual discharge home. He was very interested and open to discussion about [facility]. He has a neighbor that utilizes this service and will inquire about it. I have asked him to talk with Corrie Dandy at the facility she is the director about having the PT/OT staff visit their home to evaluate what needs to be done to made her transition home successful. He said he would make that request.   We also discussed the standard of care for someone with a wound that cannot turn themselves, which is they should be repositioned every 2 hours. He reports he has noted that this is not always done and it is a concern for him and Theresa Flowers. I have asked him to address this also with the director of the facility.  I will pick up some information from [facility] and provide it to him. I will call him next Wednesday.  Theresa Flowers. [name], MSN, [facility] Gerontological Nurse Practitioner [facility] Care Management [phone]

## 2017-08-15 ENCOUNTER — Ambulatory Visit: Payer: Self-pay | Admitting: *Deleted

## 2017-08-16 ENCOUNTER — Other Ambulatory Visit: Payer: Self-pay | Admitting: *Deleted

## 2017-08-16 NOTE — Patient Outreach (Signed)
Care coordination call to Theresa Flowers. He has not had a chance to go speak with the neighbors about [facility] yet because of the snow. He also has not talked with the facility about doing an in home evaluation so they can put in to place a plan to get the home prepared adequately for Mrs. Lingo to come home. Today, he says he will do these things. I have also given him the number for [facility] for him to call for information himself. I will call and talk to him again next week.  [name]. [name], MSN, [facility] Gerontological Nurse Practitioner [facility] Care Management [phone]

## 2017-08-21 DIAGNOSIS — G8929 Other chronic pain: Secondary | ICD-10-CM | POA: Diagnosis not present

## 2017-08-21 DIAGNOSIS — D649 Anemia, unspecified: Secondary | ICD-10-CM | POA: Diagnosis not present

## 2017-08-21 DIAGNOSIS — L89154 Pressure ulcer of sacral region, stage 4: Secondary | ICD-10-CM | POA: Diagnosis not present

## 2017-08-21 DIAGNOSIS — I10 Essential (primary) hypertension: Secondary | ICD-10-CM | POA: Diagnosis not present

## 2017-08-23 ENCOUNTER — Other Ambulatory Visit: Payer: Self-pay | Admitting: *Deleted

## 2017-08-23 NOTE — Patient Outreach (Signed)
Telephone care coodination call, Mr. Glahn was not able to answer the phone. I did leave a message for a return call. If I do not hear from him I will call him on Monday, Dec 31st.  Ashwika Freels C. [name], MSN, [facility] Gerontological Nurse Practitioner [facility] Care Management [phone]

## 2017-09-03 ENCOUNTER — Other Ambulatory Visit: Payer: Self-pay | Admitting: *Deleted

## 2017-09-03 DIAGNOSIS — M62838 Other muscle spasm: Secondary | ICD-10-CM | POA: Diagnosis not present

## 2017-09-03 DIAGNOSIS — L89154 Pressure ulcer of sacral region, stage 4: Secondary | ICD-10-CM | POA: Diagnosis not present

## 2017-09-03 DIAGNOSIS — I10 Essential (primary) hypertension: Secondary | ICD-10-CM | POA: Diagnosis not present

## 2017-09-03 DIAGNOSIS — G629 Polyneuropathy, unspecified: Secondary | ICD-10-CM | POA: Diagnosis not present

## 2017-09-03 NOTE — Patient Outreach (Signed)
Called pt husband for an update on pt's condition and discharge plans. I was not able to reach him and left a voice mail and requested a return call.  [name]. [name], MSN, [facility] Gerontological Nurse Practitioner [facility] Care Management [phone]

## 2017-09-17 DIAGNOSIS — K5900 Constipation, unspecified: Secondary | ICD-10-CM | POA: Diagnosis not present

## 2017-09-17 DIAGNOSIS — L89154 Pressure ulcer of sacral region, stage 4: Secondary | ICD-10-CM | POA: Diagnosis not present

## 2017-09-17 DIAGNOSIS — G8929 Other chronic pain: Secondary | ICD-10-CM | POA: Diagnosis not present

## 2017-09-17 DIAGNOSIS — I10 Essential (primary) hypertension: Secondary | ICD-10-CM | POA: Diagnosis not present

## 2017-09-19 ENCOUNTER — Encounter: Payer: Self-pay | Admitting: Internal Medicine

## 2017-09-28 ENCOUNTER — Other Ambulatory Visit: Payer: Self-pay | Admitting: *Deleted

## 2017-09-28 NOTE — Patient Outreach (Signed)
Telephone call to pt home to speak with husband about his wife's discharge plan. I left a message for him to call me at his convenience.  Also called [facility] to discuss discharge plan. I left with Celso Sickle, LCSW, to return my call to discuss how pt and family are progressing towards a successful transition home or if there are other plans on the table.  [name]. [name], MSN, [facility] Gerontological Nurse Practitioner [facility] Care Management [phone]

## 2017-10-01 ENCOUNTER — Other Ambulatory Visit: Payer: Self-pay | Admitting: *Deleted

## 2017-10-01 NOTE — Patient Outreach (Signed)
Called Celso Sickle, LCSW at [facility] to discuss pt discharge plan. She reports she gave Medicaid application to Mr. Schrum and Corrie Dandy filled out the administrative part of the [employer]. She is not confident that they can discharge her safely home. Mr. Pennings is now walking with a walker. Their son is supposedly moving out of state. Corrie Dandy shared that she spoke to Mr and Mrs. Pacha further about [facility] but they are reluctant because of the funding. Corrie Dandy advised there may be a care plan meeting for them on Wednesday in which she invited me to attend which I would very much like to. She will advise me.  [name]. [name], MSN, [facility] Gerontological Nurse Practitioner [facility] Care Management [phone]

## 2017-10-03 ENCOUNTER — Other Ambulatory Visit: Payer: Self-pay | Admitting: *Deleted

## 2017-10-04 ENCOUNTER — Other Ambulatory Visit: Payer: Self-pay | Admitting: *Deleted

## 2017-10-04 NOTE — Patient Outreach (Signed)
Participated in care planning/discharge planning meeting with pt, husband, son and staff at [facility]. Pt has 10 days left of her 100 day SNF benefit. She has reached her potential with PT and her last day will be today. Her husband and son have been trained to transfer her with the use of a specialized transfer board. She still has a sacral wound that has closed to 50% of what it initially started out as. Current measurements are 10.2 cm X 8.1 cm X 1 cm. Current treatment is using wound cleanser, skin prep around the wound and wet to dry saline dsg and it is being changed bid. There is reportedly a curling wound edge that does sound like it does need treatment to allow for closure of the wound. [name] spirits are very good. She also looks healthier and much happier. Mr. Rosenbloom also appears to be in better physical condition. He is now using a walker and is able to stand up with much improved posture. The facility has done their part in applying for Medicaid for custodial care in the facility and Mr. Weisse has done his part.  Our plan A is that she will be approved for Medicaid and stay in the facility until her wound heals. Then she will go home. We have asked Mr. Jacober to communicate with the Medicaid social worker, Yolanda Header, LCSW, to follow up on the application and find out if she may qualify for Medicaid when she goes home. This would allow her to be eligible for a PCA worker to assist her in the home for several hours a day. We have also talked a lot about the [facility] program. Mr. Mentzel has not followed through on having a visit and interview and filling out an application there for potential future use. I did provide him with a [facility] brochure and which included the financial work sheet. I encouraged him to follow through on this so he could find out what the financial obligation would be for her to go a couple of days a week to [facility] and give him respite on those days.  All in all I  feel that the facility has provided excellent care. The pt and family is happy and the current plan of care is acceptable to everyone involved.  I will continue to follow regularly and will call Mr. Harrigan next week.  Josslynn Mentzer C. Jobie Popp, MSN, GNP-BC Gerontological Nurse Practitioner [facility] [phone]  

## 2017-10-04 NOTE — Patient Outreach (Signed)
Participated in care planning/discharge planning meeting with pt, husband, son and staff at [facility]. Pt has 10 days left of her 100 day SNF benefit. She has reached her potential with PT and her last day will be today. Her husband and son have been trained to transfer her with the use of a specialized transfer board. She still has a sacral wound that has closed to 50% of what it initially started out as. Current measurements are 10.2 cm X 8.1 cm X 1 cm. Current treatment is using wound cleanser, skin prep around the wound and wet to dry saline dsg and it is being changed bid. There is reportedly a curling wound edge that does sound like it does need treatment to allow for closure of the wound. Mrs. Mcbrayer's spirits are very good. She also looks healthier and much happier. Mr. Kleckley also appears to be in better physical condition. He is now using a walker and is able to stand up with much improved posture. The facility has done their part in applying for Medicaid for custodial care in the facility and Mr. Rail has done his part.  Our plan A is that she will be approved for Medicaid and stay in the facility until her wound heals. Then she will go home. We have asked Mr. Sanks to communicate with the Medicaid social worker, [place], LCSW, to follow up on the application and find out if she may qualify for Medicaid when she goes home. This would allow her to be eligible for a PCA worker to assist her in the home for several hours a day. We have also talked a lot about the [facility] program. Mr. Vanacker has not followed through on having a visit and interview and filling out an application there for potential future use. I did provide him with a PACE brochure and which included the financial work sheet. I encouraged him to follow through on this so he could find out what the financial obligation would be for her to go a couple of days a week to [facility] and give him respite on those days.  All in all I  feel that the facility has provided excellent care. The pt and family is happy and the current plan of care is acceptable to everyone involved.  I will continue to follow regularly and will call Mr. Ringenberg next week.  [name]. [name], MSN, [facility] Gerontological Nurse Practitioner [facility] Care Management [phone]

## 2017-10-08 DIAGNOSIS — F329 Major depressive disorder, single episode, unspecified: Secondary | ICD-10-CM | POA: Diagnosis not present

## 2017-10-08 DIAGNOSIS — F411 Generalized anxiety disorder: Secondary | ICD-10-CM | POA: Diagnosis not present

## 2017-10-11 ENCOUNTER — Other Ambulatory Visit: Payer: Self-pay | Admitting: *Deleted

## 2017-10-12 ENCOUNTER — Other Ambulatory Visit: Payer: Self-pay | Admitting: *Deleted

## 2017-10-12 DIAGNOSIS — M62838 Other muscle spasm: Secondary | ICD-10-CM | POA: Diagnosis not present

## 2017-10-12 DIAGNOSIS — G629 Polyneuropathy, unspecified: Secondary | ICD-10-CM | POA: Diagnosis not present

## 2017-10-12 DIAGNOSIS — G8929 Other chronic pain: Secondary | ICD-10-CM | POA: Diagnosis not present

## 2017-10-12 NOTE — Patient Outreach (Signed)
Mr. Lansing called me today and told me they had found a long term care insurance policy that will help them pay for the continuing custodial stay at [facility]. They are very much relieved of some of the financial stress they have been feeling. They still have not found if Mrs. Stough will be approved or LTC Medicaid. Mr. Kernes says he will update me if there is any news. I will scheudule a call to him in 2 weeks.  [name]. [name], MSN, [facility] Gerontological Nurse Practitioner [facility] Care Management [phone]

## 2017-10-15 NOTE — Patient Outreach (Signed)
Mr. Gaultney returned my call and reported that he has found a LTC insurance plan and has reported this to DSS and to [facility]. He states he wife is doing well and everything seems to be going along fine for her to stay there until her wound is healed.  I will stay in contact every other week.  [name]. [name], MSN, [facility] Gerontological Nurse Practitioner [facility] Care Management [phone]

## 2017-10-17 DIAGNOSIS — G629 Polyneuropathy, unspecified: Secondary | ICD-10-CM | POA: Diagnosis not present

## 2017-10-17 DIAGNOSIS — R269 Unspecified abnormalities of gait and mobility: Secondary | ICD-10-CM | POA: Diagnosis not present

## 2017-10-17 DIAGNOSIS — M5136 Other intervertebral disc degeneration, lumbar region: Secondary | ICD-10-CM | POA: Diagnosis not present

## 2017-10-17 DIAGNOSIS — M6281 Muscle weakness (generalized): Secondary | ICD-10-CM | POA: Diagnosis not present

## 2017-10-17 DIAGNOSIS — R2689 Other abnormalities of gait and mobility: Secondary | ICD-10-CM | POA: Diagnosis not present

## 2017-10-21 DIAGNOSIS — M5137 Other intervertebral disc degeneration, lumbosacral region: Secondary | ICD-10-CM | POA: Diagnosis not present

## 2017-10-21 DIAGNOSIS — M5136 Other intervertebral disc degeneration, lumbar region: Secondary | ICD-10-CM | POA: Diagnosis not present

## 2017-10-21 DIAGNOSIS — M48062 Spinal stenosis, lumbar region with neurogenic claudication: Secondary | ICD-10-CM | POA: Diagnosis not present

## 2017-10-21 DIAGNOSIS — M6281 Muscle weakness (generalized): Secondary | ICD-10-CM | POA: Diagnosis not present

## 2017-10-21 DIAGNOSIS — G629 Polyneuropathy, unspecified: Secondary | ICD-10-CM | POA: Diagnosis not present

## 2017-10-21 DIAGNOSIS — L89301 Pressure ulcer of unspecified buttock, stage 1: Secondary | ICD-10-CM | POA: Diagnosis not present

## 2017-10-21 DIAGNOSIS — R2689 Other abnormalities of gait and mobility: Secondary | ICD-10-CM | POA: Diagnosis not present

## 2017-10-21 DIAGNOSIS — R269 Unspecified abnormalities of gait and mobility: Secondary | ICD-10-CM | POA: Diagnosis not present

## 2017-10-23 DIAGNOSIS — I10 Essential (primary) hypertension: Secondary | ICD-10-CM | POA: Diagnosis not present

## 2017-10-23 DIAGNOSIS — L89154 Pressure ulcer of sacral region, stage 4: Secondary | ICD-10-CM | POA: Diagnosis not present

## 2017-10-23 DIAGNOSIS — K5900 Constipation, unspecified: Secondary | ICD-10-CM | POA: Diagnosis not present

## 2017-10-23 DIAGNOSIS — G8929 Other chronic pain: Secondary | ICD-10-CM | POA: Diagnosis not present

## 2017-10-24 DIAGNOSIS — Z658 Other specified problems related to psychosocial circumstances: Secondary | ICD-10-CM | POA: Diagnosis not present

## 2017-10-24 DIAGNOSIS — F411 Generalized anxiety disorder: Secondary | ICD-10-CM | POA: Diagnosis not present

## 2017-10-24 DIAGNOSIS — F329 Major depressive disorder, single episode, unspecified: Secondary | ICD-10-CM | POA: Diagnosis not present

## 2017-10-26 ENCOUNTER — Other Ambulatory Visit: Payer: Self-pay | Admitting: *Deleted

## 2017-10-26 NOTE — Patient Outreach (Signed)
Called [name] to get an update on Theresa Flowers. I had to leave a voice mail for him to return my call as he did not answer.  [name]. [name], MSN, [facility] Gerontological Nurse Practitioner [facility] Care Management [phone]

## 2017-11-14 DIAGNOSIS — D649 Anemia, unspecified: Secondary | ICD-10-CM | POA: Diagnosis not present

## 2017-11-14 DIAGNOSIS — R2689 Other abnormalities of gait and mobility: Secondary | ICD-10-CM | POA: Diagnosis not present

## 2017-11-14 DIAGNOSIS — E559 Vitamin D deficiency, unspecified: Secondary | ICD-10-CM | POA: Diagnosis not present

## 2017-11-14 DIAGNOSIS — G629 Polyneuropathy, unspecified: Secondary | ICD-10-CM | POA: Diagnosis not present

## 2017-11-14 DIAGNOSIS — M5136 Other intervertebral disc degeneration, lumbar region: Secondary | ICD-10-CM | POA: Diagnosis not present

## 2017-11-14 DIAGNOSIS — L89154 Pressure ulcer of sacral region, stage 4: Secondary | ICD-10-CM | POA: Diagnosis not present

## 2017-11-14 DIAGNOSIS — R269 Unspecified abnormalities of gait and mobility: Secondary | ICD-10-CM | POA: Diagnosis not present

## 2017-11-14 DIAGNOSIS — N319 Neuromuscular dysfunction of bladder, unspecified: Secondary | ICD-10-CM | POA: Diagnosis not present

## 2017-11-14 DIAGNOSIS — M6281 Muscle weakness (generalized): Secondary | ICD-10-CM | POA: Diagnosis not present

## 2017-11-18 DIAGNOSIS — M6281 Muscle weakness (generalized): Secondary | ICD-10-CM | POA: Diagnosis not present

## 2017-11-18 DIAGNOSIS — G629 Polyneuropathy, unspecified: Secondary | ICD-10-CM | POA: Diagnosis not present

## 2017-11-18 DIAGNOSIS — M5136 Other intervertebral disc degeneration, lumbar region: Secondary | ICD-10-CM | POA: Diagnosis not present

## 2017-11-18 DIAGNOSIS — R269 Unspecified abnormalities of gait and mobility: Secondary | ICD-10-CM | POA: Diagnosis not present

## 2017-11-18 DIAGNOSIS — R2689 Other abnormalities of gait and mobility: Secondary | ICD-10-CM | POA: Diagnosis not present

## 2017-11-18 DIAGNOSIS — M5137 Other intervertebral disc degeneration, lumbosacral region: Secondary | ICD-10-CM | POA: Diagnosis not present

## 2017-11-18 DIAGNOSIS — L89301 Pressure ulcer of unspecified buttock, stage 1: Secondary | ICD-10-CM | POA: Diagnosis not present

## 2017-11-18 DIAGNOSIS — M48062 Spinal stenosis, lumbar region with neurogenic claudication: Secondary | ICD-10-CM | POA: Diagnosis not present

## 2017-11-19 DIAGNOSIS — Z658 Other specified problems related to psychosocial circumstances: Secondary | ICD-10-CM | POA: Diagnosis not present

## 2017-11-19 DIAGNOSIS — F329 Major depressive disorder, single episode, unspecified: Secondary | ICD-10-CM | POA: Diagnosis not present

## 2017-11-19 DIAGNOSIS — F411 Generalized anxiety disorder: Secondary | ICD-10-CM | POA: Diagnosis not present

## 2017-11-20 DIAGNOSIS — T83511A Infection and inflammatory reaction due to indwelling urethral catheter, initial encounter: Secondary | ICD-10-CM | POA: Diagnosis not present

## 2017-11-20 DIAGNOSIS — L89300 Pressure ulcer of unspecified buttock, unstageable: Secondary | ICD-10-CM | POA: Diagnosis not present

## 2017-11-20 DIAGNOSIS — Z8249 Family history of ischemic heart disease and other diseases of the circulatory system: Secondary | ICD-10-CM | POA: Diagnosis not present

## 2017-11-20 DIAGNOSIS — R030 Elevated blood-pressure reading, without diagnosis of hypertension: Secondary | ICD-10-CM | POA: Diagnosis not present

## 2017-11-20 DIAGNOSIS — I10 Essential (primary) hypertension: Secondary | ICD-10-CM | POA: Diagnosis not present

## 2017-11-20 DIAGNOSIS — H538 Other visual disturbances: Secondary | ICD-10-CM | POA: Diagnosis not present

## 2017-11-20 DIAGNOSIS — Z7401 Bed confinement status: Secondary | ICD-10-CM | POA: Diagnosis not present

## 2017-11-20 DIAGNOSIS — R531 Weakness: Secondary | ICD-10-CM | POA: Diagnosis not present

## 2017-11-20 DIAGNOSIS — N390 Urinary tract infection, site not specified: Secondary | ICD-10-CM | POA: Diagnosis not present

## 2017-11-20 DIAGNOSIS — I674 Hypertensive encephalopathy: Secondary | ICD-10-CM | POA: Diagnosis not present

## 2017-11-20 DIAGNOSIS — Z90710 Acquired absence of both cervix and uterus: Secondary | ICD-10-CM | POA: Diagnosis not present

## 2017-11-20 DIAGNOSIS — L89150 Pressure ulcer of sacral region, unstageable: Secondary | ICD-10-CM | POA: Diagnosis not present

## 2017-11-20 DIAGNOSIS — I160 Hypertensive urgency: Secondary | ICD-10-CM | POA: Diagnosis not present

## 2017-11-20 DIAGNOSIS — I4510 Unspecified right bundle-branch block: Secondary | ICD-10-CM | POA: Diagnosis not present

## 2017-11-20 DIAGNOSIS — Z806 Family history of leukemia: Secondary | ICD-10-CM | POA: Diagnosis not present

## 2017-11-20 DIAGNOSIS — R4182 Altered mental status, unspecified: Secondary | ICD-10-CM | POA: Diagnosis not present

## 2017-11-20 DIAGNOSIS — Z823 Family history of stroke: Secondary | ICD-10-CM | POA: Diagnosis not present

## 2017-11-20 DIAGNOSIS — Z803 Family history of malignant neoplasm of breast: Secondary | ICD-10-CM | POA: Diagnosis not present

## 2017-11-20 DIAGNOSIS — E785 Hyperlipidemia, unspecified: Secondary | ICD-10-CM | POA: Diagnosis not present

## 2017-11-20 DIAGNOSIS — L89304 Pressure ulcer of unspecified buttock, stage 4: Secondary | ICD-10-CM | POA: Diagnosis not present

## 2017-11-20 DIAGNOSIS — G9340 Encephalopathy, unspecified: Secondary | ICD-10-CM | POA: Diagnosis not present

## 2017-11-20 DIAGNOSIS — R400 Somnolence: Secondary | ICD-10-CM | POA: Diagnosis not present

## 2017-11-20 DIAGNOSIS — Z85828 Personal history of other malignant neoplasm of skin: Secondary | ICD-10-CM | POA: Diagnosis not present

## 2017-11-26 DIAGNOSIS — I10 Essential (primary) hypertension: Secondary | ICD-10-CM | POA: Diagnosis not present

## 2017-11-26 DIAGNOSIS — R2681 Unsteadiness on feet: Secondary | ICD-10-CM | POA: Diagnosis not present

## 2017-11-26 DIAGNOSIS — N319 Neuromuscular dysfunction of bladder, unspecified: Secondary | ICD-10-CM | POA: Diagnosis not present

## 2017-11-26 DIAGNOSIS — R269 Unspecified abnormalities of gait and mobility: Secondary | ICD-10-CM | POA: Diagnosis not present

## 2017-11-26 DIAGNOSIS — N390 Urinary tract infection, site not specified: Secondary | ICD-10-CM | POA: Diagnosis not present

## 2017-11-26 DIAGNOSIS — L89154 Pressure ulcer of sacral region, stage 4: Secondary | ICD-10-CM | POA: Diagnosis not present

## 2017-11-26 DIAGNOSIS — M6281 Muscle weakness (generalized): Secondary | ICD-10-CM | POA: Diagnosis not present

## 2017-11-26 DIAGNOSIS — G9340 Encephalopathy, unspecified: Secondary | ICD-10-CM | POA: Diagnosis not present

## 2017-11-26 DIAGNOSIS — R2689 Other abnormalities of gait and mobility: Secondary | ICD-10-CM | POA: Diagnosis not present

## 2017-11-28 DIAGNOSIS — L89154 Pressure ulcer of sacral region, stage 4: Secondary | ICD-10-CM | POA: Diagnosis not present

## 2017-11-28 DIAGNOSIS — I10 Essential (primary) hypertension: Secondary | ICD-10-CM | POA: Diagnosis not present

## 2017-11-28 DIAGNOSIS — R5381 Other malaise: Secondary | ICD-10-CM | POA: Diagnosis not present

## 2017-11-28 DIAGNOSIS — N390 Urinary tract infection, site not specified: Secondary | ICD-10-CM | POA: Diagnosis not present

## 2017-12-03 DIAGNOSIS — L89154 Pressure ulcer of sacral region, stage 4: Secondary | ICD-10-CM | POA: Diagnosis not present

## 2017-12-03 DIAGNOSIS — R2681 Unsteadiness on feet: Secondary | ICD-10-CM | POA: Diagnosis not present

## 2017-12-03 DIAGNOSIS — N319 Neuromuscular dysfunction of bladder, unspecified: Secondary | ICD-10-CM | POA: Diagnosis not present

## 2017-12-03 DIAGNOSIS — M6281 Muscle weakness (generalized): Secondary | ICD-10-CM | POA: Diagnosis not present

## 2017-12-03 DIAGNOSIS — R269 Unspecified abnormalities of gait and mobility: Secondary | ICD-10-CM | POA: Diagnosis not present

## 2017-12-11 DIAGNOSIS — D649 Anemia, unspecified: Secondary | ICD-10-CM | POA: Diagnosis not present

## 2017-12-11 DIAGNOSIS — I10 Essential (primary) hypertension: Secondary | ICD-10-CM | POA: Diagnosis not present

## 2017-12-13 DIAGNOSIS — D649 Anemia, unspecified: Secondary | ICD-10-CM | POA: Diagnosis not present

## 2017-12-13 DIAGNOSIS — I10 Essential (primary) hypertension: Secondary | ICD-10-CM | POA: Diagnosis not present

## 2017-12-13 DIAGNOSIS — L89154 Pressure ulcer of sacral region, stage 4: Secondary | ICD-10-CM | POA: Diagnosis not present

## 2017-12-13 DIAGNOSIS — K5900 Constipation, unspecified: Secondary | ICD-10-CM | POA: Diagnosis not present

## 2017-12-14 DIAGNOSIS — E559 Vitamin D deficiency, unspecified: Secondary | ICD-10-CM | POA: Diagnosis not present

## 2017-12-14 DIAGNOSIS — I10 Essential (primary) hypertension: Secondary | ICD-10-CM | POA: Diagnosis not present

## 2017-12-14 DIAGNOSIS — N319 Neuromuscular dysfunction of bladder, unspecified: Secondary | ICD-10-CM | POA: Diagnosis not present

## 2017-12-15 DIAGNOSIS — M6281 Muscle weakness (generalized): Secondary | ICD-10-CM | POA: Diagnosis not present

## 2017-12-15 DIAGNOSIS — M5136 Other intervertebral disc degeneration, lumbar region: Secondary | ICD-10-CM | POA: Diagnosis not present

## 2017-12-15 DIAGNOSIS — R269 Unspecified abnormalities of gait and mobility: Secondary | ICD-10-CM | POA: Diagnosis not present

## 2017-12-15 DIAGNOSIS — R2689 Other abnormalities of gait and mobility: Secondary | ICD-10-CM | POA: Diagnosis not present

## 2017-12-15 DIAGNOSIS — G629 Polyneuropathy, unspecified: Secondary | ICD-10-CM | POA: Diagnosis not present

## 2017-12-19 DIAGNOSIS — R2689 Other abnormalities of gait and mobility: Secondary | ICD-10-CM | POA: Diagnosis not present

## 2017-12-19 DIAGNOSIS — G629 Polyneuropathy, unspecified: Secondary | ICD-10-CM | POA: Diagnosis not present

## 2017-12-19 DIAGNOSIS — M48062 Spinal stenosis, lumbar region with neurogenic claudication: Secondary | ICD-10-CM | POA: Diagnosis not present

## 2017-12-19 DIAGNOSIS — L89301 Pressure ulcer of unspecified buttock, stage 1: Secondary | ICD-10-CM | POA: Diagnosis not present

## 2017-12-19 DIAGNOSIS — M5137 Other intervertebral disc degeneration, lumbosacral region: Secondary | ICD-10-CM | POA: Diagnosis not present

## 2017-12-19 DIAGNOSIS — M6281 Muscle weakness (generalized): Secondary | ICD-10-CM | POA: Diagnosis not present

## 2017-12-19 DIAGNOSIS — R269 Unspecified abnormalities of gait and mobility: Secondary | ICD-10-CM | POA: Diagnosis not present

## 2017-12-19 DIAGNOSIS — M5136 Other intervertebral disc degeneration, lumbar region: Secondary | ICD-10-CM | POA: Diagnosis not present

## 2017-12-24 DIAGNOSIS — F411 Generalized anxiety disorder: Secondary | ICD-10-CM | POA: Diagnosis not present

## 2017-12-24 DIAGNOSIS — F329 Major depressive disorder, single episode, unspecified: Secondary | ICD-10-CM | POA: Diagnosis not present

## 2018-01-11 DIAGNOSIS — I10 Essential (primary) hypertension: Secondary | ICD-10-CM | POA: Diagnosis not present

## 2018-01-11 DIAGNOSIS — G8929 Other chronic pain: Secondary | ICD-10-CM | POA: Diagnosis not present

## 2018-01-11 DIAGNOSIS — N319 Neuromuscular dysfunction of bladder, unspecified: Secondary | ICD-10-CM | POA: Diagnosis not present

## 2018-01-11 DIAGNOSIS — E559 Vitamin D deficiency, unspecified: Secondary | ICD-10-CM | POA: Diagnosis not present

## 2018-01-14 DIAGNOSIS — G629 Polyneuropathy, unspecified: Secondary | ICD-10-CM | POA: Diagnosis not present

## 2018-01-14 DIAGNOSIS — M5136 Other intervertebral disc degeneration, lumbar region: Secondary | ICD-10-CM | POA: Diagnosis not present

## 2018-01-14 DIAGNOSIS — R2689 Other abnormalities of gait and mobility: Secondary | ICD-10-CM | POA: Diagnosis not present

## 2018-01-14 DIAGNOSIS — R269 Unspecified abnormalities of gait and mobility: Secondary | ICD-10-CM | POA: Diagnosis not present

## 2018-01-14 DIAGNOSIS — M6281 Muscle weakness (generalized): Secondary | ICD-10-CM | POA: Diagnosis not present

## 2018-01-18 ENCOUNTER — Other Ambulatory Visit: Payer: Self-pay | Admitting: *Deleted

## 2018-01-18 DIAGNOSIS — M5137 Other intervertebral disc degeneration, lumbosacral region: Secondary | ICD-10-CM | POA: Diagnosis not present

## 2018-01-18 DIAGNOSIS — M48062 Spinal stenosis, lumbar region with neurogenic claudication: Secondary | ICD-10-CM | POA: Diagnosis not present

## 2018-01-18 DIAGNOSIS — L209 Atopic dermatitis, unspecified: Secondary | ICD-10-CM | POA: Diagnosis not present

## 2018-01-18 DIAGNOSIS — G629 Polyneuropathy, unspecified: Secondary | ICD-10-CM | POA: Diagnosis not present

## 2018-01-18 DIAGNOSIS — R269 Unspecified abnormalities of gait and mobility: Secondary | ICD-10-CM | POA: Diagnosis not present

## 2018-01-18 DIAGNOSIS — M6281 Muscle weakness (generalized): Secondary | ICD-10-CM | POA: Diagnosis not present

## 2018-01-18 DIAGNOSIS — L89301 Pressure ulcer of unspecified buttock, stage 1: Secondary | ICD-10-CM | POA: Diagnosis not present

## 2018-01-18 DIAGNOSIS — M5136 Other intervertebral disc degeneration, lumbar region: Secondary | ICD-10-CM | POA: Diagnosis not present

## 2018-01-18 DIAGNOSIS — R2689 Other abnormalities of gait and mobility: Secondary | ICD-10-CM | POA: Diagnosis not present

## 2018-01-18 NOTE — Patient Outreach (Signed)
Telephone call to pt's husband, Theresa Flowers, to follow up on pt progress in SNF with wound care and potential for discharge home. Theresa Flowers reports Theresa Flowers is doing well, improving daily. Her wound is better, getting smaller. [name] stands daily in a standing apparatus for about an hour. She is in good spirits and no problems that have sent her to the ED or to have an admission.  Theresa Flowers says he needs to get a ramp installed and Theresa Flowers won't be discharged home until that is done. They are still planning on using the same area of the house where she was previously but says they will rearrange things and move things out to make a better living space. I have told Theresa Flowers this, months ago, and he admits they have not followed through. He thinks she may be able to come home in June. I encouraged him that the ramp needs to be a priority then.  I will call in a month to follow up.  [name]. [name], MSN, [facility] Gerontological Nurse Practitioner [facility] Care Management [phone]

## 2018-01-21 DIAGNOSIS — L89154 Pressure ulcer of sacral region, stage 4: Secondary | ICD-10-CM | POA: Diagnosis not present

## 2018-01-21 DIAGNOSIS — G8929 Other chronic pain: Secondary | ICD-10-CM | POA: Diagnosis not present

## 2018-01-21 DIAGNOSIS — I10 Essential (primary) hypertension: Secondary | ICD-10-CM | POA: Diagnosis not present

## 2018-01-23 DIAGNOSIS — M6281 Muscle weakness (generalized): Secondary | ICD-10-CM | POA: Diagnosis not present

## 2018-02-04 DIAGNOSIS — R269 Unspecified abnormalities of gait and mobility: Secondary | ICD-10-CM | POA: Diagnosis not present

## 2018-02-04 DIAGNOSIS — M6281 Muscle weakness (generalized): Secondary | ICD-10-CM | POA: Diagnosis not present

## 2018-02-04 DIAGNOSIS — N319 Neuromuscular dysfunction of bladder, unspecified: Secondary | ICD-10-CM | POA: Diagnosis not present

## 2018-02-04 DIAGNOSIS — L89154 Pressure ulcer of sacral region, stage 4: Secondary | ICD-10-CM | POA: Diagnosis not present

## 2018-02-08 DIAGNOSIS — N319 Neuromuscular dysfunction of bladder, unspecified: Secondary | ICD-10-CM | POA: Diagnosis not present

## 2018-02-08 DIAGNOSIS — I10 Essential (primary) hypertension: Secondary | ICD-10-CM | POA: Diagnosis not present

## 2018-02-08 DIAGNOSIS — G8929 Other chronic pain: Secondary | ICD-10-CM | POA: Diagnosis not present

## 2018-02-08 DIAGNOSIS — M62838 Other muscle spasm: Secondary | ICD-10-CM | POA: Diagnosis not present

## 2018-02-14 DIAGNOSIS — M5136 Other intervertebral disc degeneration, lumbar region: Secondary | ICD-10-CM | POA: Diagnosis not present

## 2018-02-14 DIAGNOSIS — R269 Unspecified abnormalities of gait and mobility: Secondary | ICD-10-CM | POA: Diagnosis not present

## 2018-02-14 DIAGNOSIS — M6281 Muscle weakness (generalized): Secondary | ICD-10-CM | POA: Diagnosis not present

## 2018-02-14 DIAGNOSIS — G629 Polyneuropathy, unspecified: Secondary | ICD-10-CM | POA: Diagnosis not present

## 2018-02-14 DIAGNOSIS — R2689 Other abnormalities of gait and mobility: Secondary | ICD-10-CM | POA: Diagnosis not present

## 2018-02-18 ENCOUNTER — Other Ambulatory Visit: Payer: Self-pay | Admitting: *Deleted

## 2018-02-18 DIAGNOSIS — M48062 Spinal stenosis, lumbar region with neurogenic claudication: Secondary | ICD-10-CM | POA: Diagnosis not present

## 2018-02-18 DIAGNOSIS — M5137 Other intervertebral disc degeneration, lumbosacral region: Secondary | ICD-10-CM | POA: Diagnosis not present

## 2018-02-18 DIAGNOSIS — L89301 Pressure ulcer of unspecified buttock, stage 1: Secondary | ICD-10-CM | POA: Diagnosis not present

## 2018-02-18 DIAGNOSIS — R2689 Other abnormalities of gait and mobility: Secondary | ICD-10-CM | POA: Diagnosis not present

## 2018-02-18 DIAGNOSIS — M6281 Muscle weakness (generalized): Secondary | ICD-10-CM | POA: Diagnosis not present

## 2018-02-18 DIAGNOSIS — M5136 Other intervertebral disc degeneration, lumbar region: Secondary | ICD-10-CM | POA: Diagnosis not present

## 2018-02-18 DIAGNOSIS — R269 Unspecified abnormalities of gait and mobility: Secondary | ICD-10-CM | POA: Diagnosis not present

## 2018-02-18 DIAGNOSIS — G629 Polyneuropathy, unspecified: Secondary | ICD-10-CM | POA: Diagnosis not present

## 2018-02-18 NOTE — Patient Outreach (Signed)
Telephone outreach to follow up on SNF stay progress. Theresa Flowers was not home, I left a message and requested a return call.  [name]. [name], MSN, [facility] Gerontological Nurse Practitioner [facility] Care Management [phone]

## 2018-02-19 DIAGNOSIS — L89154 Pressure ulcer of sacral region, stage 4: Secondary | ICD-10-CM | POA: Diagnosis not present

## 2018-02-19 DIAGNOSIS — I10 Essential (primary) hypertension: Secondary | ICD-10-CM | POA: Diagnosis not present

## 2018-02-19 DIAGNOSIS — G629 Polyneuropathy, unspecified: Secondary | ICD-10-CM | POA: Diagnosis not present

## 2018-02-19 DIAGNOSIS — G8929 Other chronic pain: Secondary | ICD-10-CM | POA: Diagnosis not present

## 2018-03-04 DIAGNOSIS — F444 Conversion disorder with motor symptom or deficit: Secondary | ICD-10-CM | POA: Diagnosis not present

## 2018-03-04 DIAGNOSIS — L89154 Pressure ulcer of sacral region, stage 4: Secondary | ICD-10-CM | POA: Diagnosis not present

## 2018-03-04 DIAGNOSIS — I10 Essential (primary) hypertension: Secondary | ICD-10-CM | POA: Diagnosis not present

## 2018-03-04 DIAGNOSIS — L905 Scar conditions and fibrosis of skin: Secondary | ICD-10-CM | POA: Diagnosis not present

## 2018-03-08 DIAGNOSIS — N319 Neuromuscular dysfunction of bladder, unspecified: Secondary | ICD-10-CM | POA: Diagnosis not present

## 2018-03-08 DIAGNOSIS — L89154 Pressure ulcer of sacral region, stage 4: Secondary | ICD-10-CM | POA: Diagnosis not present

## 2018-03-08 DIAGNOSIS — I10 Essential (primary) hypertension: Secondary | ICD-10-CM | POA: Diagnosis not present

## 2018-03-12 DIAGNOSIS — M6281 Muscle weakness (generalized): Secondary | ICD-10-CM | POA: Diagnosis not present

## 2018-03-12 DIAGNOSIS — D649 Anemia, unspecified: Secondary | ICD-10-CM | POA: Diagnosis not present

## 2018-03-12 DIAGNOSIS — I10 Essential (primary) hypertension: Secondary | ICD-10-CM | POA: Diagnosis not present

## 2018-03-12 DIAGNOSIS — R269 Unspecified abnormalities of gait and mobility: Secondary | ICD-10-CM | POA: Diagnosis not present

## 2018-03-12 DIAGNOSIS — L89154 Pressure ulcer of sacral region, stage 4: Secondary | ICD-10-CM | POA: Diagnosis not present

## 2018-03-16 DIAGNOSIS — G629 Polyneuropathy, unspecified: Secondary | ICD-10-CM | POA: Diagnosis not present

## 2018-03-16 DIAGNOSIS — M5136 Other intervertebral disc degeneration, lumbar region: Secondary | ICD-10-CM | POA: Diagnosis not present

## 2018-03-16 DIAGNOSIS — M6281 Muscle weakness (generalized): Secondary | ICD-10-CM | POA: Diagnosis not present

## 2018-03-16 DIAGNOSIS — R2689 Other abnormalities of gait and mobility: Secondary | ICD-10-CM | POA: Diagnosis not present

## 2018-03-16 DIAGNOSIS — R269 Unspecified abnormalities of gait and mobility: Secondary | ICD-10-CM | POA: Diagnosis not present

## 2018-03-20 DIAGNOSIS — L89301 Pressure ulcer of unspecified buttock, stage 1: Secondary | ICD-10-CM | POA: Diagnosis not present

## 2018-03-20 DIAGNOSIS — M5136 Other intervertebral disc degeneration, lumbar region: Secondary | ICD-10-CM | POA: Diagnosis not present

## 2018-03-20 DIAGNOSIS — R269 Unspecified abnormalities of gait and mobility: Secondary | ICD-10-CM | POA: Diagnosis not present

## 2018-03-20 DIAGNOSIS — M48062 Spinal stenosis, lumbar region with neurogenic claudication: Secondary | ICD-10-CM | POA: Diagnosis not present

## 2018-03-20 DIAGNOSIS — M5137 Other intervertebral disc degeneration, lumbosacral region: Secondary | ICD-10-CM | POA: Diagnosis not present

## 2018-03-20 DIAGNOSIS — M6281 Muscle weakness (generalized): Secondary | ICD-10-CM | POA: Diagnosis not present

## 2018-03-20 DIAGNOSIS — R2689 Other abnormalities of gait and mobility: Secondary | ICD-10-CM | POA: Diagnosis not present

## 2018-03-20 DIAGNOSIS — G629 Polyneuropathy, unspecified: Secondary | ICD-10-CM | POA: Diagnosis not present

## 2018-03-25 DIAGNOSIS — R8290 Unspecified abnormal findings in urine: Secondary | ICD-10-CM | POA: Diagnosis not present

## 2018-03-25 DIAGNOSIS — N319 Neuromuscular dysfunction of bladder, unspecified: Secondary | ICD-10-CM | POA: Diagnosis not present

## 2018-03-25 DIAGNOSIS — R339 Retention of urine, unspecified: Secondary | ICD-10-CM | POA: Diagnosis not present

## 2018-04-03 DIAGNOSIS — N390 Urinary tract infection, site not specified: Secondary | ICD-10-CM | POA: Diagnosis not present

## 2018-04-05 DIAGNOSIS — N319 Neuromuscular dysfunction of bladder, unspecified: Secondary | ICD-10-CM | POA: Diagnosis not present

## 2018-04-05 DIAGNOSIS — N390 Urinary tract infection, site not specified: Secondary | ICD-10-CM | POA: Diagnosis not present

## 2018-04-05 DIAGNOSIS — D649 Anemia, unspecified: Secondary | ICD-10-CM | POA: Diagnosis not present

## 2018-04-05 DIAGNOSIS — R339 Retention of urine, unspecified: Secondary | ICD-10-CM | POA: Diagnosis not present

## 2018-04-11 DIAGNOSIS — Z960 Presence of urogenital implants: Secondary | ICD-10-CM | POA: Diagnosis not present

## 2018-04-11 DIAGNOSIS — T83511D Infection and inflammatory reaction due to indwelling urethral catheter, subsequent encounter: Secondary | ICD-10-CM | POA: Diagnosis not present

## 2018-04-11 DIAGNOSIS — N329 Bladder disorder, unspecified: Secondary | ICD-10-CM | POA: Diagnosis not present

## 2018-04-11 DIAGNOSIS — B9689 Other specified bacterial agents as the cause of diseases classified elsewhere: Secondary | ICD-10-CM | POA: Diagnosis not present

## 2018-04-11 DIAGNOSIS — N390 Urinary tract infection, site not specified: Secondary | ICD-10-CM | POA: Diagnosis not present

## 2018-04-11 DIAGNOSIS — R339 Retention of urine, unspecified: Secondary | ICD-10-CM | POA: Diagnosis not present

## 2018-04-11 DIAGNOSIS — N399 Disorder of urinary system, unspecified: Secondary | ICD-10-CM | POA: Diagnosis not present

## 2018-04-11 DIAGNOSIS — B965 Pseudomonas (aeruginosa) (mallei) (pseudomallei) as the cause of diseases classified elsewhere: Secondary | ICD-10-CM | POA: Diagnosis not present

## 2018-04-11 DIAGNOSIS — N319 Neuromuscular dysfunction of bladder, unspecified: Secondary | ICD-10-CM | POA: Diagnosis not present

## 2018-04-19 ENCOUNTER — Other Ambulatory Visit: Payer: Self-pay | Admitting: *Deleted

## 2018-04-19 NOTE — Patient Outreach (Signed)
Telephone outreach to pt's husband, Theresa Flowers. We discussed pt's disposition and plan of care. Mr. Kolarik reports, Theresa Flowers is doing fine. She did have a hospitalization in [facility] for wound debridement on 03/04/18. The surgeon is now considering bringing in a plastic surgeon to do further surgical repair.  John reports that she is in good spirits and still looking forward to coming home in the future.  He reports he has not installed a ramp as of yet which we discussed needing to be done back in May. He says Iver's sister who lives in another state has been talking about getting a mobile ramp. I discussed safety issues with some of these products. I am sending him information for safety guidelines and a resource ([name]) for building or installing.  I have advised John that I am going to close [name]'s case as she has been in LTC for 10 months and discharge home is still not in sight. I have advised that when they do get a discharge plan that he call me so that I can request our community care manager that serves the [place] area to engage.  He expressed his appreciation for [facility] services.  [name]. [name], MSN, [facility] Gerontological Nurse Practitioner [facility] Care Management [phone]

## 2018-04-20 DIAGNOSIS — L89301 Pressure ulcer of unspecified buttock, stage 1: Secondary | ICD-10-CM | POA: Diagnosis not present

## 2018-04-20 DIAGNOSIS — R269 Unspecified abnormalities of gait and mobility: Secondary | ICD-10-CM | POA: Diagnosis not present

## 2018-04-20 DIAGNOSIS — M5137 Other intervertebral disc degeneration, lumbosacral region: Secondary | ICD-10-CM | POA: Diagnosis not present

## 2018-04-20 DIAGNOSIS — G629 Polyneuropathy, unspecified: Secondary | ICD-10-CM | POA: Diagnosis not present

## 2018-04-20 DIAGNOSIS — M6281 Muscle weakness (generalized): Secondary | ICD-10-CM | POA: Diagnosis not present

## 2018-04-20 DIAGNOSIS — M5136 Other intervertebral disc degeneration, lumbar region: Secondary | ICD-10-CM | POA: Diagnosis not present

## 2018-04-20 DIAGNOSIS — R2689 Other abnormalities of gait and mobility: Secondary | ICD-10-CM | POA: Diagnosis not present

## 2018-04-20 DIAGNOSIS — M48062 Spinal stenosis, lumbar region with neurogenic claudication: Secondary | ICD-10-CM | POA: Diagnosis not present

## 2018-04-23 DIAGNOSIS — N319 Neuromuscular dysfunction of bladder, unspecified: Secondary | ICD-10-CM | POA: Diagnosis not present

## 2018-04-23 DIAGNOSIS — R911 Solitary pulmonary nodule: Secondary | ICD-10-CM | POA: Diagnosis not present

## 2018-04-23 DIAGNOSIS — N390 Urinary tract infection, site not specified: Secondary | ICD-10-CM | POA: Diagnosis not present

## 2018-04-23 DIAGNOSIS — I700 Atherosclerosis of aorta: Secondary | ICD-10-CM | POA: Diagnosis not present

## 2018-04-23 DIAGNOSIS — R339 Retention of urine, unspecified: Secondary | ICD-10-CM | POA: Diagnosis not present

## 2018-04-23 DIAGNOSIS — K449 Diaphragmatic hernia without obstruction or gangrene: Secondary | ICD-10-CM | POA: Diagnosis not present

## 2018-04-23 DIAGNOSIS — N8110 Cystocele, unspecified: Secondary | ICD-10-CM | POA: Diagnosis not present

## 2018-04-23 DIAGNOSIS — N281 Cyst of kidney, acquired: Secondary | ICD-10-CM | POA: Diagnosis not present

## 2018-04-23 DIAGNOSIS — K8020 Calculus of gallbladder without cholecystitis without obstruction: Secondary | ICD-10-CM | POA: Diagnosis not present

## 2018-04-23 DIAGNOSIS — N329 Bladder disorder, unspecified: Secondary | ICD-10-CM | POA: Diagnosis not present

## 2018-04-23 DIAGNOSIS — R338 Other retention of urine: Secondary | ICD-10-CM | POA: Diagnosis not present

## 2018-04-23 DIAGNOSIS — T83511D Infection and inflammatory reaction due to indwelling urethral catheter, subsequent encounter: Secondary | ICD-10-CM | POA: Diagnosis not present

## 2018-04-23 DIAGNOSIS — N200 Calculus of kidney: Secondary | ICD-10-CM | POA: Diagnosis not present

## 2018-04-23 DIAGNOSIS — L89159 Pressure ulcer of sacral region, unspecified stage: Secondary | ICD-10-CM | POA: Diagnosis not present

## 2018-04-29 DIAGNOSIS — L89154 Pressure ulcer of sacral region, stage 4: Secondary | ICD-10-CM | POA: Diagnosis not present

## 2018-04-29 DIAGNOSIS — L89159 Pressure ulcer of sacral region, unspecified stage: Secondary | ICD-10-CM | POA: Diagnosis not present

## 2018-05-03 DIAGNOSIS — L89154 Pressure ulcer of sacral region, stage 4: Secondary | ICD-10-CM | POA: Diagnosis not present

## 2018-05-03 DIAGNOSIS — I10 Essential (primary) hypertension: Secondary | ICD-10-CM | POA: Diagnosis not present

## 2018-05-03 DIAGNOSIS — G8929 Other chronic pain: Secondary | ICD-10-CM | POA: Diagnosis not present

## 2018-05-06 DIAGNOSIS — I10 Essential (primary) hypertension: Secondary | ICD-10-CM | POA: Diagnosis not present

## 2018-05-06 DIAGNOSIS — L89154 Pressure ulcer of sacral region, stage 4: Secondary | ICD-10-CM | POA: Diagnosis not present

## 2018-05-06 DIAGNOSIS — N319 Neuromuscular dysfunction of bladder, unspecified: Secondary | ICD-10-CM | POA: Diagnosis not present

## 2018-05-06 DIAGNOSIS — G8929 Other chronic pain: Secondary | ICD-10-CM | POA: Diagnosis not present

## 2018-05-20 DIAGNOSIS — I10 Essential (primary) hypertension: Secondary | ICD-10-CM | POA: Diagnosis not present

## 2018-05-20 DIAGNOSIS — D649 Anemia, unspecified: Secondary | ICD-10-CM | POA: Diagnosis not present

## 2018-05-21 DIAGNOSIS — F411 Generalized anxiety disorder: Secondary | ICD-10-CM | POA: Diagnosis not present

## 2018-05-21 DIAGNOSIS — L89154 Pressure ulcer of sacral region, stage 4: Secondary | ICD-10-CM | POA: Diagnosis not present

## 2018-05-21 DIAGNOSIS — I10 Essential (primary) hypertension: Secondary | ICD-10-CM | POA: Diagnosis not present

## 2018-06-03 DIAGNOSIS — I10 Essential (primary) hypertension: Secondary | ICD-10-CM | POA: Diagnosis not present

## 2018-06-03 DIAGNOSIS — N319 Neuromuscular dysfunction of bladder, unspecified: Secondary | ICD-10-CM | POA: Diagnosis not present

## 2018-06-03 DIAGNOSIS — R339 Retention of urine, unspecified: Secondary | ICD-10-CM | POA: Diagnosis not present

## 2018-06-03 DIAGNOSIS — D649 Anemia, unspecified: Secondary | ICD-10-CM | POA: Diagnosis not present

## 2018-06-03 DIAGNOSIS — N329 Bladder disorder, unspecified: Secondary | ICD-10-CM | POA: Diagnosis not present

## 2018-06-03 DIAGNOSIS — N390 Urinary tract infection, site not specified: Secondary | ICD-10-CM | POA: Diagnosis not present

## 2018-06-03 DIAGNOSIS — L89154 Pressure ulcer of sacral region, stage 4: Secondary | ICD-10-CM | POA: Diagnosis not present

## 2018-06-03 DIAGNOSIS — T83511D Infection and inflammatory reaction due to indwelling urethral catheter, subsequent encounter: Secondary | ICD-10-CM | POA: Diagnosis not present

## 2018-06-03 DIAGNOSIS — G8929 Other chronic pain: Secondary | ICD-10-CM | POA: Diagnosis not present

## 2018-06-03 DIAGNOSIS — R319 Hematuria, unspecified: Secondary | ICD-10-CM | POA: Diagnosis not present

## 2018-06-04 DIAGNOSIS — Z23 Encounter for immunization: Secondary | ICD-10-CM | POA: Diagnosis not present

## 2018-06-17 DIAGNOSIS — N319 Neuromuscular dysfunction of bladder, unspecified: Secondary | ICD-10-CM | POA: Diagnosis not present

## 2018-06-17 DIAGNOSIS — L89154 Pressure ulcer of sacral region, stage 4: Secondary | ICD-10-CM | POA: Diagnosis not present

## 2018-06-17 DIAGNOSIS — G8929 Other chronic pain: Secondary | ICD-10-CM | POA: Diagnosis not present

## 2018-06-17 DIAGNOSIS — I10 Essential (primary) hypertension: Secondary | ICD-10-CM | POA: Diagnosis not present

## 2018-06-20 DIAGNOSIS — R3914 Feeling of incomplete bladder emptying: Secondary | ICD-10-CM | POA: Diagnosis not present

## 2018-06-20 DIAGNOSIS — N3942 Incontinence without sensory awareness: Secondary | ICD-10-CM | POA: Diagnosis not present

## 2018-07-01 DIAGNOSIS — G64 Other disorders of peripheral nervous system: Secondary | ICD-10-CM | POA: Diagnosis not present

## 2018-07-01 DIAGNOSIS — R2689 Other abnormalities of gait and mobility: Secondary | ICD-10-CM | POA: Diagnosis not present

## 2018-07-01 DIAGNOSIS — N319 Neuromuscular dysfunction of bladder, unspecified: Secondary | ICD-10-CM | POA: Diagnosis not present

## 2018-07-01 DIAGNOSIS — R269 Unspecified abnormalities of gait and mobility: Secondary | ICD-10-CM | POA: Diagnosis not present

## 2018-07-01 DIAGNOSIS — M6289 Other specified disorders of muscle: Secondary | ICD-10-CM | POA: Diagnosis not present

## 2018-07-01 DIAGNOSIS — L89154 Pressure ulcer of sacral region, stage 4: Secondary | ICD-10-CM | POA: Diagnosis not present

## 2018-07-02 DIAGNOSIS — N390 Urinary tract infection, site not specified: Secondary | ICD-10-CM | POA: Diagnosis not present

## 2018-07-02 DIAGNOSIS — R319 Hematuria, unspecified: Secondary | ICD-10-CM | POA: Diagnosis not present

## 2018-07-02 DIAGNOSIS — N319 Neuromuscular dysfunction of bladder, unspecified: Secondary | ICD-10-CM | POA: Diagnosis not present

## 2018-07-03 DIAGNOSIS — N319 Neuromuscular dysfunction of bladder, unspecified: Secondary | ICD-10-CM | POA: Diagnosis not present

## 2018-07-03 DIAGNOSIS — G8929 Other chronic pain: Secondary | ICD-10-CM | POA: Diagnosis not present

## 2018-07-03 DIAGNOSIS — L89154 Pressure ulcer of sacral region, stage 4: Secondary | ICD-10-CM | POA: Diagnosis not present

## 2018-07-15 DIAGNOSIS — L89154 Pressure ulcer of sacral region, stage 4: Secondary | ICD-10-CM | POA: Diagnosis not present

## 2018-07-15 DIAGNOSIS — I10 Essential (primary) hypertension: Secondary | ICD-10-CM | POA: Diagnosis not present

## 2018-07-16 DIAGNOSIS — R269 Unspecified abnormalities of gait and mobility: Secondary | ICD-10-CM | POA: Diagnosis not present

## 2018-07-16 DIAGNOSIS — G64 Other disorders of peripheral nervous system: Secondary | ICD-10-CM | POA: Diagnosis not present

## 2018-07-16 DIAGNOSIS — L89154 Pressure ulcer of sacral region, stage 4: Secondary | ICD-10-CM | POA: Diagnosis not present

## 2018-07-16 DIAGNOSIS — M6281 Muscle weakness (generalized): Secondary | ICD-10-CM | POA: Diagnosis not present

## 2018-07-16 DIAGNOSIS — N319 Neuromuscular dysfunction of bladder, unspecified: Secondary | ICD-10-CM | POA: Diagnosis not present

## 2018-07-26 DIAGNOSIS — R159 Full incontinence of feces: Secondary | ICD-10-CM | POA: Diagnosis not present

## 2018-07-26 DIAGNOSIS — G8222 Paraplegia, incomplete: Secondary | ICD-10-CM | POA: Diagnosis not present

## 2018-07-26 DIAGNOSIS — L89154 Pressure ulcer of sacral region, stage 4: Secondary | ICD-10-CM | POA: Diagnosis not present

## 2018-07-30 DIAGNOSIS — M6281 Muscle weakness (generalized): Secondary | ICD-10-CM | POA: Diagnosis not present

## 2018-07-30 DIAGNOSIS — R2689 Other abnormalities of gait and mobility: Secondary | ICD-10-CM | POA: Diagnosis not present

## 2018-07-30 DIAGNOSIS — M2350 Chronic instability of knee, unspecified knee: Secondary | ICD-10-CM | POA: Diagnosis not present

## 2018-07-31 DIAGNOSIS — L89154 Pressure ulcer of sacral region, stage 4: Secondary | ICD-10-CM | POA: Diagnosis not present

## 2018-07-31 DIAGNOSIS — I10 Essential (primary) hypertension: Secondary | ICD-10-CM | POA: Diagnosis not present

## 2018-08-05 DIAGNOSIS — L89154 Pressure ulcer of sacral region, stage 4: Secondary | ICD-10-CM | POA: Diagnosis not present

## 2018-08-05 DIAGNOSIS — N319 Neuromuscular dysfunction of bladder, unspecified: Secondary | ICD-10-CM | POA: Diagnosis not present

## 2018-08-05 DIAGNOSIS — R2681 Unsteadiness on feet: Secondary | ICD-10-CM | POA: Diagnosis not present

## 2018-08-05 DIAGNOSIS — R269 Unspecified abnormalities of gait and mobility: Secondary | ICD-10-CM | POA: Diagnosis not present

## 2018-08-07 DIAGNOSIS — M5137 Other intervertebral disc degeneration, lumbosacral region: Secondary | ICD-10-CM | POA: Diagnosis not present

## 2018-08-07 DIAGNOSIS — G609 Hereditary and idiopathic neuropathy, unspecified: Secondary | ICD-10-CM | POA: Diagnosis not present

## 2018-08-14 DIAGNOSIS — L89159 Pressure ulcer of sacral region, unspecified stage: Secondary | ICD-10-CM | POA: Diagnosis not present

## 2018-08-14 DIAGNOSIS — M8668 Other chronic osteomyelitis, other site: Secondary | ICD-10-CM | POA: Diagnosis not present

## 2018-08-14 DIAGNOSIS — L89154 Pressure ulcer of sacral region, stage 4: Secondary | ICD-10-CM | POA: Diagnosis not present

## 2018-08-15 DIAGNOSIS — L89154 Pressure ulcer of sacral region, stage 4: Secondary | ICD-10-CM | POA: Diagnosis not present

## 2018-08-15 DIAGNOSIS — F329 Major depressive disorder, single episode, unspecified: Secondary | ICD-10-CM | POA: Diagnosis not present

## 2018-08-15 DIAGNOSIS — I10 Essential (primary) hypertension: Secondary | ICD-10-CM | POA: Diagnosis not present

## 2018-08-26 DIAGNOSIS — F329 Major depressive disorder, single episode, unspecified: Secondary | ICD-10-CM | POA: Diagnosis not present

## 2018-08-26 DIAGNOSIS — I10 Essential (primary) hypertension: Secondary | ICD-10-CM | POA: Diagnosis not present

## 2018-08-26 DIAGNOSIS — L89154 Pressure ulcer of sacral region, stage 4: Secondary | ICD-10-CM | POA: Diagnosis not present

## 2018-08-26 DIAGNOSIS — N319 Neuromuscular dysfunction of bladder, unspecified: Secondary | ICD-10-CM | POA: Diagnosis not present

## 2018-09-05 DIAGNOSIS — R269 Unspecified abnormalities of gait and mobility: Secondary | ICD-10-CM | POA: Diagnosis not present

## 2018-09-05 DIAGNOSIS — R2681 Unsteadiness on feet: Secondary | ICD-10-CM | POA: Diagnosis not present

## 2018-09-09 DIAGNOSIS — L89154 Pressure ulcer of sacral region, stage 4: Secondary | ICD-10-CM | POA: Diagnosis not present

## 2018-09-09 DIAGNOSIS — M8648 Chronic osteomyelitis with draining sinus, other site: Secondary | ICD-10-CM | POA: Diagnosis not present

## 2018-09-09 DIAGNOSIS — Z7409 Other reduced mobility: Secondary | ICD-10-CM | POA: Diagnosis not present

## 2018-09-09 DIAGNOSIS — M48062 Spinal stenosis, lumbar region with neurogenic claudication: Secondary | ICD-10-CM | POA: Diagnosis not present

## 2018-09-18 DIAGNOSIS — R339 Retention of urine, unspecified: Secondary | ICD-10-CM | POA: Diagnosis not present

## 2018-09-18 DIAGNOSIS — L89154 Pressure ulcer of sacral region, stage 4: Secondary | ICD-10-CM | POA: Diagnosis not present

## 2018-09-18 DIAGNOSIS — K5900 Constipation, unspecified: Secondary | ICD-10-CM | POA: Diagnosis not present

## 2018-09-18 DIAGNOSIS — D649 Anemia, unspecified: Secondary | ICD-10-CM | POA: Diagnosis not present

## 2018-09-19 DIAGNOSIS — F329 Major depressive disorder, single episode, unspecified: Secondary | ICD-10-CM | POA: Diagnosis not present

## 2018-09-19 DIAGNOSIS — F411 Generalized anxiety disorder: Secondary | ICD-10-CM | POA: Diagnosis not present

## 2018-09-23 DIAGNOSIS — N319 Neuromuscular dysfunction of bladder, unspecified: Secondary | ICD-10-CM | POA: Diagnosis not present

## 2018-09-23 DIAGNOSIS — I10 Essential (primary) hypertension: Secondary | ICD-10-CM | POA: Diagnosis not present

## 2018-09-23 DIAGNOSIS — L89154 Pressure ulcer of sacral region, stage 4: Secondary | ICD-10-CM | POA: Diagnosis not present

## 2018-09-23 DIAGNOSIS — F411 Generalized anxiety disorder: Secondary | ICD-10-CM | POA: Diagnosis not present

## 2018-10-01 DIAGNOSIS — Z7409 Other reduced mobility: Secondary | ICD-10-CM | POA: Diagnosis not present

## 2018-10-01 DIAGNOSIS — L89154 Pressure ulcer of sacral region, stage 4: Secondary | ICD-10-CM | POA: Diagnosis not present

## 2018-10-01 DIAGNOSIS — M48062 Spinal stenosis, lumbar region with neurogenic claudication: Secondary | ICD-10-CM | POA: Diagnosis not present

## 2018-10-05 DIAGNOSIS — R110 Nausea: Secondary | ICD-10-CM | POA: Diagnosis not present

## 2018-10-06 DIAGNOSIS — D649 Anemia, unspecified: Secondary | ICD-10-CM | POA: Diagnosis not present

## 2018-10-06 DIAGNOSIS — R110 Nausea: Secondary | ICD-10-CM | POA: Diagnosis not present

## 2018-10-06 DIAGNOSIS — R319 Hematuria, unspecified: Secondary | ICD-10-CM | POA: Diagnosis not present

## 2018-10-06 DIAGNOSIS — R509 Fever, unspecified: Secondary | ICD-10-CM | POA: Diagnosis not present

## 2018-10-07 DIAGNOSIS — A419 Sepsis, unspecified organism: Secondary | ICD-10-CM | POA: Diagnosis not present

## 2018-10-07 DIAGNOSIS — L89154 Pressure ulcer of sacral region, stage 4: Secondary | ICD-10-CM | POA: Diagnosis not present

## 2018-10-07 DIAGNOSIS — K5900 Constipation, unspecified: Secondary | ICD-10-CM | POA: Diagnosis not present

## 2018-10-07 DIAGNOSIS — N319 Neuromuscular dysfunction of bladder, unspecified: Secondary | ICD-10-CM | POA: Diagnosis not present

## 2018-10-07 DIAGNOSIS — N390 Urinary tract infection, site not specified: Secondary | ICD-10-CM | POA: Diagnosis not present

## 2018-10-07 DIAGNOSIS — I10 Essential (primary) hypertension: Secondary | ICD-10-CM | POA: Diagnosis not present

## 2018-10-08 DIAGNOSIS — D649 Anemia, unspecified: Secondary | ICD-10-CM | POA: Diagnosis not present

## 2018-10-08 DIAGNOSIS — R509 Fever, unspecified: Secondary | ICD-10-CM | POA: Diagnosis not present

## 2018-10-09 DIAGNOSIS — R509 Fever, unspecified: Secondary | ICD-10-CM | POA: Diagnosis not present

## 2018-10-09 DIAGNOSIS — T83511D Infection and inflammatory reaction due to indwelling urethral catheter, subsequent encounter: Secondary | ICD-10-CM | POA: Diagnosis not present

## 2018-10-09 DIAGNOSIS — N319 Neuromuscular dysfunction of bladder, unspecified: Secondary | ICD-10-CM | POA: Diagnosis not present

## 2018-10-09 DIAGNOSIS — D649 Anemia, unspecified: Secondary | ICD-10-CM | POA: Diagnosis not present

## 2018-10-09 DIAGNOSIS — N318 Other neuromuscular dysfunction of bladder: Secondary | ICD-10-CM | POA: Diagnosis not present

## 2018-10-09 DIAGNOSIS — N390 Urinary tract infection, site not specified: Secondary | ICD-10-CM | POA: Diagnosis not present

## 2018-10-11 DIAGNOSIS — L89154 Pressure ulcer of sacral region, stage 4: Secondary | ICD-10-CM | POA: Diagnosis not present

## 2018-10-11 DIAGNOSIS — G8929 Other chronic pain: Secondary | ICD-10-CM | POA: Diagnosis not present

## 2018-10-11 DIAGNOSIS — G629 Polyneuropathy, unspecified: Secondary | ICD-10-CM | POA: Diagnosis not present

## 2018-10-11 DIAGNOSIS — Z79899 Other long term (current) drug therapy: Secondary | ICD-10-CM | POA: Diagnosis not present

## 2018-10-11 DIAGNOSIS — J209 Acute bronchitis, unspecified: Secondary | ICD-10-CM | POA: Diagnosis not present

## 2018-10-11 DIAGNOSIS — D649 Anemia, unspecified: Secondary | ICD-10-CM | POA: Diagnosis not present

## 2018-10-11 DIAGNOSIS — E876 Hypokalemia: Secondary | ICD-10-CM | POA: Diagnosis not present

## 2018-10-21 DIAGNOSIS — N319 Neuromuscular dysfunction of bladder, unspecified: Secondary | ICD-10-CM | POA: Diagnosis not present

## 2018-10-21 DIAGNOSIS — N390 Urinary tract infection, site not specified: Secondary | ICD-10-CM | POA: Diagnosis not present

## 2018-10-21 DIAGNOSIS — J209 Acute bronchitis, unspecified: Secondary | ICD-10-CM | POA: Diagnosis not present

## 2018-10-21 DIAGNOSIS — L89154 Pressure ulcer of sacral region, stage 4: Secondary | ICD-10-CM | POA: Diagnosis not present

## 2018-10-22 DIAGNOSIS — M48062 Spinal stenosis, lumbar region with neurogenic claudication: Secondary | ICD-10-CM | POA: Diagnosis not present

## 2018-10-22 DIAGNOSIS — G629 Polyneuropathy, unspecified: Secondary | ICD-10-CM | POA: Diagnosis not present

## 2018-10-22 DIAGNOSIS — F329 Major depressive disorder, single episode, unspecified: Secondary | ICD-10-CM | POA: Diagnosis not present

## 2018-10-22 DIAGNOSIS — L89154 Pressure ulcer of sacral region, stage 4: Secondary | ICD-10-CM | POA: Diagnosis not present

## 2018-10-22 DIAGNOSIS — I10 Essential (primary) hypertension: Secondary | ICD-10-CM | POA: Diagnosis not present

## 2018-10-22 DIAGNOSIS — Z7409 Other reduced mobility: Secondary | ICD-10-CM | POA: Diagnosis not present

## 2018-11-05 DIAGNOSIS — D649 Anemia, unspecified: Secondary | ICD-10-CM | POA: Diagnosis not present

## 2018-11-05 DIAGNOSIS — I10 Essential (primary) hypertension: Secondary | ICD-10-CM | POA: Diagnosis not present

## 2018-11-12 DIAGNOSIS — L89154 Pressure ulcer of sacral region, stage 4: Secondary | ICD-10-CM | POA: Diagnosis not present

## 2018-11-12 DIAGNOSIS — Z7409 Other reduced mobility: Secondary | ICD-10-CM | POA: Diagnosis not present

## 2018-11-12 DIAGNOSIS — M48062 Spinal stenosis, lumbar region with neurogenic claudication: Secondary | ICD-10-CM | POA: Diagnosis not present

## 2018-11-18 DIAGNOSIS — N319 Neuromuscular dysfunction of bladder, unspecified: Secondary | ICD-10-CM | POA: Diagnosis not present

## 2018-11-18 DIAGNOSIS — M62838 Other muscle spasm: Secondary | ICD-10-CM | POA: Diagnosis not present

## 2018-11-18 DIAGNOSIS — G8929 Other chronic pain: Secondary | ICD-10-CM | POA: Diagnosis not present

## 2018-11-18 DIAGNOSIS — L89154 Pressure ulcer of sacral region, stage 4: Secondary | ICD-10-CM | POA: Diagnosis not present

## 2018-11-19 DIAGNOSIS — F329 Major depressive disorder, single episode, unspecified: Secondary | ICD-10-CM | POA: Diagnosis not present

## 2018-11-19 DIAGNOSIS — K219 Gastro-esophageal reflux disease without esophagitis: Secondary | ICD-10-CM | POA: Diagnosis not present

## 2018-11-19 DIAGNOSIS — L89154 Pressure ulcer of sacral region, stage 4: Secondary | ICD-10-CM | POA: Diagnosis not present

## 2018-11-19 DIAGNOSIS — I10 Essential (primary) hypertension: Secondary | ICD-10-CM | POA: Diagnosis not present

## 2018-12-09 DIAGNOSIS — R269 Unspecified abnormalities of gait and mobility: Secondary | ICD-10-CM | POA: Diagnosis not present

## 2018-12-09 DIAGNOSIS — R2681 Unsteadiness on feet: Secondary | ICD-10-CM | POA: Diagnosis not present

## 2018-12-09 DIAGNOSIS — L89154 Pressure ulcer of sacral region, stage 4: Secondary | ICD-10-CM | POA: Diagnosis not present

## 2018-12-09 DIAGNOSIS — N319 Neuromuscular dysfunction of bladder, unspecified: Secondary | ICD-10-CM | POA: Diagnosis not present

## 2018-12-16 DIAGNOSIS — N319 Neuromuscular dysfunction of bladder, unspecified: Secondary | ICD-10-CM | POA: Diagnosis not present

## 2018-12-16 DIAGNOSIS — G8929 Other chronic pain: Secondary | ICD-10-CM | POA: Diagnosis not present

## 2018-12-16 DIAGNOSIS — G629 Polyneuropathy, unspecified: Secondary | ICD-10-CM | POA: Diagnosis not present

## 2018-12-16 DIAGNOSIS — L89154 Pressure ulcer of sacral region, stage 4: Secondary | ICD-10-CM | POA: Diagnosis not present

## 2018-12-23 DIAGNOSIS — N319 Neuromuscular dysfunction of bladder, unspecified: Secondary | ICD-10-CM | POA: Diagnosis not present

## 2018-12-23 DIAGNOSIS — L89154 Pressure ulcer of sacral region, stage 4: Secondary | ICD-10-CM | POA: Diagnosis not present

## 2018-12-23 DIAGNOSIS — I10 Essential (primary) hypertension: Secondary | ICD-10-CM | POA: Diagnosis not present

## 2018-12-23 DIAGNOSIS — G8929 Other chronic pain: Secondary | ICD-10-CM | POA: Diagnosis not present

## 2019-01-13 DIAGNOSIS — L89154 Pressure ulcer of sacral region, stage 4: Secondary | ICD-10-CM | POA: Diagnosis not present

## 2019-01-13 DIAGNOSIS — N319 Neuromuscular dysfunction of bladder, unspecified: Secondary | ICD-10-CM | POA: Diagnosis not present

## 2019-01-13 DIAGNOSIS — M62838 Other muscle spasm: Secondary | ICD-10-CM | POA: Diagnosis not present

## 2019-01-13 DIAGNOSIS — G8929 Other chronic pain: Secondary | ICD-10-CM | POA: Diagnosis not present

## 2019-01-20 DIAGNOSIS — I10 Essential (primary) hypertension: Secondary | ICD-10-CM | POA: Diagnosis not present

## 2019-01-20 DIAGNOSIS — L89154 Pressure ulcer of sacral region, stage 4: Secondary | ICD-10-CM | POA: Diagnosis not present

## 2019-01-20 DIAGNOSIS — N390 Urinary tract infection, site not specified: Secondary | ICD-10-CM | POA: Diagnosis not present

## 2019-01-20 DIAGNOSIS — G629 Polyneuropathy, unspecified: Secondary | ICD-10-CM | POA: Diagnosis not present

## 2019-01-21 DIAGNOSIS — N390 Urinary tract infection, site not specified: Secondary | ICD-10-CM | POA: Diagnosis not present

## 2019-01-21 DIAGNOSIS — N319 Neuromuscular dysfunction of bladder, unspecified: Secondary | ICD-10-CM | POA: Diagnosis not present

## 2019-01-21 DIAGNOSIS — T83511D Infection and inflammatory reaction due to indwelling urethral catheter, subsequent encounter: Secondary | ICD-10-CM | POA: Diagnosis not present

## 2019-01-22 DIAGNOSIS — L89154 Pressure ulcer of sacral region, stage 4: Secondary | ICD-10-CM | POA: Diagnosis not present

## 2019-01-22 DIAGNOSIS — Z789 Other specified health status: Secondary | ICD-10-CM | POA: Diagnosis not present

## 2019-01-22 DIAGNOSIS — Z7409 Other reduced mobility: Secondary | ICD-10-CM | POA: Diagnosis not present

## 2019-01-22 DIAGNOSIS — M48062 Spinal stenosis, lumbar region with neurogenic claudication: Secondary | ICD-10-CM | POA: Diagnosis not present

## 2019-01-29 DIAGNOSIS — M2042 Other hammer toe(s) (acquired), left foot: Secondary | ICD-10-CM | POA: Diagnosis not present

## 2019-01-29 DIAGNOSIS — B351 Tinea unguium: Secondary | ICD-10-CM | POA: Diagnosis not present

## 2019-02-10 DIAGNOSIS — N319 Neuromuscular dysfunction of bladder, unspecified: Secondary | ICD-10-CM | POA: Diagnosis not present

## 2019-02-10 DIAGNOSIS — D649 Anemia, unspecified: Secondary | ICD-10-CM | POA: Diagnosis not present

## 2019-02-10 DIAGNOSIS — K219 Gastro-esophageal reflux disease without esophagitis: Secondary | ICD-10-CM | POA: Diagnosis not present

## 2019-02-10 DIAGNOSIS — L89154 Pressure ulcer of sacral region, stage 4: Secondary | ICD-10-CM | POA: Diagnosis not present

## 2019-02-12 DIAGNOSIS — Z789 Other specified health status: Secondary | ICD-10-CM | POA: Diagnosis not present

## 2019-02-12 DIAGNOSIS — Z7409 Other reduced mobility: Secondary | ICD-10-CM | POA: Diagnosis not present

## 2019-02-12 DIAGNOSIS — L89154 Pressure ulcer of sacral region, stage 4: Secondary | ICD-10-CM | POA: Diagnosis not present

## 2019-02-13 DIAGNOSIS — M48062 Spinal stenosis, lumbar region with neurogenic claudication: Secondary | ICD-10-CM | POA: Diagnosis not present

## 2019-02-13 DIAGNOSIS — G609 Hereditary and idiopathic neuropathy, unspecified: Secondary | ICD-10-CM | POA: Diagnosis not present

## 2019-02-13 DIAGNOSIS — M5137 Other intervertebral disc degeneration, lumbosacral region: Secondary | ICD-10-CM | POA: Diagnosis not present

## 2019-02-17 DIAGNOSIS — I10 Essential (primary) hypertension: Secondary | ICD-10-CM | POA: Diagnosis not present

## 2019-02-17 DIAGNOSIS — L89154 Pressure ulcer of sacral region, stage 4: Secondary | ICD-10-CM | POA: Diagnosis not present

## 2019-02-17 DIAGNOSIS — G629 Polyneuropathy, unspecified: Secondary | ICD-10-CM | POA: Diagnosis not present

## 2019-02-17 DIAGNOSIS — G8929 Other chronic pain: Secondary | ICD-10-CM | POA: Diagnosis not present

## 2019-03-10 DIAGNOSIS — G8929 Other chronic pain: Secondary | ICD-10-CM | POA: Diagnosis not present

## 2019-03-10 DIAGNOSIS — N319 Neuromuscular dysfunction of bladder, unspecified: Secondary | ICD-10-CM | POA: Diagnosis not present

## 2019-03-10 DIAGNOSIS — K219 Gastro-esophageal reflux disease without esophagitis: Secondary | ICD-10-CM | POA: Diagnosis not present

## 2019-03-10 DIAGNOSIS — L89154 Pressure ulcer of sacral region, stage 4: Secondary | ICD-10-CM | POA: Diagnosis not present

## 2019-03-11 DIAGNOSIS — M48062 Spinal stenosis, lumbar region with neurogenic claudication: Secondary | ICD-10-CM | POA: Diagnosis not present

## 2019-03-11 DIAGNOSIS — L89154 Pressure ulcer of sacral region, stage 4: Secondary | ICD-10-CM | POA: Diagnosis not present

## 2019-03-11 DIAGNOSIS — Z789 Other specified health status: Secondary | ICD-10-CM | POA: Diagnosis not present

## 2019-03-11 DIAGNOSIS — Z7409 Other reduced mobility: Secondary | ICD-10-CM | POA: Diagnosis not present

## 2019-03-17 DIAGNOSIS — G8929 Other chronic pain: Secondary | ICD-10-CM | POA: Diagnosis not present

## 2019-03-17 DIAGNOSIS — L89154 Pressure ulcer of sacral region, stage 4: Secondary | ICD-10-CM | POA: Diagnosis not present

## 2019-03-17 DIAGNOSIS — R5381 Other malaise: Secondary | ICD-10-CM | POA: Diagnosis not present

## 2019-03-17 DIAGNOSIS — I10 Essential (primary) hypertension: Secondary | ICD-10-CM | POA: Diagnosis not present

## 2019-03-18 DIAGNOSIS — H11152 Pinguecula, left eye: Secondary | ICD-10-CM | POA: Diagnosis not present

## 2019-03-18 DIAGNOSIS — H2513 Age-related nuclear cataract, bilateral: Secondary | ICD-10-CM | POA: Diagnosis not present

## 2019-03-18 DIAGNOSIS — H52203 Unspecified astigmatism, bilateral: Secondary | ICD-10-CM | POA: Diagnosis not present

## 2019-03-18 DIAGNOSIS — H524 Presbyopia: Secondary | ICD-10-CM | POA: Diagnosis not present

## 2019-03-18 DIAGNOSIS — H02834 Dermatochalasis of left upper eyelid: Secondary | ICD-10-CM | POA: Diagnosis not present

## 2019-03-18 DIAGNOSIS — H5203 Hypermetropia, bilateral: Secondary | ICD-10-CM | POA: Diagnosis not present

## 2019-03-18 DIAGNOSIS — H02831 Dermatochalasis of right upper eyelid: Secondary | ICD-10-CM | POA: Diagnosis not present

## 2019-03-18 DIAGNOSIS — H25043 Posterior subcapsular polar age-related cataract, bilateral: Secondary | ICD-10-CM | POA: Diagnosis not present

## 2019-03-25 DIAGNOSIS — R269 Unspecified abnormalities of gait and mobility: Secondary | ICD-10-CM | POA: Diagnosis not present

## 2019-03-25 DIAGNOSIS — M6281 Muscle weakness (generalized): Secondary | ICD-10-CM | POA: Diagnosis not present

## 2019-04-08 DIAGNOSIS — H1010 Acute atopic conjunctivitis, unspecified eye: Secondary | ICD-10-CM | POA: Diagnosis not present

## 2019-04-08 DIAGNOSIS — R296 Repeated falls: Secondary | ICD-10-CM | POA: Diagnosis not present

## 2019-04-08 DIAGNOSIS — M62838 Other muscle spasm: Secondary | ICD-10-CM | POA: Diagnosis not present

## 2019-04-08 DIAGNOSIS — G8929 Other chronic pain: Secondary | ICD-10-CM | POA: Diagnosis not present

## 2019-04-09 DIAGNOSIS — M48062 Spinal stenosis, lumbar region with neurogenic claudication: Secondary | ICD-10-CM | POA: Diagnosis not present

## 2019-04-09 DIAGNOSIS — Z7409 Other reduced mobility: Secondary | ICD-10-CM | POA: Diagnosis not present

## 2019-04-09 DIAGNOSIS — L89154 Pressure ulcer of sacral region, stage 4: Secondary | ICD-10-CM | POA: Diagnosis not present

## 2019-04-14 DIAGNOSIS — L89154 Pressure ulcer of sacral region, stage 4: Secondary | ICD-10-CM | POA: Diagnosis not present

## 2019-04-14 DIAGNOSIS — G8929 Other chronic pain: Secondary | ICD-10-CM | POA: Diagnosis not present

## 2019-04-14 DIAGNOSIS — G629 Polyneuropathy, unspecified: Secondary | ICD-10-CM | POA: Diagnosis not present

## 2019-04-25 DIAGNOSIS — B351 Tinea unguium: Secondary | ICD-10-CM | POA: Diagnosis not present

## 2019-04-30 DIAGNOSIS — L89154 Pressure ulcer of sacral region, stage 4: Secondary | ICD-10-CM | POA: Diagnosis not present

## 2019-04-30 DIAGNOSIS — D649 Anemia, unspecified: Secondary | ICD-10-CM | POA: Diagnosis not present

## 2019-05-05 DIAGNOSIS — E871 Hypo-osmolality and hyponatremia: Secondary | ICD-10-CM | POA: Diagnosis not present

## 2019-05-05 DIAGNOSIS — H1010 Acute atopic conjunctivitis, unspecified eye: Secondary | ICD-10-CM | POA: Diagnosis not present

## 2019-05-05 DIAGNOSIS — B372 Candidiasis of skin and nail: Secondary | ICD-10-CM | POA: Diagnosis not present

## 2019-05-05 DIAGNOSIS — R296 Repeated falls: Secondary | ICD-10-CM | POA: Diagnosis not present

## 2019-05-07 DIAGNOSIS — M48062 Spinal stenosis, lumbar region with neurogenic claudication: Secondary | ICD-10-CM | POA: Diagnosis not present

## 2019-05-07 DIAGNOSIS — L89154 Pressure ulcer of sacral region, stage 4: Secondary | ICD-10-CM | POA: Diagnosis not present

## 2019-05-07 DIAGNOSIS — Z7409 Other reduced mobility: Secondary | ICD-10-CM | POA: Diagnosis not present

## 2019-05-09 DIAGNOSIS — D649 Anemia, unspecified: Secondary | ICD-10-CM | POA: Diagnosis not present

## 2019-05-09 DIAGNOSIS — Z79899 Other long term (current) drug therapy: Secondary | ICD-10-CM | POA: Diagnosis not present

## 2019-05-14 DIAGNOSIS — N319 Neuromuscular dysfunction of bladder, unspecified: Secondary | ICD-10-CM | POA: Diagnosis not present

## 2019-05-14 DIAGNOSIS — I10 Essential (primary) hypertension: Secondary | ICD-10-CM | POA: Diagnosis not present

## 2019-05-14 DIAGNOSIS — L209 Atopic dermatitis, unspecified: Secondary | ICD-10-CM | POA: Diagnosis not present

## 2019-05-14 DIAGNOSIS — N3020 Other chronic cystitis without hematuria: Secondary | ICD-10-CM | POA: Diagnosis not present

## 2019-05-15 DIAGNOSIS — R319 Hematuria, unspecified: Secondary | ICD-10-CM | POA: Diagnosis not present

## 2019-05-15 DIAGNOSIS — N390 Urinary tract infection, site not specified: Secondary | ICD-10-CM | POA: Diagnosis not present

## 2019-05-17 DIAGNOSIS — N390 Urinary tract infection, site not specified: Secondary | ICD-10-CM | POA: Diagnosis not present

## 2019-05-17 DIAGNOSIS — D649 Anemia, unspecified: Secondary | ICD-10-CM | POA: Diagnosis not present

## 2019-05-20 DIAGNOSIS — I10 Essential (primary) hypertension: Secondary | ICD-10-CM | POA: Diagnosis not present

## 2019-05-20 DIAGNOSIS — R339 Retention of urine, unspecified: Secondary | ICD-10-CM | POA: Diagnosis not present

## 2019-05-20 DIAGNOSIS — R21 Rash and other nonspecific skin eruption: Secondary | ICD-10-CM | POA: Diagnosis not present

## 2019-05-20 DIAGNOSIS — L89154 Pressure ulcer of sacral region, stage 4: Secondary | ICD-10-CM | POA: Diagnosis not present

## 2019-05-21 DIAGNOSIS — L299 Pruritus, unspecified: Secondary | ICD-10-CM | POA: Diagnosis not present

## 2019-05-21 DIAGNOSIS — L209 Atopic dermatitis, unspecified: Secondary | ICD-10-CM | POA: Diagnosis not present

## 2019-05-27 DIAGNOSIS — L89154 Pressure ulcer of sacral region, stage 4: Secondary | ICD-10-CM | POA: Diagnosis not present

## 2019-05-27 DIAGNOSIS — G6181 Chronic inflammatory demyelinating polyneuritis: Secondary | ICD-10-CM | POA: Diagnosis not present

## 2019-05-27 DIAGNOSIS — M5116 Intervertebral disc disorders with radiculopathy, lumbar region: Secondary | ICD-10-CM | POA: Diagnosis not present

## 2019-05-27 DIAGNOSIS — N319 Neuromuscular dysfunction of bladder, unspecified: Secondary | ICD-10-CM | POA: Diagnosis not present

## 2019-05-27 DIAGNOSIS — Z8249 Family history of ischemic heart disease and other diseases of the circulatory system: Secondary | ICD-10-CM | POA: Diagnosis not present

## 2019-05-27 DIAGNOSIS — H9190 Unspecified hearing loss, unspecified ear: Secondary | ICD-10-CM | POA: Diagnosis not present

## 2019-05-27 DIAGNOSIS — Z7409 Other reduced mobility: Secondary | ICD-10-CM | POA: Diagnosis not present

## 2019-05-27 DIAGNOSIS — T83031A Leakage of indwelling urethral catheter, initial encounter: Secondary | ICD-10-CM | POA: Diagnosis not present

## 2019-05-27 DIAGNOSIS — R112 Nausea with vomiting, unspecified: Secondary | ICD-10-CM | POA: Diagnosis not present

## 2019-05-27 DIAGNOSIS — L539 Erythematous condition, unspecified: Secondary | ICD-10-CM | POA: Diagnosis not present

## 2019-05-27 DIAGNOSIS — N368 Other specified disorders of urethra: Secondary | ICD-10-CM | POA: Diagnosis not present

## 2019-05-27 DIAGNOSIS — Z90710 Acquired absence of both cervix and uterus: Secondary | ICD-10-CM | POA: Diagnosis not present

## 2019-05-27 DIAGNOSIS — Z85828 Personal history of other malignant neoplasm of skin: Secondary | ICD-10-CM | POA: Diagnosis not present

## 2019-05-27 DIAGNOSIS — L910 Hypertrophic scar: Secondary | ICD-10-CM | POA: Diagnosis not present

## 2019-05-27 DIAGNOSIS — Z993 Dependence on wheelchair: Secondary | ICD-10-CM | POA: Diagnosis not present

## 2019-05-27 DIAGNOSIS — K219 Gastro-esophageal reflux disease without esophagitis: Secondary | ICD-10-CM | POA: Diagnosis not present

## 2019-05-27 DIAGNOSIS — R682 Dry mouth, unspecified: Secondary | ICD-10-CM | POA: Diagnosis not present

## 2019-05-27 DIAGNOSIS — I10 Essential (primary) hypertension: Secondary | ICD-10-CM | POA: Diagnosis not present

## 2019-05-27 DIAGNOSIS — R9431 Abnormal electrocardiogram [ECG] [EKG]: Secondary | ICD-10-CM | POA: Diagnosis not present

## 2019-05-27 DIAGNOSIS — I252 Old myocardial infarction: Secondary | ICD-10-CM | POA: Diagnosis not present

## 2019-05-27 DIAGNOSIS — K5900 Constipation, unspecified: Secondary | ICD-10-CM | POA: Diagnosis not present

## 2019-05-27 DIAGNOSIS — D649 Anemia, unspecified: Secondary | ICD-10-CM | POA: Diagnosis not present

## 2019-06-02 DIAGNOSIS — L89154 Pressure ulcer of sacral region, stage 4: Secondary | ICD-10-CM | POA: Diagnosis not present

## 2019-06-02 DIAGNOSIS — L209 Atopic dermatitis, unspecified: Secondary | ICD-10-CM | POA: Diagnosis not present

## 2019-06-02 DIAGNOSIS — N319 Neuromuscular dysfunction of bladder, unspecified: Secondary | ICD-10-CM | POA: Diagnosis not present

## 2019-06-02 DIAGNOSIS — H1010 Acute atopic conjunctivitis, unspecified eye: Secondary | ICD-10-CM | POA: Diagnosis not present

## 2019-06-03 DIAGNOSIS — Z7409 Other reduced mobility: Secondary | ICD-10-CM | POA: Diagnosis not present

## 2019-06-03 DIAGNOSIS — K5900 Constipation, unspecified: Secondary | ICD-10-CM | POA: Diagnosis not present

## 2019-06-03 DIAGNOSIS — L910 Hypertrophic scar: Secondary | ICD-10-CM | POA: Diagnosis not present

## 2019-06-03 DIAGNOSIS — K219 Gastro-esophageal reflux disease without esophagitis: Secondary | ICD-10-CM | POA: Diagnosis not present

## 2019-06-03 DIAGNOSIS — Z90710 Acquired absence of both cervix and uterus: Secondary | ICD-10-CM | POA: Diagnosis not present

## 2019-06-03 DIAGNOSIS — Z85828 Personal history of other malignant neoplasm of skin: Secondary | ICD-10-CM | POA: Diagnosis not present

## 2019-06-03 DIAGNOSIS — H9190 Unspecified hearing loss, unspecified ear: Secondary | ICD-10-CM | POA: Diagnosis not present

## 2019-06-03 DIAGNOSIS — L97512 Non-pressure chronic ulcer of other part of right foot with fat layer exposed: Secondary | ICD-10-CM | POA: Diagnosis not present

## 2019-06-03 DIAGNOSIS — M5116 Intervertebral disc disorders with radiculopathy, lumbar region: Secondary | ICD-10-CM | POA: Diagnosis not present

## 2019-06-03 DIAGNOSIS — T83031A Leakage of indwelling urethral catheter, initial encounter: Secondary | ICD-10-CM | POA: Diagnosis not present

## 2019-06-03 DIAGNOSIS — D649 Anemia, unspecified: Secondary | ICD-10-CM | POA: Diagnosis not present

## 2019-06-03 DIAGNOSIS — L539 Erythematous condition, unspecified: Secondary | ICD-10-CM | POA: Diagnosis not present

## 2019-06-03 DIAGNOSIS — I10 Essential (primary) hypertension: Secondary | ICD-10-CM | POA: Diagnosis not present

## 2019-06-03 DIAGNOSIS — G6181 Chronic inflammatory demyelinating polyneuritis: Secondary | ICD-10-CM | POA: Diagnosis not present

## 2019-06-03 DIAGNOSIS — R682 Dry mouth, unspecified: Secondary | ICD-10-CM | POA: Diagnosis not present

## 2019-06-03 DIAGNOSIS — R112 Nausea with vomiting, unspecified: Secondary | ICD-10-CM | POA: Diagnosis not present

## 2019-06-03 DIAGNOSIS — N319 Neuromuscular dysfunction of bladder, unspecified: Secondary | ICD-10-CM | POA: Diagnosis not present

## 2019-06-03 DIAGNOSIS — Z8249 Family history of ischemic heart disease and other diseases of the circulatory system: Secondary | ICD-10-CM | POA: Diagnosis not present

## 2019-06-03 DIAGNOSIS — N368 Other specified disorders of urethra: Secondary | ICD-10-CM | POA: Diagnosis not present

## 2019-06-03 DIAGNOSIS — L89154 Pressure ulcer of sacral region, stage 4: Secondary | ICD-10-CM | POA: Diagnosis not present

## 2019-06-03 DIAGNOSIS — Z993 Dependence on wheelchair: Secondary | ICD-10-CM | POA: Diagnosis not present

## 2019-06-23 DIAGNOSIS — G9341 Metabolic encephalopathy: Secondary | ICD-10-CM | POA: Diagnosis not present

## 2019-06-23 DIAGNOSIS — A4159 Other Gram-negative sepsis: Secondary | ICD-10-CM | POA: Diagnosis not present

## 2019-06-23 DIAGNOSIS — T361X5A Adverse effect of cephalosporins and other beta-lactam antibiotics, initial encounter: Secondary | ICD-10-CM | POA: Diagnosis not present

## 2019-06-23 DIAGNOSIS — L89154 Pressure ulcer of sacral region, stage 4: Secondary | ICD-10-CM | POA: Diagnosis not present

## 2019-06-23 DIAGNOSIS — L89159 Pressure ulcer of sacral region, unspecified stage: Secondary | ICD-10-CM | POA: Diagnosis not present

## 2019-06-23 DIAGNOSIS — N132 Hydronephrosis with renal and ureteral calculous obstruction: Secondary | ICD-10-CM | POA: Diagnosis not present

## 2019-06-23 DIAGNOSIS — K521 Toxic gastroenteritis and colitis: Secondary | ICD-10-CM | POA: Diagnosis not present

## 2019-06-23 DIAGNOSIS — N319 Neuromuscular dysfunction of bladder, unspecified: Secondary | ICD-10-CM | POA: Diagnosis not present

## 2019-06-23 DIAGNOSIS — Z48817 Encounter for surgical aftercare following surgery on the skin and subcutaneous tissue: Secondary | ICD-10-CM | POA: Diagnosis not present

## 2019-06-23 DIAGNOSIS — Z20828 Contact with and (suspected) exposure to other viral communicable diseases: Secondary | ICD-10-CM | POA: Diagnosis not present

## 2019-06-23 DIAGNOSIS — F05 Delirium due to known physiological condition: Secondary | ICD-10-CM | POA: Diagnosis not present

## 2019-06-23 DIAGNOSIS — Z9889 Other specified postprocedural states: Secondary | ICD-10-CM | POA: Diagnosis not present

## 2019-06-23 DIAGNOSIS — R0902 Hypoxemia: Secondary | ICD-10-CM | POA: Diagnosis not present

## 2019-06-23 DIAGNOSIS — N318 Other neuromuscular dysfunction of bladder: Secondary | ICD-10-CM | POA: Diagnosis not present

## 2019-06-23 DIAGNOSIS — R7881 Bacteremia: Secondary | ICD-10-CM | POA: Diagnosis not present

## 2019-06-23 DIAGNOSIS — E876 Hypokalemia: Secondary | ICD-10-CM | POA: Diagnosis not present

## 2019-06-23 DIAGNOSIS — N179 Acute kidney failure, unspecified: Secondary | ICD-10-CM | POA: Diagnosis not present

## 2019-06-23 DIAGNOSIS — E871 Hypo-osmolality and hyponatremia: Secondary | ICD-10-CM | POA: Diagnosis not present

## 2019-06-23 DIAGNOSIS — F329 Major depressive disorder, single episode, unspecified: Secondary | ICD-10-CM | POA: Diagnosis not present

## 2019-06-23 DIAGNOSIS — M21372 Foot drop, left foot: Secondary | ICD-10-CM | POA: Diagnosis not present

## 2019-06-23 DIAGNOSIS — D649 Anemia, unspecified: Secondary | ICD-10-CM | POA: Diagnosis not present

## 2019-06-23 DIAGNOSIS — G629 Polyneuropathy, unspecified: Secondary | ICD-10-CM | POA: Diagnosis not present

## 2019-06-23 DIAGNOSIS — Z7409 Other reduced mobility: Secondary | ICD-10-CM | POA: Diagnosis not present

## 2019-06-23 DIAGNOSIS — M48062 Spinal stenosis, lumbar region with neurogenic claudication: Secondary | ICD-10-CM | POA: Diagnosis not present

## 2019-06-23 DIAGNOSIS — E669 Obesity, unspecified: Secondary | ICD-10-CM | POA: Diagnosis not present

## 2019-06-23 DIAGNOSIS — K219 Gastro-esophageal reflux disease without esophagitis: Secondary | ICD-10-CM | POA: Diagnosis not present

## 2019-06-23 DIAGNOSIS — B964 Proteus (mirabilis) (morganii) as the cause of diseases classified elsewhere: Secondary | ICD-10-CM | POA: Diagnosis not present

## 2019-06-23 DIAGNOSIS — M5137 Other intervertebral disc degeneration, lumbosacral region: Secondary | ICD-10-CM | POA: Diagnosis not present

## 2019-06-23 DIAGNOSIS — M5136 Other intervertebral disc degeneration, lumbar region: Secondary | ICD-10-CM | POA: Diagnosis not present

## 2019-06-23 DIAGNOSIS — N39490 Overflow incontinence: Secondary | ICD-10-CM | POA: Diagnosis not present

## 2019-06-23 DIAGNOSIS — I272 Pulmonary hypertension, unspecified: Secondary | ICD-10-CM | POA: Diagnosis not present

## 2019-06-23 DIAGNOSIS — A419 Sepsis, unspecified organism: Secondary | ICD-10-CM | POA: Diagnosis not present

## 2019-06-23 DIAGNOSIS — M48061 Spinal stenosis, lumbar region without neurogenic claudication: Secondary | ICD-10-CM | POA: Diagnosis not present

## 2019-06-23 DIAGNOSIS — M5106 Intervertebral disc disorders with myelopathy, lumbar region: Secondary | ICD-10-CM | POA: Diagnosis not present

## 2019-06-23 DIAGNOSIS — G92 Toxic encephalopathy: Secondary | ICD-10-CM | POA: Diagnosis not present

## 2019-06-23 DIAGNOSIS — R6520 Severe sepsis without septic shock: Secondary | ICD-10-CM | POA: Diagnosis not present

## 2019-06-23 DIAGNOSIS — K5900 Constipation, unspecified: Secondary | ICD-10-CM | POA: Diagnosis not present

## 2019-06-23 DIAGNOSIS — Z960 Presence of urogenital implants: Secondary | ICD-10-CM | POA: Diagnosis not present

## 2019-06-23 DIAGNOSIS — Z993 Dependence on wheelchair: Secondary | ICD-10-CM | POA: Diagnosis not present

## 2019-06-23 DIAGNOSIS — R269 Unspecified abnormalities of gait and mobility: Secondary | ICD-10-CM | POA: Diagnosis not present

## 2019-06-23 DIAGNOSIS — I083 Combined rheumatic disorders of mitral, aortic and tricuspid valves: Secondary | ICD-10-CM | POA: Diagnosis not present

## 2019-06-23 DIAGNOSIS — G8222 Paraplegia, incomplete: Secondary | ICD-10-CM | POA: Diagnosis not present

## 2019-06-23 DIAGNOSIS — I10 Essential (primary) hypertension: Secondary | ICD-10-CM | POA: Diagnosis not present

## 2019-06-23 DIAGNOSIS — K592 Neurogenic bowel, not elsewhere classified: Secondary | ICD-10-CM | POA: Diagnosis not present

## 2019-06-23 DIAGNOSIS — E785 Hyperlipidemia, unspecified: Secondary | ICD-10-CM | POA: Diagnosis not present

## 2019-06-23 DIAGNOSIS — G6181 Chronic inflammatory demyelinating polyneuritis: Secondary | ICD-10-CM | POA: Diagnosis not present

## 2019-06-23 DIAGNOSIS — M21371 Foot drop, right foot: Secondary | ICD-10-CM | POA: Diagnosis not present

## 2019-06-23 DIAGNOSIS — Z91048 Other nonmedicinal substance allergy status: Secondary | ICD-10-CM | POA: Diagnosis not present

## 2019-06-23 DIAGNOSIS — J302 Other seasonal allergic rhinitis: Secondary | ICD-10-CM | POA: Diagnosis not present

## 2019-06-23 DIAGNOSIS — H2513 Age-related nuclear cataract, bilateral: Secondary | ICD-10-CM | POA: Diagnosis not present

## 2019-06-23 DIAGNOSIS — N136 Pyonephrosis: Secondary | ICD-10-CM | POA: Diagnosis not present

## 2019-06-23 DIAGNOSIS — F419 Anxiety disorder, unspecified: Secondary | ICD-10-CM | POA: Diagnosis not present

## 2019-06-23 DIAGNOSIS — G9340 Encephalopathy, unspecified: Secondary | ICD-10-CM | POA: Diagnosis not present

## 2019-06-23 DIAGNOSIS — R140 Abdominal distension (gaseous): Secondary | ICD-10-CM | POA: Diagnosis not present

## 2019-06-24 DIAGNOSIS — I10 Essential (primary) hypertension: Secondary | ICD-10-CM | POA: Diagnosis not present

## 2019-06-24 DIAGNOSIS — E871 Hypo-osmolality and hyponatremia: Secondary | ICD-10-CM | POA: Diagnosis not present

## 2019-06-24 DIAGNOSIS — Z7409 Other reduced mobility: Secondary | ICD-10-CM | POA: Diagnosis not present

## 2019-06-24 DIAGNOSIS — L89159 Pressure ulcer of sacral region, unspecified stage: Secondary | ICD-10-CM | POA: Diagnosis not present

## 2019-06-25 DIAGNOSIS — I10 Essential (primary) hypertension: Secondary | ICD-10-CM | POA: Diagnosis not present

## 2019-06-25 DIAGNOSIS — L89159 Pressure ulcer of sacral region, unspecified stage: Secondary | ICD-10-CM | POA: Diagnosis not present

## 2019-06-25 DIAGNOSIS — Z7409 Other reduced mobility: Secondary | ICD-10-CM | POA: Diagnosis not present

## 2019-06-25 DIAGNOSIS — E871 Hypo-osmolality and hyponatremia: Secondary | ICD-10-CM | POA: Diagnosis not present

## 2019-06-26 DIAGNOSIS — Z7409 Other reduced mobility: Secondary | ICD-10-CM | POA: Diagnosis not present

## 2019-06-26 DIAGNOSIS — I10 Essential (primary) hypertension: Secondary | ICD-10-CM | POA: Diagnosis not present

## 2019-06-26 DIAGNOSIS — E871 Hypo-osmolality and hyponatremia: Secondary | ICD-10-CM | POA: Diagnosis not present

## 2019-06-26 DIAGNOSIS — L89159 Pressure ulcer of sacral region, unspecified stage: Secondary | ICD-10-CM | POA: Diagnosis not present

## 2019-06-27 DIAGNOSIS — L89159 Pressure ulcer of sacral region, unspecified stage: Secondary | ICD-10-CM | POA: Diagnosis not present

## 2019-06-27 DIAGNOSIS — E871 Hypo-osmolality and hyponatremia: Secondary | ICD-10-CM | POA: Diagnosis not present

## 2019-06-27 DIAGNOSIS — I10 Essential (primary) hypertension: Secondary | ICD-10-CM | POA: Diagnosis not present

## 2019-06-27 DIAGNOSIS — Z7409 Other reduced mobility: Secondary | ICD-10-CM | POA: Diagnosis not present

## 2019-06-28 DIAGNOSIS — N179 Acute kidney failure, unspecified: Secondary | ICD-10-CM | POA: Diagnosis not present

## 2019-06-28 DIAGNOSIS — N319 Neuromuscular dysfunction of bladder, unspecified: Secondary | ICD-10-CM | POA: Diagnosis not present

## 2019-06-28 DIAGNOSIS — R140 Abdominal distension (gaseous): Secondary | ICD-10-CM | POA: Diagnosis not present

## 2019-06-28 DIAGNOSIS — R0902 Hypoxemia: Secondary | ICD-10-CM | POA: Diagnosis not present

## 2019-06-28 DIAGNOSIS — G9340 Encephalopathy, unspecified: Secondary | ICD-10-CM | POA: Diagnosis not present

## 2019-06-28 DIAGNOSIS — A419 Sepsis, unspecified organism: Secondary | ICD-10-CM | POA: Diagnosis not present

## 2019-06-29 DIAGNOSIS — G9340 Encephalopathy, unspecified: Secondary | ICD-10-CM | POA: Diagnosis not present

## 2019-06-29 DIAGNOSIS — N179 Acute kidney failure, unspecified: Secondary | ICD-10-CM | POA: Diagnosis not present

## 2019-06-29 DIAGNOSIS — A419 Sepsis, unspecified organism: Secondary | ICD-10-CM | POA: Diagnosis not present

## 2019-06-29 DIAGNOSIS — N319 Neuromuscular dysfunction of bladder, unspecified: Secondary | ICD-10-CM | POA: Diagnosis not present

## 2019-06-30 DIAGNOSIS — L89159 Pressure ulcer of sacral region, unspecified stage: Secondary | ICD-10-CM | POA: Diagnosis not present

## 2019-06-30 DIAGNOSIS — G9340 Encephalopathy, unspecified: Secondary | ICD-10-CM | POA: Diagnosis not present

## 2019-06-30 DIAGNOSIS — I10 Essential (primary) hypertension: Secondary | ICD-10-CM | POA: Diagnosis not present

## 2019-06-30 DIAGNOSIS — N319 Neuromuscular dysfunction of bladder, unspecified: Secondary | ICD-10-CM | POA: Diagnosis not present

## 2019-06-30 DIAGNOSIS — A419 Sepsis, unspecified organism: Secondary | ICD-10-CM | POA: Diagnosis not present

## 2019-06-30 DIAGNOSIS — N179 Acute kidney failure, unspecified: Secondary | ICD-10-CM | POA: Diagnosis not present

## 2019-06-30 DIAGNOSIS — E871 Hypo-osmolality and hyponatremia: Secondary | ICD-10-CM | POA: Diagnosis not present

## 2019-07-01 DIAGNOSIS — L89159 Pressure ulcer of sacral region, unspecified stage: Secondary | ICD-10-CM | POA: Diagnosis not present

## 2019-07-01 DIAGNOSIS — E871 Hypo-osmolality and hyponatremia: Secondary | ICD-10-CM | POA: Diagnosis not present

## 2019-07-01 DIAGNOSIS — N179 Acute kidney failure, unspecified: Secondary | ICD-10-CM | POA: Diagnosis not present

## 2019-07-01 DIAGNOSIS — R0902 Hypoxemia: Secondary | ICD-10-CM | POA: Diagnosis not present

## 2019-07-01 DIAGNOSIS — G9340 Encephalopathy, unspecified: Secondary | ICD-10-CM | POA: Diagnosis not present

## 2019-07-01 DIAGNOSIS — N319 Neuromuscular dysfunction of bladder, unspecified: Secondary | ICD-10-CM | POA: Diagnosis not present

## 2019-07-01 DIAGNOSIS — A419 Sepsis, unspecified organism: Secondary | ICD-10-CM | POA: Diagnosis not present

## 2019-07-01 DIAGNOSIS — I10 Essential (primary) hypertension: Secondary | ICD-10-CM | POA: Diagnosis not present

## 2019-07-02 DIAGNOSIS — K521 Toxic gastroenteritis and colitis: Secondary | ICD-10-CM | POA: Diagnosis not present

## 2019-07-02 DIAGNOSIS — I272 Pulmonary hypertension, unspecified: Secondary | ICD-10-CM | POA: Diagnosis not present

## 2019-07-02 DIAGNOSIS — I083 Combined rheumatic disorders of mitral, aortic and tricuspid valves: Secondary | ICD-10-CM | POA: Diagnosis not present

## 2019-07-02 DIAGNOSIS — D649 Anemia, unspecified: Secondary | ICD-10-CM | POA: Diagnosis not present

## 2019-07-02 DIAGNOSIS — R410 Disorientation, unspecified: Secondary | ICD-10-CM | POA: Diagnosis not present

## 2019-07-02 DIAGNOSIS — I10 Essential (primary) hypertension: Secondary | ICD-10-CM | POA: Diagnosis not present

## 2019-07-02 DIAGNOSIS — F05 Delirium due to known physiological condition: Secondary | ICD-10-CM | POA: Diagnosis not present

## 2019-07-02 DIAGNOSIS — N179 Acute kidney failure, unspecified: Secondary | ICD-10-CM | POA: Diagnosis not present

## 2019-07-02 DIAGNOSIS — Z48816 Encounter for surgical aftercare following surgery on the genitourinary system: Secondary | ICD-10-CM | POA: Diagnosis not present

## 2019-07-02 DIAGNOSIS — G9341 Metabolic encephalopathy: Secondary | ICD-10-CM | POA: Diagnosis not present

## 2019-07-02 DIAGNOSIS — R6520 Severe sepsis without septic shock: Secondary | ICD-10-CM | POA: Diagnosis not present

## 2019-07-02 DIAGNOSIS — N132 Hydronephrosis with renal and ureteral calculous obstruction: Secondary | ICD-10-CM | POA: Diagnosis not present

## 2019-07-02 DIAGNOSIS — T839XXD Unspecified complication of genitourinary prosthetic device, implant and graft, subsequent encounter: Secondary | ICD-10-CM | POA: Diagnosis not present

## 2019-07-02 DIAGNOSIS — R7881 Bacteremia: Secondary | ICD-10-CM | POA: Diagnosis not present

## 2019-07-02 DIAGNOSIS — M6281 Muscle weakness (generalized): Secondary | ICD-10-CM | POA: Diagnosis not present

## 2019-07-02 DIAGNOSIS — M5137 Other intervertebral disc degeneration, lumbosacral region: Secondary | ICD-10-CM | POA: Diagnosis not present

## 2019-07-02 DIAGNOSIS — M5136 Other intervertebral disc degeneration, lumbar region: Secondary | ICD-10-CM | POA: Diagnosis not present

## 2019-07-02 DIAGNOSIS — N318 Other neuromuscular dysfunction of bladder: Secondary | ICD-10-CM | POA: Diagnosis not present

## 2019-07-02 DIAGNOSIS — N319 Neuromuscular dysfunction of bladder, unspecified: Secondary | ICD-10-CM | POA: Diagnosis not present

## 2019-07-02 DIAGNOSIS — A4152 Sepsis due to Pseudomonas: Secondary | ICD-10-CM | POA: Diagnosis not present

## 2019-07-02 DIAGNOSIS — N201 Calculus of ureter: Secondary | ICD-10-CM | POA: Diagnosis not present

## 2019-07-02 DIAGNOSIS — R0902 Hypoxemia: Secondary | ICD-10-CM | POA: Diagnosis not present

## 2019-07-02 DIAGNOSIS — L89154 Pressure ulcer of sacral region, stage 4: Secondary | ICD-10-CM | POA: Diagnosis not present

## 2019-07-02 DIAGNOSIS — H2513 Age-related nuclear cataract, bilateral: Secondary | ICD-10-CM | POA: Diagnosis not present

## 2019-07-02 DIAGNOSIS — M48062 Spinal stenosis, lumbar region with neurogenic claudication: Secondary | ICD-10-CM | POA: Diagnosis not present

## 2019-07-02 DIAGNOSIS — A419 Sepsis, unspecified organism: Secondary | ICD-10-CM | POA: Diagnosis not present

## 2019-07-02 DIAGNOSIS — B964 Proteus (mirabilis) (morganii) as the cause of diseases classified elsewhere: Secondary | ICD-10-CM | POA: Diagnosis not present

## 2019-07-02 DIAGNOSIS — N39490 Overflow incontinence: Secondary | ICD-10-CM | POA: Diagnosis not present

## 2019-07-02 DIAGNOSIS — T361X5A Adverse effect of cephalosporins and other beta-lactam antibiotics, initial encounter: Secondary | ICD-10-CM | POA: Diagnosis not present

## 2019-07-02 DIAGNOSIS — E785 Hyperlipidemia, unspecified: Secondary | ICD-10-CM | POA: Diagnosis not present

## 2019-07-02 DIAGNOSIS — R197 Diarrhea, unspecified: Secondary | ICD-10-CM | POA: Diagnosis not present

## 2019-07-02 DIAGNOSIS — A4159 Other Gram-negative sepsis: Secondary | ICD-10-CM | POA: Diagnosis not present

## 2019-07-02 DIAGNOSIS — Z20828 Contact with and (suspected) exposure to other viral communicable diseases: Secondary | ICD-10-CM | POA: Diagnosis not present

## 2019-07-02 DIAGNOSIS — G629 Polyneuropathy, unspecified: Secondary | ICD-10-CM | POA: Diagnosis not present

## 2019-07-02 DIAGNOSIS — E669 Obesity, unspecified: Secondary | ICD-10-CM | POA: Diagnosis not present

## 2019-07-02 DIAGNOSIS — N136 Pyonephrosis: Secondary | ICD-10-CM | POA: Diagnosis not present

## 2019-07-02 DIAGNOSIS — E876 Hypokalemia: Secondary | ICD-10-CM | POA: Diagnosis not present

## 2019-07-02 DIAGNOSIS — G92 Toxic encephalopathy: Secondary | ICD-10-CM | POA: Diagnosis not present

## 2019-07-02 DIAGNOSIS — Z7409 Other reduced mobility: Secondary | ICD-10-CM | POA: Diagnosis not present

## 2019-07-03 DIAGNOSIS — N201 Calculus of ureter: Secondary | ICD-10-CM | POA: Diagnosis not present

## 2019-07-03 DIAGNOSIS — A419 Sepsis, unspecified organism: Secondary | ICD-10-CM | POA: Diagnosis not present

## 2019-07-03 DIAGNOSIS — N179 Acute kidney failure, unspecified: Secondary | ICD-10-CM | POA: Diagnosis not present

## 2019-07-03 DIAGNOSIS — R7881 Bacteremia: Secondary | ICD-10-CM | POA: Diagnosis not present

## 2019-07-03 DIAGNOSIS — R410 Disorientation, unspecified: Secondary | ICD-10-CM | POA: Diagnosis not present

## 2019-07-04 DIAGNOSIS — R410 Disorientation, unspecified: Secondary | ICD-10-CM | POA: Diagnosis not present

## 2019-07-04 DIAGNOSIS — N179 Acute kidney failure, unspecified: Secondary | ICD-10-CM | POA: Diagnosis not present

## 2019-07-04 DIAGNOSIS — A419 Sepsis, unspecified organism: Secondary | ICD-10-CM | POA: Diagnosis not present

## 2019-07-04 DIAGNOSIS — N201 Calculus of ureter: Secondary | ICD-10-CM | POA: Diagnosis not present

## 2019-07-09 DIAGNOSIS — N200 Calculus of kidney: Secondary | ICD-10-CM | POA: Diagnosis not present

## 2019-07-09 DIAGNOSIS — A419 Sepsis, unspecified organism: Secondary | ICD-10-CM | POA: Diagnosis not present

## 2019-07-09 DIAGNOSIS — T83511D Infection and inflammatory reaction due to indwelling urethral catheter, subsequent encounter: Secondary | ICD-10-CM | POA: Diagnosis not present

## 2019-07-09 DIAGNOSIS — R410 Disorientation, unspecified: Secondary | ICD-10-CM | POA: Diagnosis not present

## 2019-07-09 DIAGNOSIS — T839XXD Unspecified complication of genitourinary prosthetic device, implant and graft, subsequent encounter: Secondary | ICD-10-CM | POA: Diagnosis not present

## 2019-07-09 DIAGNOSIS — M6281 Muscle weakness (generalized): Secondary | ICD-10-CM | POA: Diagnosis not present

## 2019-07-09 DIAGNOSIS — A4152 Sepsis due to Pseudomonas: Secondary | ICD-10-CM | POA: Diagnosis not present

## 2019-07-09 DIAGNOSIS — Z79899 Other long term (current) drug therapy: Secondary | ICD-10-CM | POA: Diagnosis not present

## 2019-07-09 DIAGNOSIS — R197 Diarrhea, unspecified: Secondary | ICD-10-CM | POA: Diagnosis not present

## 2019-07-09 DIAGNOSIS — R7881 Bacteremia: Secondary | ICD-10-CM | POA: Diagnosis not present

## 2019-07-09 DIAGNOSIS — D649 Anemia, unspecified: Secondary | ICD-10-CM | POA: Diagnosis not present

## 2019-07-09 DIAGNOSIS — N390 Urinary tract infection, site not specified: Secondary | ICD-10-CM | POA: Diagnosis not present

## 2019-07-09 DIAGNOSIS — Z48816 Encounter for surgical aftercare following surgery on the genitourinary system: Secondary | ICD-10-CM | POA: Diagnosis not present

## 2019-07-09 DIAGNOSIS — N319 Neuromuscular dysfunction of bladder, unspecified: Secondary | ICD-10-CM | POA: Diagnosis not present

## 2019-07-09 DIAGNOSIS — L89154 Pressure ulcer of sacral region, stage 4: Secondary | ICD-10-CM | POA: Diagnosis not present

## 2019-07-09 DIAGNOSIS — N201 Calculus of ureter: Secondary | ICD-10-CM | POA: Diagnosis not present

## 2019-07-11 DIAGNOSIS — A419 Sepsis, unspecified organism: Secondary | ICD-10-CM | POA: Diagnosis not present

## 2019-07-11 DIAGNOSIS — N319 Neuromuscular dysfunction of bladder, unspecified: Secondary | ICD-10-CM | POA: Diagnosis not present

## 2019-07-11 DIAGNOSIS — R410 Disorientation, unspecified: Secondary | ICD-10-CM | POA: Diagnosis not present

## 2019-07-11 DIAGNOSIS — L89154 Pressure ulcer of sacral region, stage 4: Secondary | ICD-10-CM | POA: Diagnosis not present

## 2019-07-17 DIAGNOSIS — N200 Calculus of kidney: Secondary | ICD-10-CM | POA: Diagnosis not present

## 2019-07-17 DIAGNOSIS — N319 Neuromuscular dysfunction of bladder, unspecified: Secondary | ICD-10-CM | POA: Diagnosis not present

## 2019-07-17 DIAGNOSIS — N390 Urinary tract infection, site not specified: Secondary | ICD-10-CM | POA: Diagnosis not present

## 2019-07-17 DIAGNOSIS — N201 Calculus of ureter: Secondary | ICD-10-CM | POA: Diagnosis not present

## 2019-07-17 DIAGNOSIS — T83511D Infection and inflammatory reaction due to indwelling urethral catheter, subsequent encounter: Secondary | ICD-10-CM | POA: Diagnosis not present

## 2019-07-18 DIAGNOSIS — N319 Neuromuscular dysfunction of bladder, unspecified: Secondary | ICD-10-CM | POA: Diagnosis not present

## 2019-07-18 DIAGNOSIS — Z48816 Encounter for surgical aftercare following surgery on the genitourinary system: Secondary | ICD-10-CM | POA: Diagnosis not present

## 2019-07-18 DIAGNOSIS — A419 Sepsis, unspecified organism: Secondary | ICD-10-CM | POA: Diagnosis not present

## 2019-07-18 DIAGNOSIS — M6281 Muscle weakness (generalized): Secondary | ICD-10-CM | POA: Diagnosis not present

## 2019-07-23 DIAGNOSIS — L89154 Pressure ulcer of sacral region, stage 4: Secondary | ICD-10-CM | POA: Diagnosis not present

## 2019-07-25 DIAGNOSIS — N200 Calculus of kidney: Secondary | ICD-10-CM | POA: Diagnosis not present

## 2019-07-25 DIAGNOSIS — N209 Urinary calculus, unspecified: Secondary | ICD-10-CM | POA: Diagnosis not present

## 2019-07-25 DIAGNOSIS — Z960 Presence of urogenital implants: Secondary | ICD-10-CM | POA: Diagnosis not present

## 2019-07-28 DIAGNOSIS — N319 Neuromuscular dysfunction of bladder, unspecified: Secondary | ICD-10-CM | POA: Diagnosis not present

## 2019-07-28 DIAGNOSIS — Z20828 Contact with and (suspected) exposure to other viral communicable diseases: Secondary | ICD-10-CM | POA: Diagnosis not present

## 2019-07-30 DIAGNOSIS — L89154 Pressure ulcer of sacral region, stage 4: Secondary | ICD-10-CM | POA: Diagnosis not present

## 2019-07-30 DIAGNOSIS — U071 COVID-19: Secondary | ICD-10-CM | POA: Diagnosis not present

## 2019-08-06 DIAGNOSIS — N319 Neuromuscular dysfunction of bladder, unspecified: Secondary | ICD-10-CM | POA: Diagnosis not present

## 2019-08-06 DIAGNOSIS — Z48816 Encounter for surgical aftercare following surgery on the genitourinary system: Secondary | ICD-10-CM | POA: Diagnosis not present

## 2019-08-06 DIAGNOSIS — M6281 Muscle weakness (generalized): Secondary | ICD-10-CM | POA: Diagnosis not present

## 2019-08-12 DIAGNOSIS — N390 Urinary tract infection, site not specified: Secondary | ICD-10-CM | POA: Diagnosis not present

## 2019-08-12 DIAGNOSIS — L89154 Pressure ulcer of sacral region, stage 4: Secondary | ICD-10-CM | POA: Diagnosis not present

## 2019-08-12 DIAGNOSIS — U071 COVID-19: Secondary | ICD-10-CM | POA: Diagnosis not present

## 2019-08-12 DIAGNOSIS — I10 Essential (primary) hypertension: Secondary | ICD-10-CM | POA: Diagnosis not present

## 2019-08-13 DIAGNOSIS — N390 Urinary tract infection, site not specified: Secondary | ICD-10-CM | POA: Diagnosis not present

## 2019-08-13 DIAGNOSIS — L89154 Pressure ulcer of sacral region, stage 4: Secondary | ICD-10-CM | POA: Diagnosis not present

## 2019-08-13 DIAGNOSIS — R319 Hematuria, unspecified: Secondary | ICD-10-CM | POA: Diagnosis not present

## 2019-08-25 DIAGNOSIS — N319 Neuromuscular dysfunction of bladder, unspecified: Secondary | ICD-10-CM | POA: Diagnosis not present

## 2019-09-02 DIAGNOSIS — U071 COVID-19: Secondary | ICD-10-CM | POA: Diagnosis not present

## 2019-09-02 DIAGNOSIS — G8929 Other chronic pain: Secondary | ICD-10-CM | POA: Diagnosis not present

## 2019-09-02 DIAGNOSIS — N319 Neuromuscular dysfunction of bladder, unspecified: Secondary | ICD-10-CM | POA: Diagnosis not present

## 2019-09-02 DIAGNOSIS — N200 Calculus of kidney: Secondary | ICD-10-CM | POA: Diagnosis not present

## 2019-09-03 DIAGNOSIS — M21371 Foot drop, right foot: Secondary | ICD-10-CM | POA: Diagnosis not present

## 2019-09-03 DIAGNOSIS — N319 Neuromuscular dysfunction of bladder, unspecified: Secondary | ICD-10-CM | POA: Diagnosis not present

## 2019-09-03 DIAGNOSIS — I639 Cerebral infarction, unspecified: Secondary | ICD-10-CM | POA: Diagnosis not present

## 2019-09-03 DIAGNOSIS — N179 Acute kidney failure, unspecified: Secondary | ICD-10-CM | POA: Diagnosis not present

## 2019-09-03 DIAGNOSIS — G9349 Other encephalopathy: Secondary | ICD-10-CM | POA: Diagnosis not present

## 2019-09-03 DIAGNOSIS — M21372 Foot drop, left foot: Secondary | ICD-10-CM | POA: Diagnosis not present

## 2019-09-03 DIAGNOSIS — N200 Calculus of kidney: Secondary | ICD-10-CM | POA: Diagnosis not present

## 2019-09-03 DIAGNOSIS — K219 Gastro-esophageal reflux disease without esophagitis: Secondary | ICD-10-CM | POA: Diagnosis not present

## 2019-09-03 DIAGNOSIS — R9431 Abnormal electrocardiogram [ECG] [EKG]: Secondary | ICD-10-CM | POA: Diagnosis not present

## 2019-09-03 DIAGNOSIS — Z8616 Personal history of COVID-19: Secondary | ICD-10-CM | POA: Diagnosis not present

## 2019-09-03 DIAGNOSIS — I10 Essential (primary) hypertension: Secondary | ICD-10-CM | POA: Diagnosis not present

## 2019-09-03 DIAGNOSIS — R4182 Altered mental status, unspecified: Secondary | ICD-10-CM | POA: Diagnosis not present

## 2019-09-03 DIAGNOSIS — I351 Nonrheumatic aortic (valve) insufficiency: Secondary | ICD-10-CM | POA: Diagnosis not present

## 2019-09-03 DIAGNOSIS — M5412 Radiculopathy, cervical region: Secondary | ICD-10-CM | POA: Diagnosis not present

## 2019-09-03 DIAGNOSIS — N136 Pyonephrosis: Secondary | ICD-10-CM | POA: Diagnosis not present

## 2019-09-03 DIAGNOSIS — R778 Other specified abnormalities of plasma proteins: Secondary | ICD-10-CM | POA: Diagnosis not present

## 2019-09-03 DIAGNOSIS — M5136 Other intervertebral disc degeneration, lumbar region: Secondary | ICD-10-CM | POA: Diagnosis not present

## 2019-09-03 DIAGNOSIS — U071 COVID-19: Secondary | ICD-10-CM | POA: Diagnosis not present

## 2019-09-03 DIAGNOSIS — T191XXA Foreign body in bladder, initial encounter: Secondary | ICD-10-CM | POA: Diagnosis not present

## 2019-09-03 DIAGNOSIS — R479 Unspecified speech disturbances: Secondary | ICD-10-CM | POA: Diagnosis not present

## 2019-09-03 DIAGNOSIS — G459 Transient cerebral ischemic attack, unspecified: Secondary | ICD-10-CM | POA: Diagnosis not present

## 2019-09-03 DIAGNOSIS — N202 Calculus of kidney with calculus of ureter: Secondary | ICD-10-CM | POA: Diagnosis not present

## 2019-09-03 DIAGNOSIS — T83192A Other mechanical complication of urinary stent, initial encounter: Secondary | ICD-10-CM | POA: Diagnosis not present

## 2019-09-03 DIAGNOSIS — I2510 Atherosclerotic heart disease of native coronary artery without angina pectoris: Secondary | ICD-10-CM | POA: Diagnosis not present

## 2019-09-03 DIAGNOSIS — B9689 Other specified bacterial agents as the cause of diseases classified elsewhere: Secondary | ICD-10-CM | POA: Diagnosis not present

## 2019-09-03 DIAGNOSIS — A419 Sepsis, unspecified organism: Secondary | ICD-10-CM | POA: Diagnosis not present

## 2019-09-03 DIAGNOSIS — E785 Hyperlipidemia, unspecified: Secondary | ICD-10-CM | POA: Diagnosis not present

## 2019-09-03 DIAGNOSIS — N318 Other neuromuscular dysfunction of bladder: Secondary | ICD-10-CM | POA: Diagnosis not present

## 2019-09-03 DIAGNOSIS — Z8619 Personal history of other infectious and parasitic diseases: Secondary | ICD-10-CM | POA: Diagnosis not present

## 2019-09-03 DIAGNOSIS — G8191 Hemiplegia, unspecified affecting right dominant side: Secondary | ICD-10-CM | POA: Diagnosis not present

## 2019-09-03 DIAGNOSIS — T83518A Infection and inflammatory reaction due to other urinary catheter, initial encounter: Secondary | ICD-10-CM | POA: Diagnosis not present

## 2019-09-03 DIAGNOSIS — N390 Urinary tract infection, site not specified: Secondary | ICD-10-CM | POA: Diagnosis not present

## 2019-09-03 DIAGNOSIS — G603 Idiopathic progressive neuropathy: Secondary | ICD-10-CM | POA: Diagnosis not present

## 2019-09-03 DIAGNOSIS — N132 Hydronephrosis with renal and ureteral calculous obstruction: Secondary | ICD-10-CM | POA: Diagnosis not present

## 2019-09-03 DIAGNOSIS — I509 Heart failure, unspecified: Secondary | ICD-10-CM | POA: Diagnosis not present

## 2019-09-03 DIAGNOSIS — R000 Tachycardia, unspecified: Secondary | ICD-10-CM | POA: Diagnosis not present

## 2019-09-03 DIAGNOSIS — I6523 Occlusion and stenosis of bilateral carotid arteries: Secondary | ICD-10-CM | POA: Diagnosis not present

## 2019-09-03 DIAGNOSIS — R0602 Shortness of breath: Secondary | ICD-10-CM | POA: Diagnosis not present

## 2019-09-03 DIAGNOSIS — L89154 Pressure ulcer of sacral region, stage 4: Secondary | ICD-10-CM | POA: Diagnosis not present

## 2019-09-03 DIAGNOSIS — R0902 Hypoxemia: Secondary | ICD-10-CM | POA: Diagnosis not present

## 2019-09-03 DIAGNOSIS — A4189 Other specified sepsis: Secondary | ICD-10-CM | POA: Diagnosis not present

## 2019-09-03 DIAGNOSIS — B9629 Other Escherichia coli [E. coli] as the cause of diseases classified elsewhere: Secondary | ICD-10-CM | POA: Diagnosis not present

## 2019-09-03 DIAGNOSIS — R4781 Slurred speech: Secondary | ICD-10-CM | POA: Diagnosis not present

## 2019-09-03 DIAGNOSIS — A4150 Gram-negative sepsis, unspecified: Secondary | ICD-10-CM | POA: Diagnosis not present

## 2019-09-03 DIAGNOSIS — T83511A Infection and inflammatory reaction due to indwelling urethral catheter, initial encounter: Secondary | ICD-10-CM | POA: Diagnosis not present

## 2019-09-03 DIAGNOSIS — Z452 Encounter for adjustment and management of vascular access device: Secondary | ICD-10-CM | POA: Diagnosis not present

## 2019-09-03 DIAGNOSIS — Z1612 Extended spectrum beta lactamase (ESBL) resistance: Secondary | ICD-10-CM | POA: Diagnosis not present

## 2019-09-03 DIAGNOSIS — I517 Cardiomegaly: Secondary | ICD-10-CM | POA: Diagnosis not present

## 2019-09-03 DIAGNOSIS — R7881 Bacteremia: Secondary | ICD-10-CM | POA: Diagnosis not present

## 2019-09-03 DIAGNOSIS — R404 Transient alteration of awareness: Secondary | ICD-10-CM | POA: Diagnosis not present

## 2019-09-03 DIAGNOSIS — T83122A Displacement of urinary stent, initial encounter: Secondary | ICD-10-CM | POA: Diagnosis not present

## 2019-09-03 DIAGNOSIS — I248 Other forms of acute ischemic heart disease: Secondary | ICD-10-CM | POA: Diagnosis not present

## 2019-09-03 DIAGNOSIS — I110 Hypertensive heart disease with heart failure: Secondary | ICD-10-CM | POA: Diagnosis not present

## 2019-09-03 DIAGNOSIS — S199XXA Unspecified injury of neck, initial encounter: Secondary | ICD-10-CM | POA: Diagnosis not present

## 2019-09-03 DIAGNOSIS — Z993 Dependence on wheelchair: Secondary | ICD-10-CM | POA: Diagnosis not present

## 2019-09-03 DIAGNOSIS — B961 Klebsiella pneumoniae [K. pneumoniae] as the cause of diseases classified elsewhere: Secondary | ICD-10-CM | POA: Diagnosis not present

## 2019-09-03 DIAGNOSIS — J189 Pneumonia, unspecified organism: Secondary | ICD-10-CM | POA: Diagnosis not present

## 2019-09-03 DIAGNOSIS — G629 Polyneuropathy, unspecified: Secondary | ICD-10-CM | POA: Diagnosis not present

## 2019-09-09 DIAGNOSIS — N202 Calculus of kidney with calculus of ureter: Secondary | ICD-10-CM | POA: Diagnosis not present

## 2019-09-09 DIAGNOSIS — T83192A Other mechanical complication of urinary stent, initial encounter: Secondary | ICD-10-CM | POA: Diagnosis not present

## 2019-09-13 DIAGNOSIS — N319 Neuromuscular dysfunction of bladder, unspecified: Secondary | ICD-10-CM | POA: Diagnosis not present

## 2019-09-13 DIAGNOSIS — M6281 Muscle weakness (generalized): Secondary | ICD-10-CM | POA: Diagnosis not present

## 2019-09-13 DIAGNOSIS — T839XXD Unspecified complication of genitourinary prosthetic device, implant and graft, subsequent encounter: Secondary | ICD-10-CM | POA: Diagnosis not present

## 2019-09-13 DIAGNOSIS — A419 Sepsis, unspecified organism: Secondary | ICD-10-CM | POA: Diagnosis not present

## 2019-09-13 DIAGNOSIS — Z48816 Encounter for surgical aftercare following surgery on the genitourinary system: Secondary | ICD-10-CM | POA: Diagnosis not present

## 2019-09-15 DIAGNOSIS — A419 Sepsis, unspecified organism: Secondary | ICD-10-CM | POA: Diagnosis not present

## 2019-09-15 DIAGNOSIS — N200 Calculus of kidney: Secondary | ICD-10-CM | POA: Diagnosis not present

## 2019-09-15 DIAGNOSIS — R7881 Bacteremia: Secondary | ICD-10-CM | POA: Diagnosis not present

## 2019-09-15 DIAGNOSIS — B961 Klebsiella pneumoniae [K. pneumoniae] as the cause of diseases classified elsewhere: Secondary | ICD-10-CM | POA: Diagnosis not present

## 2019-09-16 DIAGNOSIS — Z79899 Other long term (current) drug therapy: Secondary | ICD-10-CM | POA: Diagnosis not present

## 2019-09-16 DIAGNOSIS — D649 Anemia, unspecified: Secondary | ICD-10-CM | POA: Diagnosis not present

## 2019-09-17 DIAGNOSIS — N200 Calculus of kidney: Secondary | ICD-10-CM | POA: Diagnosis not present

## 2019-09-18 DIAGNOSIS — Z79899 Other long term (current) drug therapy: Secondary | ICD-10-CM | POA: Diagnosis not present

## 2019-09-18 DIAGNOSIS — D649 Anemia, unspecified: Secondary | ICD-10-CM | POA: Diagnosis not present

## 2019-09-20 DIAGNOSIS — D649 Anemia, unspecified: Secondary | ICD-10-CM | POA: Diagnosis not present

## 2019-09-20 DIAGNOSIS — T83092A Other mechanical complication of nephrostomy catheter, initial encounter: Secondary | ICD-10-CM | POA: Diagnosis not present

## 2019-09-20 DIAGNOSIS — N189 Chronic kidney disease, unspecified: Secondary | ICD-10-CM | POA: Diagnosis not present

## 2019-09-20 DIAGNOSIS — N390 Urinary tract infection, site not specified: Secondary | ICD-10-CM | POA: Diagnosis not present

## 2019-09-23 DIAGNOSIS — L89154 Pressure ulcer of sacral region, stage 4: Secondary | ICD-10-CM | POA: Diagnosis not present

## 2019-09-23 DIAGNOSIS — T83022A Displacement of nephrostomy catheter, initial encounter: Secondary | ICD-10-CM | POA: Diagnosis not present

## 2019-09-23 DIAGNOSIS — R5381 Other malaise: Secondary | ICD-10-CM | POA: Diagnosis not present

## 2019-10-02 DIAGNOSIS — Z20828 Contact with and (suspected) exposure to other viral communicable diseases: Secondary | ICD-10-CM | POA: Diagnosis not present

## 2019-10-02 DIAGNOSIS — N319 Neuromuscular dysfunction of bladder, unspecified: Secondary | ICD-10-CM | POA: Diagnosis not present

## 2019-10-06 DIAGNOSIS — T839XXD Unspecified complication of genitourinary prosthetic device, implant and graft, subsequent encounter: Secondary | ICD-10-CM | POA: Diagnosis not present

## 2019-10-06 DIAGNOSIS — N319 Neuromuscular dysfunction of bladder, unspecified: Secondary | ICD-10-CM | POA: Diagnosis not present

## 2019-10-06 DIAGNOSIS — A419 Sepsis, unspecified organism: Secondary | ICD-10-CM | POA: Diagnosis not present

## 2019-10-06 DIAGNOSIS — Z48816 Encounter for surgical aftercare following surgery on the genitourinary system: Secondary | ICD-10-CM | POA: Diagnosis not present

## 2019-10-09 DIAGNOSIS — Z20822 Contact with and (suspected) exposure to covid-19: Secondary | ICD-10-CM | POA: Diagnosis not present

## 2019-10-09 DIAGNOSIS — H9190 Unspecified hearing loss, unspecified ear: Secondary | ICD-10-CM | POA: Diagnosis not present

## 2019-10-09 DIAGNOSIS — R5082 Postprocedural fever: Secondary | ICD-10-CM | POA: Diagnosis not present

## 2019-10-09 DIAGNOSIS — I10 Essential (primary) hypertension: Secondary | ICD-10-CM | POA: Diagnosis not present

## 2019-10-09 DIAGNOSIS — M5136 Other intervertebral disc degeneration, lumbar region: Secondary | ICD-10-CM | POA: Diagnosis not present

## 2019-10-09 DIAGNOSIS — Z8616 Personal history of COVID-19: Secondary | ICD-10-CM | POA: Diagnosis not present

## 2019-10-09 DIAGNOSIS — K219 Gastro-esophageal reflux disease without esophagitis: Secondary | ICD-10-CM | POA: Diagnosis not present

## 2019-10-09 DIAGNOSIS — Z993 Dependence on wheelchair: Secondary | ICD-10-CM | POA: Diagnosis not present

## 2019-10-09 DIAGNOSIS — N319 Neuromuscular dysfunction of bladder, unspecified: Secondary | ICD-10-CM | POA: Diagnosis not present

## 2019-10-09 DIAGNOSIS — N200 Calculus of kidney: Secondary | ICD-10-CM | POA: Diagnosis not present

## 2019-10-09 DIAGNOSIS — Z79899 Other long term (current) drug therapy: Secondary | ICD-10-CM | POA: Diagnosis not present

## 2019-10-09 DIAGNOSIS — G629 Polyneuropathy, unspecified: Secondary | ICD-10-CM | POA: Diagnosis not present

## 2019-10-09 DIAGNOSIS — Z8249 Family history of ischemic heart disease and other diseases of the circulatory system: Secondary | ICD-10-CM | POA: Diagnosis not present

## 2019-10-10 DIAGNOSIS — N319 Neuromuscular dysfunction of bladder, unspecified: Secondary | ICD-10-CM | POA: Diagnosis not present

## 2019-10-10 DIAGNOSIS — Z20822 Contact with and (suspected) exposure to covid-19: Secondary | ICD-10-CM | POA: Diagnosis not present

## 2019-10-10 DIAGNOSIS — I10 Essential (primary) hypertension: Secondary | ICD-10-CM | POA: Diagnosis not present

## 2019-10-10 DIAGNOSIS — Z993 Dependence on wheelchair: Secondary | ICD-10-CM | POA: Diagnosis not present

## 2019-10-10 DIAGNOSIS — Z79899 Other long term (current) drug therapy: Secondary | ICD-10-CM | POA: Diagnosis not present

## 2019-10-10 DIAGNOSIS — K219 Gastro-esophageal reflux disease without esophagitis: Secondary | ICD-10-CM | POA: Diagnosis not present

## 2019-10-10 DIAGNOSIS — G629 Polyneuropathy, unspecified: Secondary | ICD-10-CM | POA: Diagnosis not present

## 2019-10-10 DIAGNOSIS — R5082 Postprocedural fever: Secondary | ICD-10-CM | POA: Diagnosis not present

## 2019-10-10 DIAGNOSIS — M5136 Other intervertebral disc degeneration, lumbar region: Secondary | ICD-10-CM | POA: Diagnosis not present

## 2019-10-10 DIAGNOSIS — Z8616 Personal history of COVID-19: Secondary | ICD-10-CM | POA: Diagnosis not present

## 2019-10-10 DIAGNOSIS — N200 Calculus of kidney: Secondary | ICD-10-CM | POA: Diagnosis not present

## 2019-10-10 DIAGNOSIS — Z8249 Family history of ischemic heart disease and other diseases of the circulatory system: Secondary | ICD-10-CM | POA: Diagnosis not present

## 2019-10-10 DIAGNOSIS — H9190 Unspecified hearing loss, unspecified ear: Secondary | ICD-10-CM | POA: Diagnosis not present

## 2019-10-14 DIAGNOSIS — A419 Sepsis, unspecified organism: Secondary | ICD-10-CM | POA: Diagnosis not present

## 2019-10-14 DIAGNOSIS — M6281 Muscle weakness (generalized): Secondary | ICD-10-CM | POA: Diagnosis not present

## 2019-10-14 DIAGNOSIS — T839XXD Unspecified complication of genitourinary prosthetic device, implant and graft, subsequent encounter: Secondary | ICD-10-CM | POA: Diagnosis not present

## 2019-10-14 DIAGNOSIS — Z48816 Encounter for surgical aftercare following surgery on the genitourinary system: Secondary | ICD-10-CM | POA: Diagnosis not present

## 2019-10-14 DIAGNOSIS — N319 Neuromuscular dysfunction of bladder, unspecified: Secondary | ICD-10-CM | POA: Diagnosis not present

## 2019-10-15 DIAGNOSIS — D649 Anemia, unspecified: Secondary | ICD-10-CM | POA: Diagnosis not present

## 2019-10-15 DIAGNOSIS — Z79899 Other long term (current) drug therapy: Secondary | ICD-10-CM | POA: Diagnosis not present

## 2019-10-16 DIAGNOSIS — A498 Other bacterial infections of unspecified site: Secondary | ICD-10-CM | POA: Diagnosis not present

## 2019-10-16 DIAGNOSIS — N319 Neuromuscular dysfunction of bladder, unspecified: Secondary | ICD-10-CM | POA: Diagnosis not present

## 2019-10-16 DIAGNOSIS — L89154 Pressure ulcer of sacral region, stage 4: Secondary | ICD-10-CM | POA: Diagnosis not present

## 2019-10-16 DIAGNOSIS — T83092A Other mechanical complication of nephrostomy catheter, initial encounter: Secondary | ICD-10-CM | POA: Diagnosis not present

## 2019-10-17 DIAGNOSIS — N319 Neuromuscular dysfunction of bladder, unspecified: Secondary | ICD-10-CM | POA: Diagnosis not present

## 2019-10-17 DIAGNOSIS — N390 Urinary tract infection, site not specified: Secondary | ICD-10-CM | POA: Diagnosis not present

## 2019-10-17 DIAGNOSIS — T83022A Displacement of nephrostomy catheter, initial encounter: Secondary | ICD-10-CM | POA: Diagnosis not present

## 2019-10-17 DIAGNOSIS — N200 Calculus of kidney: Secondary | ICD-10-CM | POA: Diagnosis not present

## 2019-10-24 DIAGNOSIS — Z20820 Contact with and (suspected) exposure to varicella: Secondary | ICD-10-CM | POA: Diagnosis not present

## 2019-10-29 DIAGNOSIS — K8020 Calculus of gallbladder without cholecystitis without obstruction: Secondary | ICD-10-CM | POA: Diagnosis not present

## 2019-10-29 DIAGNOSIS — N132 Hydronephrosis with renal and ureteral calculous obstruction: Secondary | ICD-10-CM | POA: Diagnosis not present

## 2019-10-30 DIAGNOSIS — Z87442 Personal history of urinary calculi: Secondary | ICD-10-CM | POA: Diagnosis not present

## 2019-10-30 DIAGNOSIS — N201 Calculus of ureter: Secondary | ICD-10-CM | POA: Diagnosis not present

## 2019-11-03 DIAGNOSIS — D649 Anemia, unspecified: Secondary | ICD-10-CM | POA: Diagnosis not present

## 2019-11-03 DIAGNOSIS — N3020 Other chronic cystitis without hematuria: Secondary | ICD-10-CM | POA: Diagnosis not present

## 2019-11-03 DIAGNOSIS — N200 Calculus of kidney: Secondary | ICD-10-CM | POA: Diagnosis not present

## 2019-11-03 DIAGNOSIS — N319 Neuromuscular dysfunction of bladder, unspecified: Secondary | ICD-10-CM | POA: Diagnosis not present

## 2019-11-04 DIAGNOSIS — D649 Anemia, unspecified: Secondary | ICD-10-CM | POA: Diagnosis not present

## 2019-11-04 DIAGNOSIS — N319 Neuromuscular dysfunction of bladder, unspecified: Secondary | ICD-10-CM | POA: Diagnosis not present

## 2019-11-04 DIAGNOSIS — T83022A Displacement of nephrostomy catheter, initial encounter: Secondary | ICD-10-CM | POA: Diagnosis not present

## 2019-11-04 DIAGNOSIS — A419 Sepsis, unspecified organism: Secondary | ICD-10-CM | POA: Diagnosis not present

## 2019-11-04 DIAGNOSIS — N200 Calculus of kidney: Secondary | ICD-10-CM | POA: Diagnosis not present

## 2019-11-04 DIAGNOSIS — T839XXD Unspecified complication of genitourinary prosthetic device, implant and graft, subsequent encounter: Secondary | ICD-10-CM | POA: Diagnosis not present

## 2019-11-04 DIAGNOSIS — L89154 Pressure ulcer of sacral region, stage 4: Secondary | ICD-10-CM | POA: Diagnosis not present

## 2019-11-04 DIAGNOSIS — M6281 Muscle weakness (generalized): Secondary | ICD-10-CM | POA: Diagnosis not present

## 2019-11-04 DIAGNOSIS — N189 Chronic kidney disease, unspecified: Secondary | ICD-10-CM | POA: Diagnosis not present

## 2019-11-04 DIAGNOSIS — Z48816 Encounter for surgical aftercare following surgery on the genitourinary system: Secondary | ICD-10-CM | POA: Diagnosis not present

## 2019-11-04 DIAGNOSIS — N390 Urinary tract infection, site not specified: Secondary | ICD-10-CM | POA: Diagnosis not present

## 2019-11-10 DIAGNOSIS — N390 Urinary tract infection, site not specified: Secondary | ICD-10-CM | POA: Diagnosis not present

## 2019-11-11 DIAGNOSIS — L89152 Pressure ulcer of sacral region, stage 2: Secondary | ICD-10-CM | POA: Diagnosis not present

## 2019-11-11 DIAGNOSIS — N319 Neuromuscular dysfunction of bladder, unspecified: Secondary | ICD-10-CM | POA: Diagnosis not present

## 2019-11-11 DIAGNOSIS — T8189XD Other complications of procedures, not elsewhere classified, subsequent encounter: Secondary | ICD-10-CM | POA: Diagnosis not present

## 2019-11-18 DIAGNOSIS — Z960 Presence of urogenital implants: Secondary | ICD-10-CM | POA: Diagnosis not present

## 2019-11-18 DIAGNOSIS — N1330 Unspecified hydronephrosis: Secondary | ICD-10-CM | POA: Diagnosis not present

## 2019-11-18 DIAGNOSIS — Q6101 Congenital single renal cyst: Secondary | ICD-10-CM | POA: Diagnosis not present

## 2019-11-18 DIAGNOSIS — N281 Cyst of kidney, acquired: Secondary | ICD-10-CM | POA: Diagnosis not present

## 2019-11-18 DIAGNOSIS — Z466 Encounter for fitting and adjustment of urinary device: Secondary | ICD-10-CM | POA: Diagnosis not present

## 2019-11-18 DIAGNOSIS — N200 Calculus of kidney: Secondary | ICD-10-CM | POA: Diagnosis not present

## 2019-11-21 DIAGNOSIS — L89154 Pressure ulcer of sacral region, stage 4: Secondary | ICD-10-CM | POA: Diagnosis not present

## 2019-11-21 DIAGNOSIS — U071 COVID-19: Secondary | ICD-10-CM | POA: Diagnosis not present

## 2019-11-21 DIAGNOSIS — G629 Polyneuropathy, unspecified: Secondary | ICD-10-CM | POA: Diagnosis not present

## 2019-11-21 DIAGNOSIS — T83092A Other mechanical complication of nephrostomy catheter, initial encounter: Secondary | ICD-10-CM | POA: Diagnosis not present

## 2019-11-25 DIAGNOSIS — Z20828 Contact with and (suspected) exposure to other viral communicable diseases: Secondary | ICD-10-CM | POA: Diagnosis not present

## 2019-11-28 DIAGNOSIS — N3020 Other chronic cystitis without hematuria: Secondary | ICD-10-CM | POA: Diagnosis not present

## 2019-11-28 DIAGNOSIS — G629 Polyneuropathy, unspecified: Secondary | ICD-10-CM | POA: Diagnosis not present

## 2019-11-28 DIAGNOSIS — N200 Calculus of kidney: Secondary | ICD-10-CM | POA: Diagnosis not present

## 2019-11-28 DIAGNOSIS — N319 Neuromuscular dysfunction of bladder, unspecified: Secondary | ICD-10-CM | POA: Diagnosis not present

## 2019-12-01 DIAGNOSIS — N318 Other neuromuscular dysfunction of bladder: Secondary | ICD-10-CM | POA: Diagnosis not present

## 2019-12-01 DIAGNOSIS — K219 Gastro-esophageal reflux disease without esophagitis: Secondary | ICD-10-CM | POA: Diagnosis not present

## 2019-12-01 DIAGNOSIS — I10 Essential (primary) hypertension: Secondary | ICD-10-CM | POA: Diagnosis not present

## 2019-12-01 DIAGNOSIS — N368 Other specified disorders of urethra: Secondary | ICD-10-CM | POA: Diagnosis not present

## 2019-12-01 DIAGNOSIS — N398 Other specified disorders of urinary system: Secondary | ICD-10-CM | POA: Diagnosis not present

## 2019-12-01 DIAGNOSIS — D649 Anemia, unspecified: Secondary | ICD-10-CM | POA: Diagnosis not present

## 2019-12-01 DIAGNOSIS — R3981 Functional urinary incontinence: Secondary | ICD-10-CM | POA: Diagnosis not present

## 2019-12-01 DIAGNOSIS — Z87440 Personal history of urinary (tract) infections: Secondary | ICD-10-CM | POA: Diagnosis not present

## 2019-12-01 DIAGNOSIS — N319 Neuromuscular dysfunction of bladder, unspecified: Secondary | ICD-10-CM | POA: Diagnosis not present

## 2019-12-09 DIAGNOSIS — N319 Neuromuscular dysfunction of bladder, unspecified: Secondary | ICD-10-CM | POA: Diagnosis not present

## 2019-12-11 DIAGNOSIS — G609 Hereditary and idiopathic neuropathy, unspecified: Secondary | ICD-10-CM | POA: Diagnosis not present

## 2019-12-11 DIAGNOSIS — M5137 Other intervertebral disc degeneration, lumbosacral region: Secondary | ICD-10-CM | POA: Diagnosis not present

## 2019-12-11 DIAGNOSIS — M48062 Spinal stenosis, lumbar region with neurogenic claudication: Secondary | ICD-10-CM | POA: Diagnosis not present

## 2019-12-16 DIAGNOSIS — H02831 Dermatochalasis of right upper eyelid: Secondary | ICD-10-CM | POA: Diagnosis not present

## 2019-12-16 DIAGNOSIS — H2511 Age-related nuclear cataract, right eye: Secondary | ICD-10-CM | POA: Diagnosis not present

## 2019-12-16 DIAGNOSIS — H5203 Hypermetropia, bilateral: Secondary | ICD-10-CM | POA: Diagnosis not present

## 2019-12-16 DIAGNOSIS — H3589 Other specified retinal disorders: Secondary | ICD-10-CM | POA: Diagnosis not present

## 2019-12-16 DIAGNOSIS — H2513 Age-related nuclear cataract, bilateral: Secondary | ICD-10-CM | POA: Diagnosis not present

## 2019-12-16 DIAGNOSIS — H04123 Dry eye syndrome of bilateral lacrimal glands: Secondary | ICD-10-CM | POA: Diagnosis not present

## 2019-12-16 DIAGNOSIS — H25041 Posterior subcapsular polar age-related cataract, right eye: Secondary | ICD-10-CM | POA: Diagnosis not present

## 2019-12-16 DIAGNOSIS — H25043 Posterior subcapsular polar age-related cataract, bilateral: Secondary | ICD-10-CM | POA: Diagnosis not present

## 2019-12-16 DIAGNOSIS — H52203 Unspecified astigmatism, bilateral: Secondary | ICD-10-CM | POA: Diagnosis not present

## 2019-12-16 DIAGNOSIS — H11152 Pinguecula, left eye: Secondary | ICD-10-CM | POA: Diagnosis not present

## 2019-12-16 DIAGNOSIS — H02834 Dermatochalasis of left upper eyelid: Secondary | ICD-10-CM | POA: Diagnosis not present

## 2019-12-16 DIAGNOSIS — H524 Presbyopia: Secondary | ICD-10-CM | POA: Diagnosis not present

## 2019-12-17 DIAGNOSIS — L89154 Pressure ulcer of sacral region, stage 4: Secondary | ICD-10-CM | POA: Diagnosis not present

## 2019-12-17 DIAGNOSIS — R339 Retention of urine, unspecified: Secondary | ICD-10-CM | POA: Diagnosis not present

## 2019-12-18 DIAGNOSIS — N319 Neuromuscular dysfunction of bladder, unspecified: Secondary | ICD-10-CM | POA: Diagnosis not present

## 2019-12-18 DIAGNOSIS — L89154 Pressure ulcer of sacral region, stage 4: Secondary | ICD-10-CM | POA: Diagnosis not present

## 2019-12-18 DIAGNOSIS — G629 Polyneuropathy, unspecified: Secondary | ICD-10-CM | POA: Diagnosis not present

## 2019-12-18 DIAGNOSIS — G8929 Other chronic pain: Secondary | ICD-10-CM | POA: Diagnosis not present

## 2019-12-31 DIAGNOSIS — N319 Neuromuscular dysfunction of bladder, unspecified: Secondary | ICD-10-CM | POA: Diagnosis not present

## 2019-12-31 DIAGNOSIS — Z466 Encounter for fitting and adjustment of urinary device: Secondary | ICD-10-CM | POA: Diagnosis not present

## 2020-01-02 DIAGNOSIS — N319 Neuromuscular dysfunction of bladder, unspecified: Secondary | ICD-10-CM | POA: Diagnosis not present

## 2020-01-02 DIAGNOSIS — K5900 Constipation, unspecified: Secondary | ICD-10-CM | POA: Diagnosis not present

## 2020-01-02 DIAGNOSIS — G8929 Other chronic pain: Secondary | ICD-10-CM | POA: Diagnosis not present

## 2020-01-02 DIAGNOSIS — Z20828 Contact with and (suspected) exposure to other viral communicable diseases: Secondary | ICD-10-CM | POA: Diagnosis not present

## 2020-01-02 DIAGNOSIS — D649 Anemia, unspecified: Secondary | ICD-10-CM | POA: Diagnosis not present

## 2020-01-07 DIAGNOSIS — Z20828 Contact with and (suspected) exposure to other viral communicable diseases: Secondary | ICD-10-CM | POA: Diagnosis not present

## 2020-01-07 DIAGNOSIS — N319 Neuromuscular dysfunction of bladder, unspecified: Secondary | ICD-10-CM | POA: Diagnosis not present

## 2020-01-12 DIAGNOSIS — Z20828 Contact with and (suspected) exposure to other viral communicable diseases: Secondary | ICD-10-CM | POA: Diagnosis not present

## 2020-01-13 DIAGNOSIS — I10 Essential (primary) hypertension: Secondary | ICD-10-CM | POA: Diagnosis not present

## 2020-01-13 DIAGNOSIS — K219 Gastro-esophageal reflux disease without esophagitis: Secondary | ICD-10-CM | POA: Diagnosis not present

## 2020-01-13 DIAGNOSIS — L89154 Pressure ulcer of sacral region, stage 4: Secondary | ICD-10-CM | POA: Diagnosis not present

## 2020-01-13 DIAGNOSIS — N319 Neuromuscular dysfunction of bladder, unspecified: Secondary | ICD-10-CM | POA: Diagnosis not present

## 2020-01-15 DIAGNOSIS — Z20828 Contact with and (suspected) exposure to other viral communicable diseases: Secondary | ICD-10-CM | POA: Diagnosis not present

## 2020-01-15 DIAGNOSIS — H25041 Posterior subcapsular polar age-related cataract, right eye: Secondary | ICD-10-CM | POA: Diagnosis not present

## 2020-01-15 DIAGNOSIS — H25811 Combined forms of age-related cataract, right eye: Secondary | ICD-10-CM | POA: Diagnosis not present

## 2020-01-15 DIAGNOSIS — H2511 Age-related nuclear cataract, right eye: Secondary | ICD-10-CM | POA: Diagnosis not present

## 2020-01-19 DIAGNOSIS — K5900 Constipation, unspecified: Secondary | ICD-10-CM | POA: Diagnosis not present

## 2020-01-19 DIAGNOSIS — G629 Polyneuropathy, unspecified: Secondary | ICD-10-CM | POA: Diagnosis not present

## 2020-01-19 DIAGNOSIS — N319 Neuromuscular dysfunction of bladder, unspecified: Secondary | ICD-10-CM | POA: Diagnosis not present

## 2020-01-19 DIAGNOSIS — D649 Anemia, unspecified: Secondary | ICD-10-CM | POA: Diagnosis not present

## 2020-01-30 DIAGNOSIS — N319 Neuromuscular dysfunction of bladder, unspecified: Secondary | ICD-10-CM | POA: Diagnosis not present

## 2020-02-09 DIAGNOSIS — H2512 Age-related nuclear cataract, left eye: Secondary | ICD-10-CM | POA: Diagnosis not present

## 2020-02-09 DIAGNOSIS — H25042 Posterior subcapsular polar age-related cataract, left eye: Secondary | ICD-10-CM | POA: Diagnosis not present

## 2020-02-13 DIAGNOSIS — F329 Major depressive disorder, single episode, unspecified: Secondary | ICD-10-CM | POA: Diagnosis not present

## 2020-02-13 DIAGNOSIS — L97519 Non-pressure chronic ulcer of other part of right foot with unspecified severity: Secondary | ICD-10-CM | POA: Diagnosis not present

## 2020-02-13 DIAGNOSIS — G629 Polyneuropathy, unspecified: Secondary | ICD-10-CM | POA: Diagnosis not present

## 2020-02-19 DIAGNOSIS — H25042 Posterior subcapsular polar age-related cataract, left eye: Secondary | ICD-10-CM | POA: Diagnosis not present

## 2020-02-19 DIAGNOSIS — H2512 Age-related nuclear cataract, left eye: Secondary | ICD-10-CM | POA: Diagnosis not present

## 2020-02-19 DIAGNOSIS — H25812 Combined forms of age-related cataract, left eye: Secondary | ICD-10-CM | POA: Diagnosis not present

## 2020-02-27 DIAGNOSIS — L89154 Pressure ulcer of sacral region, stage 4: Secondary | ICD-10-CM | POA: Diagnosis not present

## 2020-02-27 DIAGNOSIS — G8929 Other chronic pain: Secondary | ICD-10-CM | POA: Diagnosis not present

## 2020-02-27 DIAGNOSIS — N319 Neuromuscular dysfunction of bladder, unspecified: Secondary | ICD-10-CM | POA: Diagnosis not present

## 2020-02-27 DIAGNOSIS — I10 Essential (primary) hypertension: Secondary | ICD-10-CM | POA: Diagnosis not present

## 2020-03-02 DIAGNOSIS — N319 Neuromuscular dysfunction of bladder, unspecified: Secondary | ICD-10-CM | POA: Diagnosis not present

## 2020-03-11 DIAGNOSIS — G629 Polyneuropathy, unspecified: Secondary | ICD-10-CM | POA: Diagnosis not present

## 2020-03-11 DIAGNOSIS — R2681 Unsteadiness on feet: Secondary | ICD-10-CM | POA: Diagnosis not present

## 2020-03-15 DIAGNOSIS — S90421A Blister (nonthermal), right great toe, initial encounter: Secondary | ICD-10-CM | POA: Diagnosis not present

## 2020-03-16 DIAGNOSIS — R5381 Other malaise: Secondary | ICD-10-CM | POA: Diagnosis not present

## 2020-03-16 DIAGNOSIS — I10 Essential (primary) hypertension: Secondary | ICD-10-CM | POA: Diagnosis not present

## 2020-03-16 DIAGNOSIS — K219 Gastro-esophageal reflux disease without esophagitis: Secondary | ICD-10-CM | POA: Diagnosis not present

## 2020-03-16 DIAGNOSIS — G629 Polyneuropathy, unspecified: Secondary | ICD-10-CM | POA: Diagnosis not present

## 2020-03-19 DIAGNOSIS — N200 Calculus of kidney: Secondary | ICD-10-CM | POA: Diagnosis not present

## 2020-03-26 DIAGNOSIS — I10 Essential (primary) hypertension: Secondary | ICD-10-CM | POA: Diagnosis not present

## 2020-03-26 DIAGNOSIS — R5381 Other malaise: Secondary | ICD-10-CM | POA: Diagnosis not present

## 2020-03-26 DIAGNOSIS — K5900 Constipation, unspecified: Secondary | ICD-10-CM | POA: Diagnosis not present

## 2020-03-29 DIAGNOSIS — N319 Neuromuscular dysfunction of bladder, unspecified: Secondary | ICD-10-CM | POA: Diagnosis not present

## 2020-04-12 DIAGNOSIS — I10 Essential (primary) hypertension: Secondary | ICD-10-CM | POA: Diagnosis not present

## 2020-04-12 DIAGNOSIS — G629 Polyneuropathy, unspecified: Secondary | ICD-10-CM | POA: Diagnosis not present

## 2020-04-12 DIAGNOSIS — N319 Neuromuscular dysfunction of bladder, unspecified: Secondary | ICD-10-CM | POA: Diagnosis not present

## 2020-04-12 DIAGNOSIS — K219 Gastro-esophageal reflux disease without esophagitis: Secondary | ICD-10-CM | POA: Diagnosis not present

## 2020-04-18 DIAGNOSIS — N136 Pyonephrosis: Secondary | ICD-10-CM | POA: Diagnosis not present

## 2020-04-18 DIAGNOSIS — Z993 Dependence on wheelchair: Secondary | ICD-10-CM | POA: Diagnosis not present

## 2020-04-18 DIAGNOSIS — N319 Neuromuscular dysfunction of bladder, unspecified: Secondary | ICD-10-CM | POA: Diagnosis not present

## 2020-04-18 DIAGNOSIS — E785 Hyperlipidemia, unspecified: Secondary | ICD-10-CM | POA: Diagnosis not present

## 2020-04-18 DIAGNOSIS — I700 Atherosclerosis of aorta: Secondary | ICD-10-CM | POA: Diagnosis not present

## 2020-04-18 DIAGNOSIS — R5381 Other malaise: Secondary | ICD-10-CM | POA: Diagnosis not present

## 2020-04-18 DIAGNOSIS — A4150 Gram-negative sepsis, unspecified: Secondary | ICD-10-CM | POA: Diagnosis not present

## 2020-04-18 DIAGNOSIS — N1330 Unspecified hydronephrosis: Secondary | ICD-10-CM | POA: Diagnosis not present

## 2020-04-18 DIAGNOSIS — R4182 Altered mental status, unspecified: Secondary | ICD-10-CM | POA: Diagnosis not present

## 2020-04-18 DIAGNOSIS — E1142 Type 2 diabetes mellitus with diabetic polyneuropathy: Secondary | ICD-10-CM | POA: Diagnosis not present

## 2020-04-18 DIAGNOSIS — Z7401 Bed confinement status: Secondary | ICD-10-CM | POA: Diagnosis not present

## 2020-04-18 DIAGNOSIS — I10 Essential (primary) hypertension: Secondary | ICD-10-CM | POA: Diagnosis not present

## 2020-04-18 DIAGNOSIS — I4510 Unspecified right bundle-branch block: Secondary | ICD-10-CM | POA: Diagnosis not present

## 2020-04-18 DIAGNOSIS — B964 Proteus (mirabilis) (morganii) as the cause of diseases classified elsewhere: Secondary | ICD-10-CM | POA: Diagnosis not present

## 2020-04-18 DIAGNOSIS — G92 Toxic encephalopathy: Secondary | ICD-10-CM | POA: Diagnosis not present

## 2020-04-18 DIAGNOSIS — R4781 Slurred speech: Secondary | ICD-10-CM | POA: Diagnosis not present

## 2020-04-18 DIAGNOSIS — R4701 Aphasia: Secondary | ICD-10-CM | POA: Diagnosis not present

## 2020-04-18 DIAGNOSIS — R0902 Hypoxemia: Secondary | ICD-10-CM | POA: Diagnosis not present

## 2020-04-18 DIAGNOSIS — N138 Other obstructive and reflux uropathy: Secondary | ICD-10-CM | POA: Diagnosis not present

## 2020-04-18 DIAGNOSIS — R509 Fever, unspecified: Secondary | ICD-10-CM | POA: Diagnosis not present

## 2020-04-18 DIAGNOSIS — M069 Rheumatoid arthritis, unspecified: Secondary | ICD-10-CM | POA: Diagnosis not present

## 2020-04-18 DIAGNOSIS — N202 Calculus of kidney with calculus of ureter: Secondary | ICD-10-CM | POA: Diagnosis not present

## 2020-04-18 DIAGNOSIS — G2581 Restless legs syndrome: Secondary | ICD-10-CM | POA: Diagnosis not present

## 2020-04-18 DIAGNOSIS — T83518A Infection and inflammatory reaction due to other urinary catheter, initial encounter: Secondary | ICD-10-CM | POA: Diagnosis not present

## 2020-04-18 DIAGNOSIS — N390 Urinary tract infection, site not specified: Secondary | ICD-10-CM | POA: Diagnosis not present

## 2020-04-18 DIAGNOSIS — B9689 Other specified bacterial agents as the cause of diseases classified elsewhere: Secondary | ICD-10-CM | POA: Diagnosis not present

## 2020-04-18 DIAGNOSIS — R000 Tachycardia, unspecified: Secondary | ICD-10-CM | POA: Diagnosis not present

## 2020-04-18 DIAGNOSIS — Z79899 Other long term (current) drug therapy: Secondary | ICD-10-CM | POA: Diagnosis not present

## 2020-04-18 DIAGNOSIS — Z960 Presence of urogenital implants: Secondary | ICD-10-CM | POA: Diagnosis not present

## 2020-04-18 DIAGNOSIS — R471 Dysarthria and anarthria: Secondary | ICD-10-CM | POA: Diagnosis not present

## 2020-04-18 DIAGNOSIS — Z9842 Cataract extraction status, left eye: Secondary | ICD-10-CM | POA: Diagnosis not present

## 2020-04-18 DIAGNOSIS — T83512A Infection and inflammatory reaction due to nephrostomy catheter, initial encounter: Secondary | ICD-10-CM | POA: Diagnosis not present

## 2020-04-18 DIAGNOSIS — Z8616 Personal history of COVID-19: Secondary | ICD-10-CM | POA: Diagnosis not present

## 2020-04-18 DIAGNOSIS — R339 Retention of urine, unspecified: Secondary | ICD-10-CM | POA: Diagnosis not present

## 2020-04-18 DIAGNOSIS — M5416 Radiculopathy, lumbar region: Secondary | ICD-10-CM | POA: Diagnosis not present

## 2020-04-18 DIAGNOSIS — I6782 Cerebral ischemia: Secondary | ICD-10-CM | POA: Diagnosis not present

## 2020-04-18 DIAGNOSIS — N10 Acute tubulo-interstitial nephritis: Secondary | ICD-10-CM | POA: Diagnosis not present

## 2020-04-18 DIAGNOSIS — A419 Sepsis, unspecified organism: Secondary | ICD-10-CM | POA: Diagnosis not present

## 2020-04-18 DIAGNOSIS — M2550 Pain in unspecified joint: Secondary | ICD-10-CM | POA: Diagnosis not present

## 2020-04-18 DIAGNOSIS — M5136 Other intervertebral disc degeneration, lumbar region: Secondary | ICD-10-CM | POA: Diagnosis not present

## 2020-04-18 DIAGNOSIS — N281 Cyst of kidney, acquired: Secondary | ICD-10-CM | POA: Diagnosis not present

## 2020-04-18 DIAGNOSIS — N200 Calculus of kidney: Secondary | ICD-10-CM | POA: Diagnosis not present

## 2020-04-18 DIAGNOSIS — N3000 Acute cystitis without hematuria: Secondary | ICD-10-CM | POA: Diagnosis not present

## 2020-04-18 DIAGNOSIS — E1165 Type 2 diabetes mellitus with hyperglycemia: Secondary | ICD-10-CM | POA: Diagnosis not present

## 2020-04-18 DIAGNOSIS — M48061 Spinal stenosis, lumbar region without neurogenic claudication: Secondary | ICD-10-CM | POA: Diagnosis not present

## 2020-04-18 DIAGNOSIS — E876 Hypokalemia: Secondary | ICD-10-CM | POA: Diagnosis not present

## 2020-04-18 DIAGNOSIS — K219 Gastro-esophageal reflux disease without esophagitis: Secondary | ICD-10-CM | POA: Diagnosis not present

## 2020-04-18 DIAGNOSIS — N132 Hydronephrosis with renal and ureteral calculous obstruction: Secondary | ICD-10-CM | POA: Diagnosis not present

## 2020-04-18 DIAGNOSIS — Z20822 Contact with and (suspected) exposure to covid-19: Secondary | ICD-10-CM | POA: Diagnosis not present

## 2020-04-18 DIAGNOSIS — K8020 Calculus of gallbladder without cholecystitis without obstruction: Secondary | ICD-10-CM | POA: Diagnosis not present

## 2020-04-18 DIAGNOSIS — F0390 Unspecified dementia without behavioral disturbance: Secondary | ICD-10-CM | POA: Diagnosis not present

## 2020-04-25 DIAGNOSIS — N202 Calculus of kidney with calculus of ureter: Secondary | ICD-10-CM | POA: Diagnosis not present

## 2020-04-26 DIAGNOSIS — N202 Calculus of kidney with calculus of ureter: Secondary | ICD-10-CM | POA: Diagnosis not present

## 2020-04-29 DIAGNOSIS — N390 Urinary tract infection, site not specified: Secondary | ICD-10-CM | POA: Diagnosis not present

## 2020-04-29 DIAGNOSIS — G629 Polyneuropathy, unspecified: Secondary | ICD-10-CM | POA: Diagnosis not present

## 2020-04-29 DIAGNOSIS — R2681 Unsteadiness on feet: Secondary | ICD-10-CM | POA: Diagnosis not present

## 2020-04-29 DIAGNOSIS — L89154 Pressure ulcer of sacral region, stage 4: Secondary | ICD-10-CM | POA: Diagnosis not present

## 2020-04-29 DIAGNOSIS — N200 Calculus of kidney: Secondary | ICD-10-CM | POA: Diagnosis not present

## 2020-04-29 DIAGNOSIS — A419 Sepsis, unspecified organism: Secondary | ICD-10-CM | POA: Diagnosis not present

## 2020-04-29 DIAGNOSIS — I10 Essential (primary) hypertension: Secondary | ICD-10-CM | POA: Diagnosis not present

## 2020-04-29 DIAGNOSIS — B964 Proteus (mirabilis) (morganii) as the cause of diseases classified elsewhere: Secondary | ICD-10-CM | POA: Diagnosis not present

## 2020-04-30 DIAGNOSIS — N202 Calculus of kidney with calculus of ureter: Secondary | ICD-10-CM | POA: Diagnosis not present

## 2020-05-01 DIAGNOSIS — Z20828 Contact with and (suspected) exposure to other viral communicable diseases: Secondary | ICD-10-CM | POA: Diagnosis not present

## 2020-05-03 DIAGNOSIS — N189 Chronic kidney disease, unspecified: Secondary | ICD-10-CM | POA: Diagnosis not present

## 2020-05-03 DIAGNOSIS — D649 Anemia, unspecified: Secondary | ICD-10-CM | POA: Diagnosis not present

## 2020-05-03 DIAGNOSIS — R05 Cough: Secondary | ICD-10-CM | POA: Diagnosis not present

## 2020-05-04 DIAGNOSIS — Z03818 Encounter for observation for suspected exposure to other biological agents ruled out: Secondary | ICD-10-CM | POA: Diagnosis not present

## 2020-05-04 DIAGNOSIS — Z20828 Contact with and (suspected) exposure to other viral communicable diseases: Secondary | ICD-10-CM | POA: Diagnosis not present

## 2020-05-05 DIAGNOSIS — I10 Essential (primary) hypertension: Secondary | ICD-10-CM | POA: Diagnosis not present

## 2020-05-05 DIAGNOSIS — N200 Calculus of kidney: Secondary | ICD-10-CM | POA: Diagnosis not present

## 2020-05-05 DIAGNOSIS — R05 Cough: Secondary | ICD-10-CM | POA: Diagnosis not present

## 2020-05-05 DIAGNOSIS — G8929 Other chronic pain: Secondary | ICD-10-CM | POA: Diagnosis not present

## 2020-05-06 DIAGNOSIS — A419 Sepsis, unspecified organism: Secondary | ICD-10-CM | POA: Diagnosis not present

## 2020-05-06 DIAGNOSIS — N202 Calculus of kidney with calculus of ureter: Secondary | ICD-10-CM | POA: Diagnosis not present

## 2020-05-06 DIAGNOSIS — R2681 Unsteadiness on feet: Secondary | ICD-10-CM | POA: Diagnosis not present

## 2020-05-08 DIAGNOSIS — N200 Calculus of kidney: Secondary | ICD-10-CM | POA: Diagnosis not present

## 2020-05-08 DIAGNOSIS — K219 Gastro-esophageal reflux disease without esophagitis: Secondary | ICD-10-CM | POA: Diagnosis not present

## 2020-05-08 DIAGNOSIS — N390 Urinary tract infection, site not specified: Secondary | ICD-10-CM | POA: Diagnosis not present

## 2020-05-08 DIAGNOSIS — R339 Retention of urine, unspecified: Secondary | ICD-10-CM | POA: Diagnosis not present

## 2020-05-13 DIAGNOSIS — N200 Calculus of kidney: Secondary | ICD-10-CM | POA: Diagnosis not present

## 2020-05-13 DIAGNOSIS — T191XXA Foreign body in bladder, initial encounter: Secondary | ICD-10-CM | POA: Diagnosis not present

## 2020-05-20 DIAGNOSIS — J208 Acute bronchitis due to other specified organisms: Secondary | ICD-10-CM | POA: Diagnosis not present

## 2020-05-24 DIAGNOSIS — R5381 Other malaise: Secondary | ICD-10-CM | POA: Diagnosis not present

## 2020-05-24 DIAGNOSIS — G8929 Other chronic pain: Secondary | ICD-10-CM | POA: Diagnosis not present

## 2020-05-24 DIAGNOSIS — I10 Essential (primary) hypertension: Secondary | ICD-10-CM | POA: Diagnosis not present

## 2020-05-24 DIAGNOSIS — J209 Acute bronchitis, unspecified: Secondary | ICD-10-CM | POA: Diagnosis not present

## 2020-05-24 DIAGNOSIS — F329 Major depressive disorder, single episode, unspecified: Secondary | ICD-10-CM | POA: Diagnosis not present

## 2020-06-02 DIAGNOSIS — N200 Calculus of kidney: Secondary | ICD-10-CM | POA: Diagnosis not present

## 2020-06-10 DIAGNOSIS — R5381 Other malaise: Secondary | ICD-10-CM | POA: Diagnosis not present

## 2020-06-10 DIAGNOSIS — I10 Essential (primary) hypertension: Secondary | ICD-10-CM | POA: Diagnosis not present

## 2020-06-10 DIAGNOSIS — F411 Generalized anxiety disorder: Secondary | ICD-10-CM | POA: Diagnosis not present

## 2020-06-10 DIAGNOSIS — K219 Gastro-esophageal reflux disease without esophagitis: Secondary | ICD-10-CM | POA: Diagnosis not present

## 2020-06-16 DIAGNOSIS — K219 Gastro-esophageal reflux disease without esophagitis: Secondary | ICD-10-CM | POA: Diagnosis not present

## 2020-06-16 DIAGNOSIS — Z9359 Other cystostomy status: Secondary | ICD-10-CM | POA: Diagnosis not present

## 2020-06-16 DIAGNOSIS — G629 Polyneuropathy, unspecified: Secondary | ICD-10-CM | POA: Diagnosis not present

## 2020-06-16 DIAGNOSIS — Z23 Encounter for immunization: Secondary | ICD-10-CM | POA: Diagnosis not present

## 2020-06-16 DIAGNOSIS — I10 Essential (primary) hypertension: Secondary | ICD-10-CM | POA: Diagnosis not present

## 2020-06-16 DIAGNOSIS — E785 Hyperlipidemia, unspecified: Secondary | ICD-10-CM | POA: Diagnosis not present

## 2020-06-17 DIAGNOSIS — N390 Urinary tract infection, site not specified: Secondary | ICD-10-CM | POA: Diagnosis not present

## 2020-06-18 DIAGNOSIS — N319 Neuromuscular dysfunction of bladder, unspecified: Secondary | ICD-10-CM | POA: Diagnosis not present

## 2020-06-18 DIAGNOSIS — G629 Polyneuropathy, unspecified: Secondary | ICD-10-CM | POA: Diagnosis not present

## 2020-06-18 DIAGNOSIS — R5381 Other malaise: Secondary | ICD-10-CM | POA: Diagnosis not present

## 2020-06-18 DIAGNOSIS — J309 Allergic rhinitis, unspecified: Secondary | ICD-10-CM | POA: Diagnosis not present

## 2020-06-18 DIAGNOSIS — N200 Calculus of kidney: Secondary | ICD-10-CM | POA: Diagnosis not present

## 2020-06-20 DIAGNOSIS — N390 Urinary tract infection, site not specified: Secondary | ICD-10-CM | POA: Diagnosis not present

## 2020-06-26 DIAGNOSIS — N390 Urinary tract infection, site not specified: Secondary | ICD-10-CM | POA: Diagnosis not present

## 2020-07-19 DIAGNOSIS — J209 Acute bronchitis, unspecified: Secondary | ICD-10-CM | POA: Diagnosis not present

## 2020-07-19 DIAGNOSIS — N390 Urinary tract infection, site not specified: Secondary | ICD-10-CM | POA: Diagnosis not present

## 2020-07-23 DIAGNOSIS — N390 Urinary tract infection, site not specified: Secondary | ICD-10-CM | POA: Diagnosis not present

## 2020-08-03 DIAGNOSIS — M6281 Muscle weakness (generalized): Secondary | ICD-10-CM | POA: Diagnosis not present

## 2020-08-03 DIAGNOSIS — A419 Sepsis, unspecified organism: Secondary | ICD-10-CM | POA: Diagnosis not present

## 2020-08-04 DIAGNOSIS — A419 Sepsis, unspecified organism: Secondary | ICD-10-CM | POA: Diagnosis not present

## 2020-08-04 DIAGNOSIS — M6281 Muscle weakness (generalized): Secondary | ICD-10-CM | POA: Diagnosis not present

## 2020-08-09 DIAGNOSIS — N200 Calculus of kidney: Secondary | ICD-10-CM | POA: Diagnosis not present

## 2020-08-09 DIAGNOSIS — A419 Sepsis, unspecified organism: Secondary | ICD-10-CM | POA: Diagnosis not present

## 2020-08-09 DIAGNOSIS — I10 Essential (primary) hypertension: Secondary | ICD-10-CM | POA: Diagnosis not present

## 2020-08-09 DIAGNOSIS — Z7689 Persons encountering health services in other specified circumstances: Secondary | ICD-10-CM | POA: Diagnosis not present

## 2020-08-09 DIAGNOSIS — R32 Unspecified urinary incontinence: Secondary | ICD-10-CM | POA: Diagnosis not present

## 2020-08-09 DIAGNOSIS — E782 Mixed hyperlipidemia: Secondary | ICD-10-CM | POA: Diagnosis not present

## 2020-08-09 DIAGNOSIS — L97519 Non-pressure chronic ulcer of other part of right foot with unspecified severity: Secondary | ICD-10-CM | POA: Diagnosis not present

## 2020-08-09 DIAGNOSIS — R269 Unspecified abnormalities of gait and mobility: Secondary | ICD-10-CM | POA: Diagnosis not present

## 2020-08-09 DIAGNOSIS — G609 Hereditary and idiopathic neuropathy, unspecified: Secondary | ICD-10-CM | POA: Diagnosis not present

## 2020-08-09 DIAGNOSIS — R159 Full incontinence of feces: Secondary | ICD-10-CM | POA: Diagnosis not present

## 2020-08-09 DIAGNOSIS — N319 Neuromuscular dysfunction of bladder, unspecified: Secondary | ICD-10-CM | POA: Diagnosis not present

## 2020-08-09 DIAGNOSIS — L89159 Pressure ulcer of sacral region, unspecified stage: Secondary | ICD-10-CM | POA: Diagnosis not present

## 2020-08-11 DIAGNOSIS — I10 Essential (primary) hypertension: Secondary | ICD-10-CM | POA: Diagnosis not present

## 2020-08-11 DIAGNOSIS — Z85828 Personal history of other malignant neoplasm of skin: Secondary | ICD-10-CM | POA: Diagnosis not present

## 2020-08-11 DIAGNOSIS — K219 Gastro-esophageal reflux disease without esophagitis: Secondary | ICD-10-CM | POA: Diagnosis not present

## 2020-08-11 DIAGNOSIS — Z960 Presence of urogenital implants: Secondary | ICD-10-CM | POA: Diagnosis not present

## 2020-08-11 DIAGNOSIS — N319 Neuromuscular dysfunction of bladder, unspecified: Secondary | ICD-10-CM | POA: Diagnosis not present

## 2020-08-11 DIAGNOSIS — Z87440 Personal history of urinary (tract) infections: Secondary | ICD-10-CM | POA: Diagnosis not present

## 2020-08-11 DIAGNOSIS — N200 Calculus of kidney: Secondary | ICD-10-CM | POA: Diagnosis not present

## 2020-08-11 DIAGNOSIS — Z6829 Body mass index (BMI) 29.0-29.9, adult: Secondary | ICD-10-CM | POA: Diagnosis not present

## 2020-08-14 DIAGNOSIS — M21371 Foot drop, right foot: Secondary | ICD-10-CM | POA: Diagnosis not present

## 2020-08-14 DIAGNOSIS — M21372 Foot drop, left foot: Secondary | ICD-10-CM | POA: Diagnosis not present

## 2020-08-14 DIAGNOSIS — G609 Hereditary and idiopathic neuropathy, unspecified: Secondary | ICD-10-CM | POA: Diagnosis not present
